# Patient Record
Sex: Female | Born: 1990 | Race: Black or African American | Hispanic: No | Marital: Single | Smoking: Former smoker
Health system: Southern US, Community
[De-identification: ages and names within clinical notes are randomized; demographics above are authoritative.]

## PROBLEM LIST (undated history)

## (undated) ENCOUNTER — Inpatient Hospital Stay (HOSPITAL_COMMUNITY): Payer: Self-pay

## (undated) ENCOUNTER — Emergency Department (HOSPITAL_COMMUNITY): Admission: EM | Payer: Medicaid Other | Source: Home / Self Care

## (undated) DIAGNOSIS — R12 Heartburn: Secondary | ICD-10-CM

## (undated) DIAGNOSIS — E079 Disorder of thyroid, unspecified: Secondary | ICD-10-CM

## (undated) DIAGNOSIS — R42 Dizziness and giddiness: Secondary | ICD-10-CM

## (undated) DIAGNOSIS — R569 Unspecified convulsions: Secondary | ICD-10-CM

## (undated) DIAGNOSIS — O139 Gestational [pregnancy-induced] hypertension without significant proteinuria, unspecified trimester: Secondary | ICD-10-CM

## (undated) DIAGNOSIS — A6000 Herpesviral infection of urogenital system, unspecified: Secondary | ICD-10-CM

## (undated) DIAGNOSIS — K5790 Diverticulosis of intestine, part unspecified, without perforation or abscess without bleeding: Secondary | ICD-10-CM

## (undated) DIAGNOSIS — M549 Dorsalgia, unspecified: Secondary | ICD-10-CM

## (undated) DIAGNOSIS — O26899 Other specified pregnancy related conditions, unspecified trimester: Secondary | ICD-10-CM

## (undated) DIAGNOSIS — D649 Anemia, unspecified: Secondary | ICD-10-CM

## (undated) DIAGNOSIS — O00109 Unspecified tubal pregnancy without intrauterine pregnancy: Secondary | ICD-10-CM

## (undated) HISTORY — DX: Unspecified tubal pregnancy without intrauterine pregnancy: O00.109

---
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
## 2010-02-02 ENCOUNTER — Emergency Department: Payer: Self-pay | Admitting: Emergency Medicine

## 2010-04-08 ENCOUNTER — Emergency Department (HOSPITAL_COMMUNITY): Admission: EM | Admit: 2010-04-08 | Discharge: 2010-04-08 | Payer: Self-pay | Admitting: Emergency Medicine

## 2010-09-15 LAB — POCT I-STAT, CHEM 8
BUN: 8 mg/dL (ref 6–23)
Calcium, Ion: 1.13 mmol/L (ref 1.12–1.32)
Chloride: 106 mEq/L (ref 96–112)
Creatinine, Ser: 0.9 mg/dL (ref 0.4–1.2)
Glucose, Bld: 89 mg/dL (ref 70–99)
HCT: 40 % (ref 36.0–46.0)
Hemoglobin: 13.6 g/dL (ref 12.0–15.0)
Potassium: 3.7 mEq/L (ref 3.5–5.1)
Sodium: 138 mEq/L (ref 135–145)
TCO2: 23 mmol/L (ref 0–100)

## 2010-09-15 LAB — POCT PREGNANCY, URINE: Preg Test, Ur: NEGATIVE

## 2010-09-15 LAB — D-DIMER, QUANTITATIVE: D-Dimer, Quant: <0.22 ug/mL-FEU (ref 0.00–0.48)

## 2010-11-15 NOTE — Op Note (Signed)
NAME:  Joann King, Joann King NO.:  [id number]   MEDICAL RECORD NO.:  [id number]          PATIENT TYPE:  INP   LOCATION:                                FACILITY:  [facility]   PHYSICIAN:  Phil D. Okey Dupre, M.D.     DATE OF BIRTH:  Feb 06, 1991   DATE OF PROCEDURE:  10/13/2007  DATE OF DISCHARGE:                               OPERATIVE REPORT   PREOPERATIVE DIAGNOSES:  1. Intrauterine pregnancy at 41 weeks 1 day gestation.  2. Cephalopelvic disproportion.  3. Arrest of labor.  4. Meconium stained amniotic fluid.   POSTOPERATIVE DIAGNOSES:  1. Intrauterine pregnancy at 41 weeks 1 day gestation.  2. Cephalopelvic disproportion.  3. Arrest of labor.  4. Meconium stained amniotic fluid.   PROCEDURE:  Primary low transverse cesarean section.   SURGEON:  Javier Glazier. Okey Dupre, MD   ASSISTANT:  Karlton Lemon, MD   ANESTHESIA:  Epidural.   FINDINGS:  1. Viable infant female weighing 7 pounds 12 ounces with Apgars 8 at      one minute and 9 at five minutes in vertex presentation.  2. Meconium stained amniotic fluid.  3. Normal female pelvic anatomy.   ESTIMATED BLOOD LOSS:  Six hundred mL.   DRAINS:  Foley with clear yellow urine.   COMPLICATIONS:  None immediate.   SPECIMENS:  1. Placenta to labor and delivery.  2. Cord pH 7.22.  3. Cord blood to cord bank.   INDICATIONS FOR PROCEDURE:  Ms. Kattner is a 20 year old gravida 1, para 0  at 41 weeks 1 day gestation that presented in spontaneous labor.  She  progressed to 9 cm dilated and remained that way for 5 plus hours.  She  is adequate on her [place] units after an IUPC was placed for almost  an hour.  She was on Pitocin at the time of decision to go for cesarean  section.   DESCRIPTION OF PROCEDURE:  The patient was taken to the operating room  and was prepped and draped in the usual sterile manner in the supine  position with left lateral uterine displacement.  After ensuring  adequate anesthesia a Pfannenstiel skin  incision was made using a  scalpel.  Incision was carried down through the subcutaneous tissues  using the scalpel.  The rectus fascia was nicked in the midline and the  incision was extended laterally in each direction using the Mayo  scissors.  The rectus muscles were dissected free of the fascia using  both sharp and blunt dissection.  The rectus muscles were then separated  bluntly.  The parietal peritoneum was identified, grasped between two  hemostats, elevated and entered sharply under direct visualization with  Metzenbaum scissors.  A bladder blade was then placed and reflection of  the visceral peritoneum superior to the bladder was identified, elevated  and incised using the Metzenbaum scissors.  The incision was then  extended laterally in each direction.  The bladder flap was created  using blunt dissection and retracted with a bladder blade.  A low  transverse uterine incision was made using a scalpel and  the incision  was extended laterally and superiorly using blunt dissection.  A hand  was placed in the uterine cavity but it was difficult to elevate the  head.  An assistant from below applied upward force to the infant's head  which helped elevate it.  The head was then elevated and flexed with my  hand and delivered without difficulty.  The infant was bulb suctioned  after the delivery of the head.  The body and the infant was then  delivered without difficulty.  Cord was doubly clamped and cut and the  infant handed to the nursery team in attendance.  Specimen was collected  for cord pH.  Cord bloods was to be collected by the cord bank.  The  placenta was then delivered manually and appeared intact.  The  endometrial cavity was then wiped free of any traces of membranes using  wet laparotomy sponges.  The edges of the uterine incision were grasped  using ring clamps and the uterine incision was closed in two layers the  first being running locking stitch of 0 Vicryl and  the second being a  running imbricating stitch of 0 Vicryl.  The uterine incision was  inspected and found to have adequate hemostasis.  The operative site was  then irrigated with copious amounts of normal saline.  The uterine  incision was inspected once again and found to have adequate hemostasis.  The rectus muscles were inspected and small areas of bleeding were  controlled using the Bovie cautery.  The rectus fascia was  reapproximated with one suture of 0 Vicryl in a running unlocked  fashion.  Small areas of bleeding in the subcutaneous tissue were  controlled using the Bovie cautery.  The skin was reapproximated with  stainless steel skin staples.  Sponge, needle and instrument counts were  correct x3.  The patient tolerated the procedure well and went to the  post anesthesia care unit in stable condition.      Karlton Lemon, MD  Electronically Signed     ______________________________  Javier Glazier. Okey Dupre, M.D.    NS/MEDQ  D:  10/13/2007  T:  10/13/2007  Job:  [id number]

## 2010-11-15 NOTE — Discharge Summary (Signed)
Joann King, Joann King                ACCOUNT NO.:  [id number]   MEDICAL RECORD NO.:  [id number]          PATIENT TYPE:  INP   LOCATION:  9123                          [facility]ACILITY:  [facility]   PHYSICIAN:  Phil D. Okey Dupre, M.D.     DATE OF BIRTH:  07-27-1990   DATE OF ADMISSION:  10/12/2007  DATE OF DISCHARGE:                               DISCHARGE SUMMARY   REASON FOR ADMISSION:  Pregnancy at term in labor.   PROCEDURE:  Joann King had a low-transverse cesarean section for  cephalopelvic disproportion.   POSTOPERATIVE DIAGNOSIS:  A low-transverse cesarean section for  cephalopelvic disproportion.   POSTPARTUM COURSE:  Uneventful.  The patient has been ambulating well,  taking p.o. fluids, and solids well without complications.   PHYSICAL EXAMINATION:  VITAL SIGNS:  Stable.  Hemoglobin was 9,  hematocrit 26.  HEART:  Heart rate is regular to rhythm and rate.  LUNGS:  Clear to auscultation bilaterally.  ABDOMEN:  Soft and nontender.  Bowel sounds are present in all 4  quadrants.  Incision is intact.  No redness.  No swelling.  No drainage.  Fundus is firm.  Lochia scant amount.  EXTREMITIES:  There is trace edema in the lower extremities.   ASSESSMENT:  Stable on postop day 3 and anemia.   PLAN:  To go ahead and discharge her home.   DISCHARGE MEDICATIONS:  As follows,  1. Chromagen Forte 1 p.o. b.i.d.  2. Motrin  600 one q.6 h. p.r.n. cramping  3. Lortab 5/325 one p.o. q.4 h. p.r.n. pain.   Baby love nurse is to take out her staples on postop day 5 through 7.      Zerita Boers, N.M.      Phil D. Okey Dupre, M.D.  Electronically Signed    DL/MEDQ  D:  16/04/9603  T:  10/15/2007  Job:  [id number]

## 2011-03-23 LAB — POCT URINALYSIS DIP (DEVICE)
Bilirubin Urine: NEGATIVE
Bilirubin Urine: NEGATIVE
Glucose, UA: NEGATIVE
Glucose, UA: NEGATIVE
Hgb urine dipstick: NEGATIVE
Hgb urine dipstick: NEGATIVE
Ketones, ur: NEGATIVE
Ketones, ur: NEGATIVE
Nitrite: NEGATIVE
Nitrite: NEGATIVE
Operator id: 159681
Operator id: 297281
Protein, ur: NEGATIVE
Protein, ur: NEGATIVE
Specific Gravity, Urine: 1.015
Specific Gravity, Urine: 1.02
Urobilinogen, UA: 0.2
Urobilinogen, UA: 0.2
pH: 7.5
pH: 7

## 2011-03-24 LAB — URINALYSIS, ROUTINE W REFLEX MICROSCOPIC
Bilirubin Urine: NEGATIVE
Glucose, UA: NEGATIVE
Ketones, ur: NEGATIVE
Leukocytes, UA: NEGATIVE
Nitrite: NEGATIVE
Protein, ur: NEGATIVE
Specific Gravity, Urine: 1.015
Urobilinogen, UA: 0.2
pH: 6

## 2011-03-24 LAB — POCT URINALYSIS DIP (DEVICE)
Bilirubin Urine: NEGATIVE
Bilirubin Urine: NEGATIVE
Glucose, UA: NEGATIVE
Glucose, UA: NEGATIVE
Hgb urine dipstick: NEGATIVE
Hgb urine dipstick: NEGATIVE
Ketones, ur: NEGATIVE
Ketones, ur: NEGATIVE
Nitrite: NEGATIVE
Nitrite: NEGATIVE
Operator id: 297281
Operator id: 297281
Protein, ur: 30 — AB
Protein, ur: NEGATIVE
Specific Gravity, Urine: 1.02
Specific Gravity, Urine: 1.015
Urobilinogen, UA: 0.2
Urobilinogen, UA: 0.2
pH: 7
pH: 7

## 2011-03-24 LAB — URINE MICROSCOPIC-ADD ON

## 2011-03-24 LAB — GC/CHLAMYDIA PROBE AMP, GENITAL
Chlamydia, DNA Probe: NEGATIVE
GC Probe Amp, Genital: NEGATIVE

## 2011-03-24 LAB — WET PREP, GENITAL
Clue Cells Wet Prep HPF POC: NONE SEEN
Trich, Wet Prep: NONE SEEN
Yeast Wet Prep HPF POC: NONE SEEN

## 2011-03-27 LAB — POCT URINALYSIS DIP (DEVICE)
Bilirubin Urine: NEGATIVE
Bilirubin Urine: NEGATIVE
Bilirubin Urine: NEGATIVE
Glucose, UA: NEGATIVE
Glucose, UA: NEGATIVE
Glucose, UA: NEGATIVE
Hgb urine dipstick: NEGATIVE
Hgb urine dipstick: NEGATIVE
Hgb urine dipstick: NEGATIVE
Ketones, ur: NEGATIVE
Ketones, ur: NEGATIVE
Ketones, ur: NEGATIVE
Nitrite: NEGATIVE
Nitrite: NEGATIVE
Nitrite: NEGATIVE
Operator id: 148111
Operator id: 120861
Operator id: 148111
Operator id: 297281
Protein, ur: 30 — AB
Protein, ur: 30 — AB
Protein, ur: NEGATIVE
Specific Gravity, Urine: 1.015
Specific Gravity, Urine: 1.01
Specific Gravity, Urine: 1.015
Specific Gravity, Urine: <=1.005
Urobilinogen, UA: 0.2
Urobilinogen, UA: 0.2
Urobilinogen, UA: 0.2
Urobilinogen, UA: 0.2
pH: 7
pH: 6.5
pH: 7
pH: 7.5

## 2011-03-28 LAB — CBC
HCT: 26.3 — ABNORMAL LOW
HCT: 32 — ABNORMAL LOW
Hemoglobin: 11 — ABNORMAL LOW
Hemoglobin: 9 — ABNORMAL LOW
MCHC: 34.3
MCHC: 34.5
MCV: 84.6
MCV: 85.3
Platelets: 307
Platelets: 388
RBC: 3.09 — ABNORMAL LOW
RBC: 3.78 — ABNORMAL LOW
RDW: 14.6
RDW: 14.8
WBC: 13.3
WBC: 13.9 — ABNORMAL HIGH

## 2011-03-28 LAB — POCT URINALYSIS DIP (DEVICE)
Bilirubin Urine: NEGATIVE
Bilirubin Urine: NEGATIVE
Glucose, UA: NEGATIVE
Glucose, UA: NEGATIVE
Hgb urine dipstick: NEGATIVE
Hgb urine dipstick: NEGATIVE
Ketones, ur: NEGATIVE
Ketones, ur: NEGATIVE
Nitrite: NEGATIVE
Nitrite: NEGATIVE
Operator id: 120861
Operator id: 159681
Protein, ur: 30 — AB
Protein, ur: NEGATIVE
Specific Gravity, Urine: 1.015
Specific Gravity, Urine: 1.015
Urobilinogen, UA: 0.2
Urobilinogen, UA: 0.2
pH: 7
pH: 7.5

## 2011-03-28 LAB — WOUND CULTURE: Gram Stain: NONE SEEN

## 2011-03-28 LAB — RPR: RPR Ser Ql: NONREACTIVE

## 2011-04-07 LAB — POCT URINALYSIS DIP (DEVICE)
Bilirubin Urine: NEGATIVE
Glucose, UA: NEGATIVE
Hgb urine dipstick: NEGATIVE
Ketones, ur: NEGATIVE
Nitrite: NEGATIVE
Operator id: 120861
Protein, ur: 30 — AB
Specific Gravity, Urine: 1.02
Urobilinogen, UA: 1
pH: 7

## 2011-04-10 LAB — POCT URINALYSIS DIP (DEVICE)
Bilirubin Urine: NEGATIVE
Glucose, UA: NEGATIVE
Hgb urine dipstick: NEGATIVE
Ketones, ur: NEGATIVE
Nitrite: NEGATIVE
Operator id: 297281
Protein, ur: NEGATIVE
Specific Gravity, Urine: 1.02
Urobilinogen, UA: 0.2
pH: 7

## 2011-04-10 LAB — WET PREP, GENITAL
Trich, Wet Prep: NONE SEEN
Yeast Wet Prep HPF POC: NONE SEEN

## 2011-04-11 LAB — GC/CHLAMYDIA PROBE AMP, GENITAL
Chlamydia, DNA Probe: POSITIVE — AB
GC Probe Amp, Genital: POSITIVE — AB

## 2011-04-11 LAB — URINALYSIS, ROUTINE W REFLEX MICROSCOPIC
Bilirubin Urine: NEGATIVE
Glucose, UA: NEGATIVE
Ketones, ur: NEGATIVE
Nitrite: NEGATIVE
Protein, ur: NEGATIVE
Specific Gravity, Urine: 1.01
Urobilinogen, UA: 0.2
pH: 7.5

## 2011-04-11 LAB — WET PREP, GENITAL: Trich, Wet Prep: NONE SEEN

## 2011-04-11 LAB — CBC
HCT: 30.8 — ABNORMAL LOW
Hemoglobin: 10.6 — ABNORMAL LOW
MCHC: 34.5
Platelets: 343
RDW: 13.6

## 2011-04-11 LAB — RUBELLA SCREEN: Rubella: 94 — ABNORMAL HIGH

## 2011-04-11 LAB — URINE MICROSCOPIC-ADD ON

## 2011-04-11 LAB — DIFFERENTIAL
Basophils Absolute: 0.1
Basophils Relative: 1
Eosinophils Absolute: 0.1 — ABNORMAL LOW
Eosinophils Relative: 1
Monocytes Absolute: 0.4

## 2011-04-11 LAB — HEPATITIS B SURFACE ANTIGEN: Hepatitis B Surface Ag: NEGATIVE

## 2012-01-11 ENCOUNTER — Emergency Department (HOSPITAL_COMMUNITY)
Admission: EM | Admit: 2012-01-11 | Discharge: 2012-01-11 | Disposition: A | Discharge status: Home / Self Care | Payer: Self-pay | Attending: Emergency Medicine | Admitting: Emergency Medicine

## 2012-01-11 ENCOUNTER — Encounter (HOSPITAL_COMMUNITY): Payer: Self-pay | Admitting: Emergency Medicine

## 2012-01-11 DIAGNOSIS — K0889 Other specified disorders of teeth and supporting structures: Secondary | ICD-10-CM

## 2012-01-11 DIAGNOSIS — K089 Disorder of teeth and supporting structures, unspecified: Secondary | ICD-10-CM | POA: Insufficient documentation

## 2012-01-11 DIAGNOSIS — F17200 Nicotine dependence, unspecified, uncomplicated: Secondary | ICD-10-CM | POA: Insufficient documentation

## 2012-01-11 HISTORY — DX: Herpesviral infection of urogenital system, unspecified: A60.00

## 2012-01-11 MED ORDER — PENICILLIN V POTASSIUM 500 MG PO TABS: 500.0000 mg | ORAL_TABLET | Freq: Four times a day (QID) | ORAL | Status: AC

## 2012-01-11 MED ORDER — IBUPROFEN 400 MG PO TABS
800.0000 mg | ORAL_TABLET | Freq: Once | ORAL | Status: AC
  Administered 2012-01-11: 800 mg via ORAL

## 2012-01-11 MED ORDER — IBUPROFEN 400 MG PO TABS
ORAL_TABLET | ORAL | Status: AC
  Filled 2012-01-11: qty 2

## 2012-01-11 MED ORDER — HYDROCODONE-ACETAMINOPHEN 5-325 MG PO TABS
1.0000 | ORAL_TABLET | Freq: Once | ORAL | Status: AC
  Administered 2012-01-11: 1 via ORAL

## 2012-01-11 MED ORDER — IBUPROFEN 800 MG PO TABS: 800.0000 mg | ORAL_TABLET | Freq: Three times a day (TID) | ORAL | Status: AC | PRN

## 2012-01-11 MED ORDER — HYDROCODONE-ACETAMINOPHEN 5-325 MG PO TABS: 1.0000 | ORAL_TABLET | Freq: Four times a day (QID) | ORAL | Status: AC | PRN

## 2012-01-11 MED ORDER — HYDROCODONE-ACETAMINOPHEN 5-325 MG PO TABS
ORAL_TABLET | ORAL | Status: AC
  Filled 2012-01-11: qty 1

## 2012-01-11 NOTE — ED Notes (Signed)
Patient with left sided face pain, throbbing in upper jaw on left.

## 2012-01-11 NOTE — ED Provider Notes (Signed)
History     CSN: [id number]  Arrival date & time 01/11/12  2222   First MD Initiated Contact with Patient 01/11/12 2246      Chief Complaint  Patient presents with  . Dental Pain    (Consider location/radiation/quality/duration/timing/severity/associated sxs/prior treatment) HPI Patient presents emergency room with left-sided dental pain.  Patient, states that both her upper and lower teeth are sensitive and painful.  States, that she has had multiple cavities filled on the side of her mouth.  Patient, states palpation on her face the pain worse.  Patient denies taking anything prior to arrival for her pain Past Medical History  Diagnosis Date  . Herpes genitalia     History reviewed. No pertinent past surgical history.  History reviewed. No pertinent family history.  History  Substance Use Topics  . Smoking status: Current Everyday Smoker  . Smokeless tobacco: Not on file  . Alcohol Use: No    OB History    Grav Para Term Preterm Abortions TAB SAB Ect Mult Living                  Review of Systems All other systems negative except as documented in the HPI. All pertinent positives and negatives as reviewed in the HPI.  Allergies  Review of patient's allergies indicates no known allergies.  Home Medications  No current outpatient prescriptions on file.  BP 131/80  Pulse 84  Temp 98.1 F (36.7 C) (Oral)  Resp 18  SpO2 99%  LMP 01/10/2012  Physical Exam  Nursing note and vitals reviewed. Constitutional: She appears well-developed and well-nourished.  HENT:  Head:    Neck: Normal range of motion. Neck supple.  Lymphadenopathy:    She has no cervical adenopathy.    ED Course  Procedures (including critical care time)  Patient be referred to dentistry.  She is told to return here for any worsening in her condition.    MDM          Carlyle Dolly, PA-C 01/11/12 [phone]

## 2012-01-12 NOTE — ED Provider Notes (Signed)
Medical screening examination/treatment/procedure(s) were performed by non-physician practitioner and as supervising physician I was immediately available for consultation/collaboration.   Yarnell Kozloski, MD 01/12/12 0650 

## 2013-02-15 ENCOUNTER — Encounter (HOSPITAL_COMMUNITY): Payer: Self-pay | Admitting: *Deleted

## 2013-02-15 ENCOUNTER — Inpatient Hospital Stay (HOSPITAL_COMMUNITY)
Admission: AD | Admit: 2013-02-15 | Discharge: 2013-02-15 | Disposition: A | Discharge status: Home / Self Care | Payer: Medicaid Other | Source: Ambulatory Visit | Attending: Obstetrics & Gynecology | Admitting: Obstetrics & Gynecology

## 2013-02-15 DIAGNOSIS — Z3201 Encounter for pregnancy test, result positive: Secondary | ICD-10-CM

## 2013-02-15 LAB — POCT PREGNANCY, URINE: Preg Test, Ur: POSITIVE — AB

## 2013-02-15 NOTE — MAU Provider Note (Signed)
  History     CSN: [id number]  Arrival date and time: 02/15/13 1501   First Provider Initiated Contact with Patient 02/15/13 1539      Chief Complaint  Patient presents with  . Possible Pregnancy   HPI Joann King 21 y.o. [redacted]w[redacted]d  Comes to MAU for pregnancy verification.  No other complaints.  OB History   Grav Para Term Preterm Abortions TAB SAB Ect Mult Living   1               Past Medical History  Diagnosis Date  . Herpes genitalia     No past surgical history on file.  No family history on file.  History  Substance Use Topics  . Smoking status: Current Every Day Smoker  . Smokeless tobacco: Not on file  . Alcohol Use: No    Allergies: No Known Allergies  No prescriptions prior to admission    Review of Systems  Constitutional: Negative for fever.  Gastrointestinal: Negative for nausea, vomiting and abdominal pain.  Genitourinary:       No vaginal discharge. No vaginal bleeding. No dysuria.   Physical Exam   Blood pressure 120/63, pulse 75, temperature 98.1 F (36.7 C), temperature source Oral, resp. rate 18, height 5\' 6"  (1.676 m), weight 186 lb 9.6 oz (84.641 kg), last menstrual period 01/09/2013.  Physical Exam  Nursing note and vitals reviewed. Constitutional: She is oriented to person, place, and time. She appears well-developed and well-nourished.  HENT:  Head: Normocephalic.  Eyes: EOM are normal.  Neck: Neck supple.  Musculoskeletal: Normal range of motion.  Neurological: She is alert and oriented to person, place, and time.  Skin: Skin is warm and dry.  Psychiatric: She has a normal mood and affect.    MAU Course  Procedures  MDM Results for orders placed during the hospital encounter of 02/15/13 (from the past 24 hour(s))  POCT PREGNANCY, URINE     Status: Abnormal   Collection Time    02/15/13  3:19 PM      Result Value Range   Preg Test, Ur POSITIVE (*) NEGATIVE     Assessment and Plan  Positive pregnancy  test  Plan Your pregnancy test is positive.  No smoking, no drugs, no alcohol.  Take a prenatal vitamin one by mouth every day.  Eat small frequent snacks to avoid nausea.  Begin prenatal care as soon as possible.  Glenroy Crossen 02/15/2013, 3:40 PM

## 2013-02-15 NOTE — MAU Note (Signed)
Pt just wants to confirm that she is pregnant. No other complaints.

## 2013-02-24 ENCOUNTER — Emergency Department (HOSPITAL_COMMUNITY)
Admission: EM | Admit: 2013-02-24 | Discharge: 2013-02-24 | Discharge status: Against Medical Advice | Payer: Medicaid Other | Attending: Emergency Medicine | Admitting: Emergency Medicine

## 2013-02-24 ENCOUNTER — Emergency Department (HOSPITAL_COMMUNITY): Payer: Medicaid Other

## 2013-02-24 ENCOUNTER — Encounter (HOSPITAL_COMMUNITY): Payer: Self-pay | Admitting: Emergency Medicine

## 2013-02-24 DIAGNOSIS — O99330 Smoking (tobacco) complicating pregnancy, unspecified trimester: Secondary | ICD-10-CM | POA: Insufficient documentation

## 2013-02-24 DIAGNOSIS — IMO0002 Reserved for concepts with insufficient information to code with codable children: Secondary | ICD-10-CM | POA: Insufficient documentation

## 2013-02-24 DIAGNOSIS — O9989 Other specified diseases and conditions complicating pregnancy, childbirth and the puerperium: Secondary | ICD-10-CM | POA: Insufficient documentation

## 2013-02-24 DIAGNOSIS — S6990XA Unspecified injury of unspecified wrist, hand and finger(s), initial encounter: Secondary | ICD-10-CM | POA: Insufficient documentation

## 2013-02-24 NOTE — ED Notes (Signed)
Pt decided she did not want to wait to be seen.  LWOT

## 2013-02-24 NOTE — ED Notes (Signed)
Above note by Paulene Floor RN supervised by Gennie Alma RN
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
## 2013-02-28 ENCOUNTER — Encounter (HOSPITAL_COMMUNITY): Payer: Self-pay | Admitting: *Deleted

## 2013-02-28 ENCOUNTER — Emergency Department (HOSPITAL_COMMUNITY)
Admission: EM | Admit: 2013-02-28 | Discharge: 2013-02-28 | Disposition: A | Discharge status: Home / Self Care | Payer: Medicaid Other | Source: Home / Self Care | Attending: Family Medicine | Admitting: Family Medicine

## 2013-02-28 DIAGNOSIS — N760 Acute vaginitis: Secondary | ICD-10-CM

## 2013-02-28 MED ORDER — NYSTATIN 100000 UNIT/GM EX CREA: TOPICAL_CREAM | CUTANEOUS | Status: DC

## 2013-02-28 NOTE — ED Notes (Signed)
Pt  Saws  She  Is  About  6  Weeks  preg     She  States  She  Is  On  Prenatal  Vitamins  But  Has  Not  Had  Any  Prenatal  Care           She  Reports    Some  Itching  And  Irritation in  Vaginal  Area   She  Denies   Any  Cramping or  Bleeding    She  Appears  In no  Acute  Distress    Sitting  Upright  On  Exam table  No  Distress

## 2013-02-28 NOTE — ED Provider Notes (Signed)
CSN: [id number]     Arrival date & time 02/28/13  1120 History   First MD Initiated Contact with Patient 02/28/13 1137     Chief Complaint  Patient presents with  . Vaginal Itching   (Consider location/radiation/quality/duration/timing/severity/associated sxs/prior Treatment) HPI Comments: 22 year old female G2 P2 001 as per her report. Currently in her first trimester of pregnancy. Here complaining of the low vaginal itchiness since last night. Denies burning on urination or urinary frequency. Denies abdominal pain or pelvic pain or pressure. Denies vaginal discharge or vaginal bleeding. Patient does have a history of genital herpes and has taken those likely be her prophylactically for long time states that she ran out about a week ago. She has a OB appointment at [facility] hospital in October. Reports otherwise doing well.   Past Medical History  Diagnosis Date  . Herpes genitalia    History reviewed. No pertinent past surgical history. History reviewed. No pertinent family history. History  Substance Use Topics  . Smoking status: Current Every Day Smoker  . Smokeless tobacco: Not on file  . Alcohol Use: No   OB History   Grav Para Term Preterm Abortions TAB SAB Ect Mult Living   1              Review of Systems  Constitutional: Negative for fever, chills, diaphoresis, activity change, appetite change and fatigue.  Gastrointestinal: Negative for nausea, vomiting, abdominal pain and diarrhea.  Genitourinary: Negative for dysuria, frequency, hematuria, flank pain, vaginal bleeding, vaginal discharge, difficulty urinating and pelvic pain.  Neurological: Negative for dizziness and headaches.  All other systems reviewed and are negative.    Allergies  Review of patient's allergies indicates no known allergies.  Home Medications   Current Outpatient Rx  Name  Route  Sig  Dispense  Refill  . nystatin cream (MYCOSTATIN)      Apply to affected area 2 times daily   15 g   0      BP 125/53  Pulse 79  Temp(Src) 98.7 F (37.1 C) (Oral)  Resp 18  SpO2 100%  LMP 01/09/2013 Physical Exam  Vitals reviewed. Constitutional: She is oriented to person, place, and time. She appears well-developed and well-nourished. No distress.  HENT:  Head: Normocephalic and atraumatic.  Eyes: No scleral icterus.  Cardiovascular: Normal heart sounds.   Pulmonary/Chest: Breath sounds normal.  Abdominal: There is no tenderness.  Genitourinary:    There is rash on the right labia. There is rash on the left labia. There is erythema around the vagina. No vaginal discharge found.  Lymphadenopathy:    She has no cervical adenopathy.  Neurological: She is alert and oriented to person, place, and time.  Skin: No rash noted. She is not diaphoretic.    ED Course  Procedures (including critical care time) Labs Review Labs Reviewed - No data to display Imaging Review No results found.  MDM   1. Vulvovaginitis    Treated with nystatin cream. Supportive care and red flags that should prompt her return to medical attention discussed with patient and provided in writing.  Sharin Grave, MD 02/28/13 1227

## 2013-03-05 ENCOUNTER — Encounter (HOSPITAL_COMMUNITY): Payer: Self-pay

## 2013-03-05 ENCOUNTER — Inpatient Hospital Stay (HOSPITAL_COMMUNITY)
Admission: AD | Admit: 2013-03-05 | Discharge: 2013-03-05 | Disposition: A | Discharge status: Home / Self Care | Payer: Medicaid Other | Source: Ambulatory Visit | Attending: Obstetrics & Gynecology | Admitting: Obstetrics & Gynecology

## 2013-03-05 DIAGNOSIS — O219 Vomiting of pregnancy, unspecified: Secondary | ICD-10-CM

## 2013-03-05 DIAGNOSIS — R42 Dizziness and giddiness: Secondary | ICD-10-CM | POA: Insufficient documentation

## 2013-03-05 DIAGNOSIS — O210 Mild hyperemesis gravidarum: Secondary | ICD-10-CM | POA: Insufficient documentation

## 2013-03-05 HISTORY — DX: Anemia, unspecified: D64.9

## 2013-03-05 LAB — COMPREHENSIVE METABOLIC PANEL
AST: 17 U/L (ref 0–37)
Albumin: 4 g/dL (ref 3.5–5.2)
Alkaline Phosphatase: 56 U/L (ref 39–117)
BUN: 8 mg/dL (ref 6–23)
Chloride: 102 mEq/L (ref 96–112)
Potassium: 4 mEq/L (ref 3.5–5.1)
Total Bilirubin: 0.6 mg/dL (ref 0.3–1.2)

## 2013-03-05 LAB — URINE MICROSCOPIC-ADD ON

## 2013-03-05 LAB — CBC
HCT: 37.1 % (ref 36.0–46.0)
Platelets: 309 10*3/uL (ref 150–400)
RBC: 4.26 MIL/uL (ref 3.87–5.11)
RDW: 12.9 % (ref 11.5–15.5)
WBC: 9.1 10*3/uL (ref 4.0–10.5)

## 2013-03-05 LAB — URINALYSIS, ROUTINE W REFLEX MICROSCOPIC
Ketones, ur: 15 mg/dL — AB
Nitrite: NEGATIVE
Specific Gravity, Urine: 1.015 (ref 1.005–1.030)
pH: 8 (ref 5.0–8.0)

## 2013-03-05 MED ORDER — ONDANSETRON HCL 4 MG PO TABS: 4.0000 mg | ORAL_TABLET | Freq: Three times a day (TID) | ORAL | Status: DC | PRN

## 2013-03-05 MED ORDER — ONDANSETRON 8 MG PO TBDP
8.0000 mg | ORAL_TABLET | Freq: Once | ORAL | Status: AC
  Administered 2013-03-05: 8 mg via ORAL
  Filled 2013-03-05: qty 1

## 2013-03-05 NOTE — MAU Provider Note (Signed)
Chief Complaint: Dizziness, Nausea and Emesis  First Provider Initiated Contact with Patient 03/05/13 2330     SUBJECTIVE HPI: Joann King is a 22 y.o. G2P1001 at [redacted]w[redacted]d by LMP who presents to maternity admissions by EMS with nausea, vomiting and dizziness times a few days. Denies syncope. Has not started prenatal care. Has not taken anything for the nausea and vomiting. States she vomited approximately 7 times in the past 24 hours has been able to keep down food and fluids. Wants a prescription for an antiemetic.  Past Medical History  Diagnosis Date  . Herpes genitalia     doesn't think ever had outbreak  . Anemia     during first pregnancy   OB History  Gravida Para Term Preterm AB SAB TAB Ectopic Multiple Living  2 1 1       1     # Outcome Date GA Lbr Len/2nd Weight Sex Delivery Anes PTL Lv  2 CUR           1 TRM 10/12/07     LTCS   Y     Comments: c/section for CPD     Past Surgical History  Procedure Laterality Date  . Cesarean section     History   Social History  . Marital Status: Single    Spouse Name: N/A    Number of Children: N/A  . Years of Education: N/A   Occupational History  . Not on file.   Social History Main Topics  . Smoking status: Never Smoker   . Smokeless tobacco: Not on file  . Alcohol Use: No  . Drug Use: No  . Sexual Activity: Yes   Other Topics Concern  . Not on file   Social History Narrative  . No narrative on file   No current facility-administered medications on file prior to encounter.   Current Outpatient Prescriptions on File Prior to Encounter  Medication Sig Dispense Refill  . nystatin cream (MYCOSTATIN) Apply to affected area 2 times daily  15 g  0   No Known Allergies  ROS: Pertinent items in HPI. Negative for fever, chills, abdominal pain, vaginal bleeding, vaginal discharge, chest pain, shortness of breath, palpitations, headache or syncope.  OBJECTIVE Blood pressure 124/58, pulse 79, resp. rate 16, height 5\' 6"   (1.676 m), weight 80.91 kg (178 lb 6 oz), last menstrual period 01/09/2013, SpO2 100.00%. GENERAL: Well-developed, well-nourished female in no acute distress. Ambulating without difficulty. HEENT: Normocephalic HEART: normal rate RESP: normal effort ABDOMEN: Soft, non-tender NEURO: Alert and oriented SPECULUM EXAM: Deferred  LAB RESULTS Results for orders placed during the hospital encounter of 03/05/13 (from the past 24 hour(s))  URINALYSIS, ROUTINE W REFLEX MICROSCOPIC     Status: Abnormal   Collection Time    03/05/13  8:35 PM      Result Value Range   Color, Urine YELLOW  YELLOW   APPearance CLEAR  CLEAR   Specific Gravity, Urine 1.015  1.005 - 1.030   pH 8.0  5.0 - 8.0   Glucose, UA NEGATIVE  NEGATIVE mg/dL   Hgb urine dipstick TRACE (*) NEGATIVE   Bilirubin Urine NEGATIVE  NEGATIVE   Ketones, ur 15 (*) NEGATIVE mg/dL   Protein, ur NEGATIVE  NEGATIVE mg/dL   Urobilinogen, UA 0.2  0.0 - 1.0 mg/dL   Nitrite NEGATIVE  NEGATIVE   Leukocytes, UA SMALL (*) NEGATIVE  URINE MICROSCOPIC-ADD ON     Status: Abnormal   Collection Time    03/05/13  8:35  PM      Result Value Range   Squamous Epithelial / LPF FEW (*) RARE   WBC, UA 3-6  <3 WBC/hpf   RBC / HPF 3-6  <3 RBC/hpf   Bacteria, UA FEW (*) RARE  CBC     Status: None   Collection Time    03/05/13 10:40 PM      Result Value Range   WBC 9.1  4.0 - 10.5 K/uL   RBC 4.26  3.87 - 5.11 MIL/uL   Hemoglobin 13.0  12.0 - 15.0 g/dL   HCT 16.1  09.6 - 04.5 %   MCV 87.1  78.0 - 100.0 fL   MCH 30.5  26.0 - 34.0 pg   MCHC 35.0  30.0 - 36.0 g/dL   RDW 40.9  81.1 - 91.4 %   Platelets 309  150 - 400 K/uL  COMPREHENSIVE METABOLIC PANEL     Status: None   Collection Time    03/05/13 10:40 PM      Result Value Range   Sodium 137  135 - 145 mEq/L   Potassium 4.0  3.5 - 5.1 mEq/L   Chloride 102  96 - 112 mEq/L   CO2 23  19 - 32 mEq/L   Glucose, Bld 77  70 - 99 mg/dL   BUN 8  6 - 23 mg/dL   Creatinine, Ser 7.82  0.50 - 1.10 mg/dL    Calcium 95.6  8.4 - 10.5 mg/dL   Total Protein 7.4  6.0 - 8.3 g/dL   Albumin 4.0  3.5 - 5.2 g/dL   AST 17  0 - 37 U/L   ALT 12  0 - 35 U/L   Alkaline Phosphatase 56  39 - 117 U/L   Total Bilirubin 0.6  0.3 - 1.2 mg/dL   GFR calc non Af Amer >90  >90 mL/min   GFR calc Af Amer >90  >90 mL/min    IMAGING N/A  MAU COURSE Zofran given. Patient keeping down liquids while in MAU. No vomiting. Requesting discharge without waiting to see if Zofran helps. No dizziness currently.  ASSESSMENT 1. Nausea and vomiting in pregnancy prior to [redacted] weeks gestation   2. Dizziness    PLAN Discharge home in stable condition. Encouraged small frequent snacks and drinks. Advance diet slowly. Dizziness precautions.     Follow-up Information   Follow up with [facility].      Follow up with [facility] [place] [facility]. (As needed in emergency)    Contact information:   [address] [id number] [place] [address] [phone]       Medication List         nystatin cream  Commonly known as:  MYCOSTATIN  Apply to affected area 2 times daily     ondansetron 4 MG tablet  Commonly known as:  ZOFRAN  Take 1 tablet (4 mg total) by mouth every 8 (eight) hours as needed for nausea.     prenatal multivitamin Tabs tablet  Take 1 tablet by mouth daily at 12 noon.       [place], [place] 03/05/2013  11:37 PM

## 2013-03-05 NOTE — MAU Note (Signed)
Patient is in with by ems with c/o n/v and dizziness for a few days. She denies vaginal bleeding or abdominal pain. Patient states that she does not have any anti-emetic

## 2013-03-07 LAB — URINE CULTURE

## 2013-03-10 ENCOUNTER — Inpatient Hospital Stay (HOSPITAL_COMMUNITY)
Admission: AD | Admit: 2013-03-10 | Discharge: 2013-03-10 | Disposition: A | Discharge status: Home / Self Care | Payer: Medicaid Other | Source: Ambulatory Visit | Attending: Obstetrics & Gynecology | Admitting: Obstetrics & Gynecology

## 2013-03-10 ENCOUNTER — Encounter (HOSPITAL_COMMUNITY): Payer: Self-pay | Admitting: *Deleted

## 2013-03-10 DIAGNOSIS — O99891 Other specified diseases and conditions complicating pregnancy: Secondary | ICD-10-CM | POA: Insufficient documentation

## 2013-03-10 DIAGNOSIS — K5900 Constipation, unspecified: Secondary | ICD-10-CM

## 2013-03-10 MED ORDER — FLEET ENEMA 7-19 GM/118ML RE ENEM
1.0000 | ENEMA | Freq: Once | RECTAL | Status: AC
  Administered 2013-03-10: 1 via RECTAL

## 2013-03-10 MED ORDER — PROMETHAZINE HCL 25 MG PO TABS: 25.0000 mg | ORAL_TABLET | Freq: Four times a day (QID) | ORAL | Status: DC | PRN

## 2013-03-10 NOTE — MAU Note (Signed)
PT SAYS  IT HURTS TO HAVE BM-  LAST ONE WAS YESTERDAY AT 3 PM.-   HARD, LARGE   .

## 2013-03-10 NOTE — MAU Provider Note (Signed)
  History     CSN: [id number]  Arrival date and time: 03/10/13 2004   None     No chief complaint on file.  HPI Comments: Joann King 22 y.o. G2P1001 presents to MAU with painful constipation. She has been on Zofran for nausea in pregnancy. She was able to go yesterday and now just has gas.        Past Medical History  Diagnosis Date  . Herpes genitalia     doesn't think ever had outbreak  . Anemia     during first pregnancy    Past Surgical History  Procedure Laterality Date  . Cesarean section      History reviewed. No pertinent family history.  History  Substance Use Topics  . Smoking status: Never Smoker   . Smokeless tobacco: Not on file  . Alcohol Use: No    Allergies: No Known Allergies  Prescriptions prior to admission  Medication Sig Dispense Refill  . nystatin cream (MYCOSTATIN) Apply to affected area 2 times daily  15 g  0  . ondansetron (ZOFRAN) 4 MG tablet Take 1 tablet (4 mg total) by mouth every 8 (eight) hours as needed for nausea.  30 tablet  3  . Prenatal Vit-Fe Fumarate-FA (PRENATAL MULTIVITAMIN) TABS tablet Take 1 tablet by mouth daily at 12 noon.        Review of Systems  Constitutional: Negative.   HENT: Negative.   Eyes: Negative.   Respiratory: Negative.   Cardiovascular: Negative.   Gastrointestinal: Positive for constipation.  Genitourinary: Negative.   Musculoskeletal: Negative.   Skin: Negative.   Neurological: Negative.   Psychiatric/Behavioral: Negative.    Physical Exam   Blood pressure 113/61, pulse 81, temperature 98.3 F (36.8 C), temperature source Oral, resp. rate 20, height 5\' 6"  (1.676 m), weight 186 lb (84.369 kg), last menstrual period 01/09/2013.  Physical Exam  Constitutional: She is oriented to person, place, and time. She appears well-developed and well-nourished.  HENT:  Head: Normocephalic and atraumatic.  Eyes: Pupils are equal, round, and reactive to light.  Cardiovascular: Normal rate, regular  rhythm and normal heart sounds.   Respiratory: Effort normal and breath sounds normal.  GI: Soft. Bowel sounds are normal. She exhibits no distension and no mass. There is tenderness. There is no rebound and no guarding.  Genitourinary:  Not examined  Musculoskeletal: Normal range of motion.  Neurological: She is alert and oriented to person, place, and time. She has normal reflexes.  Skin: Skin is warm and dry.  Psychiatric: She has a normal mood and affect.    MAU Course  Procedures  MDM Fleets Enema   Assessment and Plan   A: Constipation in Pregnancy  P: Stop Zofran  Start Phenergan 25 mg po q6 hours prn Increase fluids and fiber Stop sodas  Carolynn Serve 03/10/2013, 8:54 PM

## 2013-03-27 LAB — OB RESULTS CONSOLE VARICELLA ZOSTER ANTIBODY, IGG: Varicella: IMMUNE

## 2013-03-27 LAB — OB RESULTS CONSOLE RUBELLA ANTIBODY, IGM: RUBELLA: IMMUNE

## 2013-04-08 ENCOUNTER — Encounter: Payer: Medicaid Other | Admitting: Obstetrics and Gynecology

## 2013-04-08 ENCOUNTER — Encounter: Payer: Self-pay | Admitting: Obstetrics and Gynecology

## 2013-04-11 ENCOUNTER — Encounter: Payer: Self-pay | Admitting: *Deleted

## 2013-04-24 ENCOUNTER — Encounter: Payer: Self-pay | Admitting: *Deleted

## 2013-04-28 ENCOUNTER — Other Ambulatory Visit (HOSPITAL_COMMUNITY): Payer: Self-pay | Admitting: Physician Assistant

## 2013-04-28 DIAGNOSIS — Z3689 Encounter for other specified antenatal screening: Secondary | ICD-10-CM

## 2013-05-01 ENCOUNTER — Ambulatory Visit (HOSPITAL_COMMUNITY): Payer: Medicaid Other

## 2013-05-19 ENCOUNTER — Ambulatory Visit (HOSPITAL_COMMUNITY)
Admission: RE | Admit: 2013-05-19 | Discharge: 2013-05-19 | Disposition: A | Discharge status: Home / Self Care | Payer: Medicaid Other | Source: Ambulatory Visit | Attending: Physician Assistant | Admitting: Physician Assistant

## 2013-05-19 ENCOUNTER — Other Ambulatory Visit (HOSPITAL_COMMUNITY): Payer: Self-pay | Admitting: Physician Assistant

## 2013-05-19 DIAGNOSIS — Z3689 Encounter for other specified antenatal screening: Secondary | ICD-10-CM

## 2013-05-20 ENCOUNTER — Other Ambulatory Visit (HOSPITAL_COMMUNITY): Payer: Self-pay | Admitting: Physician Assistant

## 2013-05-20 DIAGNOSIS — Z3689 Encounter for other specified antenatal screening: Secondary | ICD-10-CM

## 2013-05-26 ENCOUNTER — Ambulatory Visit (HOSPITAL_COMMUNITY): Payer: Medicaid Other

## 2013-06-05 ENCOUNTER — Ambulatory Visit (HOSPITAL_COMMUNITY): Payer: Medicaid Other

## 2013-06-13 ENCOUNTER — Encounter (HOSPITAL_COMMUNITY): Payer: Self-pay

## 2013-06-13 ENCOUNTER — Ambulatory Visit (HOSPITAL_COMMUNITY)
Admission: RE | Admit: 2013-06-13 | Discharge: 2013-06-13 | Disposition: A | Discharge status: Home / Self Care | Payer: Medicaid Other | Source: Ambulatory Visit | Attending: Physician Assistant | Admitting: Physician Assistant

## 2013-06-13 DIAGNOSIS — Z3689 Encounter for other specified antenatal screening: Secondary | ICD-10-CM | POA: Insufficient documentation

## 2013-06-13 DIAGNOSIS — O34219 Maternal care for unspecified type scar from previous cesarean delivery: Secondary | ICD-10-CM | POA: Insufficient documentation

## 2013-06-13 NOTE — Progress Notes (Signed)
[name] was seen for ultrasound appointment today.  Please see [facility] report for details.  

## 2013-08-03 ENCOUNTER — Inpatient Hospital Stay (HOSPITAL_COMMUNITY)
Admission: AD | Admit: 2013-08-03 | Discharge: 2013-08-03 | Disposition: A | Discharge status: Home / Self Care | Payer: Medicaid Other | Source: Ambulatory Visit | Attending: Family Medicine | Admitting: Family Medicine

## 2013-08-03 ENCOUNTER — Encounter (HOSPITAL_COMMUNITY): Payer: Self-pay | Admitting: *Deleted

## 2013-08-03 DIAGNOSIS — O212 Late vomiting of pregnancy: Secondary | ICD-10-CM | POA: Insufficient documentation

## 2013-08-03 DIAGNOSIS — O219 Vomiting of pregnancy, unspecified: Secondary | ICD-10-CM

## 2013-08-03 DIAGNOSIS — R51 Headache: Secondary | ICD-10-CM | POA: Insufficient documentation

## 2013-08-03 DIAGNOSIS — R42 Dizziness and giddiness: Secondary | ICD-10-CM | POA: Insufficient documentation

## 2013-08-03 LAB — URINE MICROSCOPIC-ADD ON

## 2013-08-03 LAB — URINALYSIS, ROUTINE W REFLEX MICROSCOPIC
Bilirubin Urine: NEGATIVE
GLUCOSE, UA: NEGATIVE mg/dL
HGB URINE DIPSTICK: NEGATIVE
Ketones, ur: NEGATIVE mg/dL
Nitrite: NEGATIVE
Protein, ur: NEGATIVE mg/dL
SPECIFIC GRAVITY, URINE: 1.025 (ref 1.005–1.030)
UROBILINOGEN UA: 1 mg/dL (ref 0.0–1.0)
pH: 7 (ref 5.0–8.0)

## 2013-08-03 MED ORDER — BUTALBITAL-APAP-CAFFEINE 50-325-40 MG PO TABS
2.0000 | ORAL_TABLET | Freq: Once | ORAL | Status: AC
  Administered 2013-08-03: 2 via ORAL
  Filled 2013-08-03: qty 2

## 2013-08-03 MED ORDER — ONDANSETRON HCL 4 MG PO TABS
8.0000 mg | ORAL_TABLET | Freq: Once | ORAL | Status: AC
  Administered 2013-08-03: 8 mg via ORAL
  Filled 2013-08-03: qty 2

## 2013-08-03 MED ORDER — ONDANSETRON 8 MG PO TBDP: 8.0000 mg | ORAL_TABLET | Freq: Two times a day (BID) | ORAL | Status: DC | PRN

## 2013-08-03 NOTE — MAU Provider Note (Signed)
Attestation of Attending Supervision of Advanced Practitioner (PA/CNM/NP): Evaluation and management procedures were performed by the Advanced Practitioner under my supervision and collaboration.  I have reviewed the Advanced Practitioner's note and chart, and I agree with the management and plan.  Reva BoresPRATT,Jadarian Mckay S, MD [facility] Attending 08/03/2013 10:50 PM

## 2013-08-03 NOTE — Discharge Instructions (Signed)
Hyperemesis Gravidarum  Hyperemesis gravidarum is a severe form of nausea and vomiting that happens during pregnancy. Hyperemesis is worse than morning sickness. It may cause you to have nausea or vomiting all day for many days. It may keep you from eating and drinking enough food and liquids. Hyperemesis usually occurs during the first half (the first 20 weeks) of pregnancy. It often goes away once a woman is in her second half of pregnancy. However, sometimes hyperemesis continues through an entire pregnancy.   CAUSES   The cause of this condition is not completely known but is thought to be related to changes in the body's hormones when pregnant. It could be from the high level of the pregnancy hormone or an increase in estrogen in the body.   SIGNS AND SYMPTOMS   · Severe nausea and vomiting.  · Nausea that does not go away.  · Vomiting that does not allow you to keep any food down.  · Weight loss and body fluid loss (dehydration).  · Having no desire to eat or not liking food you have previously enjoyed.  DIAGNOSIS   Your health care provider will do a physical exam and ask you about your symptoms. He or she may also order blood tests and urine tests to make sure something else is not causing the problem.   TREATMENT   You may only need medicine to control the problem. If medicines do not control the nausea and vomiting, you will be treated in the hospital to prevent dehydration, increased acid in the blood (acidosis), weight loss, and changes in the electrolytes in your body that may harm the unborn baby (fetus). You may need IV fluids.   HOME CARE INSTRUCTIONS   · Only take over-the-counter or prescription medicines as directed by your health care provider.  · Try eating a couple of dry crackers or toast in the morning before getting out of bed.  · Avoid foods and smells that upset your stomach.  · Avoid fatty and spicy foods.  · Eat 5 6 small meals a day.  · Do not drink when eating meals. Drink between  meals.  · For snacks, eat high-protein foods, such as cheese.  · Eat or suck on things that have ginger in them. Ginger helps nausea.  · Avoid food preparation. The smell of food can spoil your appetite.  · Avoid iron pills and iron in your multivitamins until after 3 4 months of being pregnant. However, consult with your health care provider before stopping any prescribed iron pills.  SEEK MEDICAL CARE IF:   · Your abdominal pain increases.  · You have a severe headache.  · You have vision problems.  · You are losing weight.  SEEK IMMEDIATE MEDICAL CARE IF:   · You are unable to keep fluids down.  · You vomit blood.  · You have constant nausea and vomiting.  · You have excessive weakness.  · You have extreme thirst.  · You have dizziness or fainting.  · You have a fever or persistent symptoms for more than 2 3 days.  · You have a fever and your symptoms suddenly get worse.  MAKE SURE YOU:   · Understand these instructions.  · Will watch your condition.  · Will get help right away if you are not doing well or get worse.  Document Released: 06/19/2005 Document Revised: 04/09/2013 Document Reviewed: 01/29/2013  ExitCare® Patient Information ©2014 [employer].

## 2013-08-03 NOTE — MAU Note (Signed)
Patient arrived via EMS with complaints of nausea, migraines and feeling lightheaded. Patient states the nausea started around 3 am. Tried drinking ginger ale but unable to keep it down. Took tylenol ES 1tab at 1 pm for migraine.

## 2013-08-03 NOTE — MAU Provider Note (Signed)
History     CSN: [id number]  Arrival date and time: 08/03/13 2028   First Provider Initiated Contact with Patient 08/03/13 2149      No chief complaint on file.  HPI  23 y.o.G2P1001 present at 29.3 with nausea, vomiting and headache. Onset this morning at 3am. Patient had N/V in her first trimester, treated with zofran at home but was out of medication. Has not had any nausea or vomiting since her first trimester until this morning. Vomited several times.  Came via EMS and treated with zofran en route. Currently resolved after zofran. Denies sick contacts, fever, other GI symptoms, or abd pain.  Denies VB, LOF. Reports good fetal movement.  Headache improved slightly after tylenol earlier today and has now resolved.  No history of migrains.  Currently tolerating water PO.   PNCare at HD.  Past Medical History  Diagnosis Date  . Herpes genitalia     doesn't think ever had outbreak  . Anemia     during first pregnancy    Past Surgical History  Procedure Laterality Date  . Cesarean section      History reviewed. No pertinent family history.  History  Substance Use Topics  . Smoking status: Never Smoker   . Smokeless tobacco: Not on file  . Alcohol Use: No    Allergies: No Known Allergies  Prescriptions prior to admission  Medication Sig Dispense Refill  . acetaminophen (TYLENOL) 500 MG tablet Take 500 mg by mouth every 6 (six) hours as needed for headache.       . Prenatal Vit-Fe Fumarate-FA (PRENATAL MULTIVITAMIN) TABS tablet Take 1 tablet by mouth daily at 12 noon.        Review of Systems  Constitutional: Negative for fever and chills.  HENT: Negative for congestion and sore throat.   Eyes: Negative for blurred vision, double vision and photophobia.  Respiratory: Negative for cough and shortness of breath.   Cardiovascular: Negative for chest pain.  Gastrointestinal: Positive for nausea and vomiting. Negative for abdominal pain, diarrhea and constipation.   Genitourinary: Negative for dysuria.  Musculoskeletal: Negative for myalgias.  Neurological: Positive for headaches. Negative for dizziness.   Physical Exam   Blood pressure 134/70, pulse 95, temperature 98.3 F (36.8 C), temperature source Oral, resp. rate 18, height 5\' 6"  (1.676 m), weight 92.987 kg (205 lb), last menstrual period 01/09/2013, SpO2 99.00%.  Physical Exam  Constitutional: She is oriented to person, place, and time. She appears well-developed and well-nourished.  HENT:  Head: Normocephalic and atraumatic.  Eyes: Conjunctivae are normal.  Cardiovascular: Intact distal pulses.   Respiratory: Effort normal. No respiratory distress.  GI: Soft. There is no tenderness.  Appropriately gravid uterus. Nontender.   Genitourinary: Vagina normal and uterus normal.  Dilation: Closed Effacement (%): Thick Exam by:: Fenney MD/L. Munford RN   Musculoskeletal: She exhibits no edema and no tenderness.  Neurological: She is alert and oriented to person, place, and time.  No focal deficits  Skin: Skin is warm and dry.  Psychiatric: She has a normal mood and affect. Her behavior is normal. Thought content normal.    FHT: Cat 1. Baseline 135. Moderate variability. Accels present. Decels absent. No maternal contractions.  MAU Course  Procedures - Cervical exam - UA  Assessment and Plan   # Nausea and Vomiting - Acute. Resolved with zofran - Most likely secondary to pregnancy. Infectious etiology unlikely as afebrile, no diarrhea and vomiting resolved.  - Tolerating PO fluids now - Rx zofran -  encouraged hydration  # Normal Pregnancy - f/u regular Prenatal care at HD - Cat 1 FHT   Quincy Simmonds 08/03/2013, 9:59 PM   I have seen and examined this patient and agree with above documentation in the resident's note.  23 yo G2P1001 who presented via EMS for HA and nausea and vomiting.  All symptoms resolved prior to arrive with treatment on the ambulance.  Tolerating po  now, HA resolved.  RX of zofran for home.  Discussed hydration precautions.  FWB - cat I tracing.  F/u at HD.    Rulon Abide, M.D. [facility] Fellow 08/03/2013 10:35 PM

## 2013-08-17 ENCOUNTER — Inpatient Hospital Stay (HOSPITAL_COMMUNITY)
Admission: AD | Admit: 2013-08-17 | Discharge: 2013-08-17 | Disposition: A | Discharge status: Home / Self Care | Payer: Medicaid Other | Source: Ambulatory Visit | Attending: Obstetrics and Gynecology | Admitting: Obstetrics and Gynecology

## 2013-08-17 ENCOUNTER — Encounter (HOSPITAL_COMMUNITY): Payer: Self-pay

## 2013-08-17 DIAGNOSIS — O26859 Spotting complicating pregnancy, unspecified trimester: Secondary | ICD-10-CM | POA: Insufficient documentation

## 2013-08-17 NOTE — MAU Provider Note (Signed)
  History     CSN: [id number]  Arrival date and time: 08/17/13 2110   None     Chief Complaint  Patient presents with  . Vaginal Bleeding   HPI Comments: G2P1001 at 31.3 wks in with c/o vaginal spotting after sexual intercourse. Denies any pain or ROM. GFM per pt.  Vaginal Bleeding    OB History   Grav Para Term Preterm Abortions TAB SAB Ect Mult Living   2 1 1       1       Past Medical History  Diagnosis Date  . Herpes genitalia     doesn't think ever had outbreak  . Anemia     during first pregnancy    Past Surgical History  Procedure Laterality Date  . Cesarean section      History reviewed. No pertinent family history.  History  Substance Use Topics  . Smoking status: Never Smoker   . Smokeless tobacco: Not on file  . Alcohol Use: No    Allergies: No Known Allergies  Prescriptions prior to admission  Medication Sig Dispense Refill  . acetaminophen (TYLENOL) 500 MG tablet Take 500 mg by mouth every 6 (six) hours as needed for headache.       . Prenatal Vit-Fe Fumarate-FA (PRENATAL MULTIVITAMIN) TABS tablet Take 1 tablet by mouth daily at 12 noon.        Review of Systems  Constitutional: Negative.   HENT: Negative.   Eyes: Negative.   Respiratory: Negative.   Cardiovascular: Negative.   Gastrointestinal: Negative.   Genitourinary: Negative.   Musculoskeletal: Negative.   Skin: Negative.   Neurological: Negative.   Endo/Heme/Allergies: Negative.   Psychiatric/Behavioral: Negative.    Physical Exam   Blood pressure 136/72, pulse 93, temperature 98.1 F (36.7 C), temperature source Oral, resp. rate 18, height 5\' 6"  (1.676 m), weight 211 lb 6.4 oz (95.89 kg), last menstrual period 01/09/2013.  Physical Exam  Constitutional: She appears well-developed and well-nourished.  HENT:  Head: Normocephalic.  Neck: Normal range of motion.  Cardiovascular: Normal rate, regular rhythm, normal heart sounds and intact distal pulses.   Respiratory:  Effort normal and breath sounds normal.  GI: Soft. Bowel sounds are normal.  Genitourinary: Vagina normal and uterus normal.  Musculoskeletal: Normal range of motion.  Neurological: She is alert. She has normal reflexes.  Skin: Skin is warm and dry.  Psychiatric: She has a normal mood and affect. Her behavior is normal. Judgment and thought content normal.    MAU Course  Procedures  MDM Spotting after intercourse.  Assessment and Plan  SVE cervix /th/cl/long/ post/ no blleding noted on glove. Stable maternal-fetal unit. D/c  Home.  Jkayla Spiewak DARLENE 08/17/2013, 10:00 PM

## 2013-08-17 NOTE — MAU Note (Signed)
Pt G2 P1 at 31.3wks, with small amt of bright red blood on tissue after using the bathroom.  Denies any problems with pregnancy.

## 2013-08-18 ENCOUNTER — Encounter (HOSPITAL_COMMUNITY): Payer: Self-pay | Admitting: Emergency Medicine

## 2013-08-18 ENCOUNTER — Emergency Department (HOSPITAL_COMMUNITY)
Admission: EM | Admit: 2013-08-18 | Discharge: 2013-08-18 | Disposition: A | Discharge status: Home / Self Care | Payer: Medicaid Other | Attending: Emergency Medicine | Admitting: Emergency Medicine

## 2013-08-18 DIAGNOSIS — R5383 Other fatigue: Secondary | ICD-10-CM

## 2013-08-18 DIAGNOSIS — T5894XA Toxic effect of carbon monoxide from unspecified source, undetermined, initial encounter: Secondary | ICD-10-CM | POA: Insufficient documentation

## 2013-08-18 DIAGNOSIS — Z8619 Personal history of other infectious and parasitic diseases: Secondary | ICD-10-CM | POA: Insufficient documentation

## 2013-08-18 DIAGNOSIS — Z79899 Other long term (current) drug therapy: Secondary | ICD-10-CM | POA: Insufficient documentation

## 2013-08-18 DIAGNOSIS — D649 Anemia, unspecified: Secondary | ICD-10-CM | POA: Insufficient documentation

## 2013-08-18 DIAGNOSIS — T588X1A Toxic effect of carbon monoxide from other source, accidental (unintentional), initial encounter: Secondary | ICD-10-CM | POA: Insufficient documentation

## 2013-08-18 DIAGNOSIS — Y9389 Activity, other specified: Secondary | ICD-10-CM | POA: Insufficient documentation

## 2013-08-18 DIAGNOSIS — R5381 Other malaise: Secondary | ICD-10-CM | POA: Insufficient documentation

## 2013-08-18 DIAGNOSIS — Z7729 Contact with and (suspected ) exposure to other hazardous substances: Secondary | ICD-10-CM

## 2013-08-18 DIAGNOSIS — Y92009 Unspecified place in unspecified non-institutional (private) residence as the place of occurrence of the external cause: Secondary | ICD-10-CM | POA: Insufficient documentation

## 2013-08-18 LAB — POCT I-STAT, CHEM 8
BUN: 6 mg/dL (ref 6–23)
CALCIUM ION: 1.2 mmol/L (ref 1.12–1.23)
Chloride: 104 mEq/L (ref 96–112)
Creatinine, Ser: 0.6 mg/dL (ref 0.50–1.10)
Glucose, Bld: 87 mg/dL (ref 70–99)
HEMATOCRIT: 30 % — AB (ref 36.0–46.0)
HEMOGLOBIN: 10.2 g/dL — AB (ref 12.0–15.0)
Potassium: 3.5 mEq/L — ABNORMAL LOW (ref 3.7–5.3)
SODIUM: 137 meq/L (ref 137–147)
TCO2: 19 mmol/L (ref 0–100)

## 2013-08-18 LAB — CBC WITH DIFFERENTIAL/PLATELET
BASOS ABS: 0 10*3/uL (ref 0.0–0.1)
BASOS PCT: 0 % (ref 0–1)
EOS PCT: 2 % (ref 0–5)
Eosinophils Absolute: 0.2 10*3/uL (ref 0.0–0.7)
HEMATOCRIT: 28.4 % — AB (ref 36.0–46.0)
Hemoglobin: 10 g/dL — ABNORMAL LOW (ref 12.0–15.0)
LYMPHS PCT: 28 % (ref 12–46)
Lymphs Abs: 2.2 10*3/uL (ref 0.7–4.0)
MCH: 31.1 pg (ref 26.0–34.0)
MCHC: 35.2 g/dL (ref 30.0–36.0)
MCV: 88.2 fL (ref 78.0–100.0)
MONO ABS: 0.6 10*3/uL (ref 0.1–1.0)
Monocytes Relative: 7 % (ref 3–12)
Neutro Abs: 5.1 10*3/uL (ref 1.7–7.7)
Neutrophils Relative %: 63 % (ref 43–77)
PLATELETS: 318 10*3/uL (ref 150–400)
RBC: 3.22 MIL/uL — ABNORMAL LOW (ref 3.87–5.11)
RDW: 13.4 % (ref 11.5–15.5)
WBC: 8.1 10*3/uL (ref 4.0–10.5)

## 2013-08-18 LAB — POCT I-STAT TROPONIN I: TROPONIN I, POC: 0 ng/mL (ref 0.00–0.08)

## 2013-08-18 LAB — CARBOXYHEMOGLOBIN
CARBOXYHEMOGLOBIN: 1.9 % — AB (ref 0.5–1.5)
Methemoglobin: 1.3 % (ref 0.0–1.5)
O2 Saturation: 99.2 %
Total hemoglobin: 13.3 g/dL (ref 12.0–16.0)

## 2013-08-18 NOTE — ED Notes (Signed)
Patient ambulates without any difficulty.  Unassisted, without any respiratory distress.

## 2013-08-18 NOTE — ED Notes (Signed)
[facility] RN paged and now at bedside. Pt on tocomonitor. Baby's HR 150. No contractions. Pt in no pain. Pt is on nonrebreather satting 99%.

## 2013-08-18 NOTE — ED Provider Notes (Signed)
Patient [facility] in the Emergency Department [facility] Patient reports CO exposure, she is [redacted] weeks pregnant She denies cp/sob/weakness/dizziness/HA/syncope She reports +fetal movement Exam - awake/alert, no distress, maex4 Plan: high flow oxygen, fetal monitoring, CO level and poison center consultation    Joya Gaskinsonald W Arli Bree, MD 08/18/13 [phone]

## 2013-08-18 NOTE — Progress Notes (Signed)
[facility] Reviewed pt's course. FH tracing Cat 1. Pt cleared obstetrically at this time.

## 2013-08-18 NOTE — ED Provider Notes (Signed)
Medical screening examination/treatment/procedure(s) were conducted as a shared visit with non-physician practitioner(s) and myself.  I personally evaluated the patient during the encounter.  EKG Interpretation   None         Joya Gaskinsonald W Keithan Dileonardo, MD 08/18/13 2016

## 2013-08-18 NOTE — ED Notes (Signed)
MD ok with Rapid [facility] RN to leave, baby is stable- HR 145, no contractions. Pt is stable in room with nonrebreather on.

## 2013-08-18 NOTE — MAU Provider Note (Signed)
Attestation of Attending Supervision of Advanced Practitioner (CNM/NP): Evaluation and management procedures were performed by the Advanced Practitioner under my supervision and collaboration.  I have reviewed the Advanced Practitioner's note and chart, and I agree with the management and plan.  Krisna Omar 08/18/2013 7:53 AM   

## 2013-08-18 NOTE — ED Notes (Signed)
Reordered carboxyhemoglobin level per request from respiratory. Level did not need to be redrawn. Order did not appear in the computer for them.
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
## 2013-08-18 NOTE — ED Notes (Signed)
Meal tray ordered for pt  

## 2013-08-18 NOTE — ED Notes (Signed)
Received call from poison control. They are now signing off as pt is stable.

## 2013-08-18 NOTE — ED Notes (Signed)
Gave pt apple juice and crackers/peanut butter

## 2013-08-18 NOTE — Discharge Instructions (Signed)
Carbon Monoxide Poisoning °Carbon monoxide poisoning is illness caused by inhaling carbon monoxide gas. Carbon monoxide is a colorless, odorless gas. When the gas is inhaled, it quickly enters the bloodstream and reduces the amount of oxygen that goes to your cells. This decrease in oxygen received by the body tissues quickly becomes a life-threatening problem. Carbon monoxide poisoning is a medical emergency that requires immediate medical care. People who are elderly or who have heart disease or lung disease are more likely to have ill effects from carbon monoxide poisoning. °CAUSES  °Carbon monoxide poisoning is often caused by inhaling exhaust fumes from fuel burning sources. Burning any carbon-containing fuel (gasoline, coal, charcoal, wood) releases carbon monoxide into the air. Sources of carbon monoxide fumes include: °· Motor exhaust from cars, motorcycles, and boats. °· Cigarette smoke. °· Propane-powered tools and vehicles. °· Gas-powered tools and other industrial equipment. °Without proper ventilation, carbon monoxide can build up in an enclosed or partially enclosed area. For example, if a chimney is not clear, a camping stove is used indoors, or a car is left running in a closed garage, this can lead to carbon monoxide poisoning. °SYMPTOMS  °· Headache. °· Dizziness. °· Sleepiness. °· Weakness. °· Confusion. °· Fainting. °· Seizures. °· Coma. °· Nausea. °· Vomiting. °· Chest pain. °· Shortness of breath. °DIAGNOSIS  °Your caregiver will probably suspect that you have carbon monoxide poisoning based on your symptoms and history of possible exposure. A blood test may be done to confirm the diagnosis. °TREATMENT  °Treatment involves getting to fresh air immediately and removing yourself from the dangerous environment. In the emergency room, you may be given oxygen therapy. Oxygen can be delivered through a face mask or breathing tubes that fit under your nostrils. In some severe cases, hyperbaric oxygen  therapy may be used. For this treatment, the person enters a chamber where oxygen is delivered under forced pressure. This speeds up the process in which oxygen is absorbed and replaces the carbon monoxide in the blood. °PREVENTION  °· Install a carbon monoxide detector in your home. °· Have all gas stoves and furnaces inspected once a year. °· Clean all fireplace chimneys and flues at least once a year. °· Have the exhaust system in your car checked once a year. °· Never keep a car's motor running in a closed garage. °· Never sleep in a car with the motor running. °· Make sure all rooms that are heated with gasoline, coal, charcoal, or wood are properly ventilated. °HOME CARE INSTRUCTIONS  °Do not return to the area where you were exposed to carbon monoxide. The area must be thoroughly ventilated before it is safe to return. °SEEK IMMEDIATE MEDICAL CARE IF: °You suspect that a person has inhaled carbon monoxide gas. Remove the person from the area immediately. Call your local emergency services (911 in [place] Begin rescue breathing if the person is unconscious. °MAKE SURE YOU:  °· Understand these instructions. °· Will watch your condition. °· Will get help right away if you are not doing well or get worse. °Document Released: 06/16/2000 Document Revised: 12/19/2011 Document Reviewed: 09/07/2011 °Exit[employer]® Patient Information ©2014 [employer]. ° °
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
## 2013-09-03 ENCOUNTER — Inpatient Hospital Stay (HOSPITAL_COMMUNITY)
Admission: AD | Admit: 2013-09-03 | Discharge: 2013-09-03 | Disposition: A | Discharge status: Home / Self Care | Payer: Medicaid Other | Source: Ambulatory Visit | Attending: Obstetrics and Gynecology | Admitting: Obstetrics and Gynecology

## 2013-09-03 ENCOUNTER — Encounter (HOSPITAL_COMMUNITY): Payer: Self-pay | Admitting: *Deleted

## 2013-09-03 DIAGNOSIS — O99891 Other specified diseases and conditions complicating pregnancy: Secondary | ICD-10-CM

## 2013-09-03 DIAGNOSIS — K5289 Other specified noninfective gastroenteritis and colitis: Secondary | ICD-10-CM | POA: Insufficient documentation

## 2013-09-03 DIAGNOSIS — O212 Late vomiting of pregnancy: Secondary | ICD-10-CM | POA: Insufficient documentation

## 2013-09-03 DIAGNOSIS — O9989 Other specified diseases and conditions complicating pregnancy, childbirth and the puerperium: Principal | ICD-10-CM

## 2013-09-03 DIAGNOSIS — A088 Other specified intestinal infections: Secondary | ICD-10-CM

## 2013-09-03 DIAGNOSIS — A084 Viral intestinal infection, unspecified: Secondary | ICD-10-CM

## 2013-09-03 LAB — URINALYSIS, ROUTINE W REFLEX MICROSCOPIC
BILIRUBIN URINE: NEGATIVE
GLUCOSE, UA: NEGATIVE mg/dL
HGB URINE DIPSTICK: NEGATIVE
Ketones, ur: 15 mg/dL — AB
Nitrite: NEGATIVE
Protein, ur: 30 mg/dL — AB
SPECIFIC GRAVITY, URINE: 1.025 (ref 1.005–1.030)
UROBILINOGEN UA: 1 mg/dL (ref 0.0–1.0)
pH: 6.5 (ref 5.0–8.0)

## 2013-09-03 LAB — URINE MICROSCOPIC-ADD ON

## 2013-09-03 MED ORDER — LACTATED RINGERS IV BOLUS (SEPSIS)
1000.0000 mL | Freq: Once | INTRAVENOUS | Status: AC
  Administered 2013-09-03: 1000 mL via INTRAVENOUS

## 2013-09-03 MED ORDER — ONDANSETRON HCL 4 MG/2ML IJ SOLN
4.0000 mg | Freq: Once | INTRAMUSCULAR | Status: AC
  Administered 2013-09-03: 4 mg via INTRAVENOUS
  Filled 2013-09-03: qty 2

## 2013-09-03 MED ORDER — ONDANSETRON HCL 4 MG PO TABS: 4.0000 mg | ORAL_TABLET | Freq: Four times a day (QID) | ORAL | Status: DC

## 2013-09-03 MED ORDER — PROMETHAZINE HCL 25 MG PO TABS: 25.0000 mg | ORAL_TABLET | Freq: Four times a day (QID) | ORAL | Status: DC | PRN

## 2013-09-03 NOTE — MAU Note (Signed)
States her nephew was over 2 days ago and was vomiting.  Things got worse for her after she was around him

## 2013-09-03 NOTE — MAU Provider Note (Signed)
Attestation of Attending Supervision of Advanced Practitioner: Evaluation and management procedures were performed by the PA/NP/CNM/OB Fellow under my supervision/collaboration. Chart reviewed and agree with management and plan.  Tasha Diaz V 09/03/2013 9:32 PM

## 2013-09-03 NOTE — MAU Provider Note (Signed)
History     CSN: [id number]  Arrival date and time: 09/03/13 1614   First Provider Initiated Contact with Patient 09/03/13 1751      Chief Complaint  Patient presents with  . Emesis   HPI [name] is a 23 y.o. G2P1001 at [redacted]w[redacted]d who presents to MAU today with complaint of N/V since Monday. The patient states that she was around a relative who was sick recently as well prior to onset of symptoms. She states that she has been able to eat some bread and crackers but little fluid intake. She denies vaginal bleeding, discharge, LOF, abdominal pain, headache, UTI symptoms or contractions. She reports good fetal movement.   OB History   Grav Para Term Preterm Abortions TAB SAB Ect Mult Living   2 1 1       1       Past Medical History  Diagnosis Date  . Herpes genitalia     doesn't think ever had outbreak  . Anemia     during first pregnancy  . Anemia     Past Surgical History  Procedure Laterality Date  . Cesarean section      History reviewed. No pertinent family history.  History  Substance Use Topics  . Smoking status: Never Smoker   . Smokeless tobacco: Never Used  . Alcohol Use: No    Allergies: No Known Allergies  Prescriptions prior to admission  Medication Sig Dispense Refill  . Prenatal Vit-Fe Fumarate-FA (PRENATAL MULTIVITAMIN) TABS tablet Take 1 tablet by mouth daily at 12 noon.        Review of Systems  Constitutional: Negative for fever and malaise/fatigue.  Gastrointestinal: Positive for nausea, vomiting and diarrhea. Negative for abdominal pain and constipation.  Genitourinary: Negative for dysuria, urgency and frequency.       Neg - vaginal bleeding, discharge, LOF  Neurological: Negative for headaches.   Physical Exam   Blood pressure 133/70, pulse 100, temperature 98.1 F (36.7 C), temperature source Oral, resp. rate 18, weight 213 lb (96.616 kg), last menstrual period 01/09/2013.  Physical Exam  Constitutional: She is oriented to  person, place, and time. She appears well-developed and well-nourished. No distress.  HENT:  Head: Normocephalic and atraumatic.  Cardiovascular: Normal rate.   Respiratory: Effort normal.  GI: Soft. Bowel sounds are normal. She exhibits no distension and no mass. There is no tenderness. There is no rebound and no guarding.  Neurological: She is alert and oriented to person, place, and time.  Skin: Skin is warm and dry. No erythema.  Psychiatric: She has a normal mood and affect.   Results for orders placed during the hospital encounter of 09/03/13 (from the past 24 hour(s))  URINALYSIS, ROUTINE W REFLEX MICROSCOPIC     Status: Abnormal   Collection Time    09/03/13  4:35 PM      Result Value Ref Range   Color, Urine AMBER (*) YELLOW   APPearance CLEAR  CLEAR   Specific Gravity, Urine 1.025  1.005 - 1.030   pH 6.5  5.0 - 8.0   Glucose, UA NEGATIVE  NEGATIVE mg/dL   Hgb urine dipstick NEGATIVE  NEGATIVE   Bilirubin Urine NEGATIVE  NEGATIVE   Ketones, ur 15 (*) NEGATIVE mg/dL   Protein, ur 30 (*) NEGATIVE mg/dL   Urobilinogen, UA 1.0  0.0 - 1.0 mg/dL   Nitrite NEGATIVE  NEGATIVE   Leukocytes, UA MODERATE (*) NEGATIVE  URINE MICROSCOPIC-ADD ON     Status: Abnormal  Collection Time    09/03/13  4:35 PM      Result Value Ref Range   Squamous Epithelial / LPF MANY (*) RARE   WBC, UA 7-10  <3 WBC/hpf   RBC / HPF 3-6  <3 RBC/hpf   Bacteria, UA MANY (*) RARE   Crystals CA OXALATE CRYSTALS (*) NEGATIVE   Fetal Monitoring: Baseline: 130 bpm, moderate variability, + accelerations, no decelerations Contractions: none   MAU Course  Procedures None  MDM 1 liter IV LR with Zofran IV given No episodes of emesis while in MAU Patient is able to tolerate PO today  Assessment and Plan  A: Acute viral gastroenteritis  P: Discharge home Rx for Phenergan and Zofran given to patient Patient advised to increased PO hydration and advance diet as tolerated Patient encouraged to  [facility] care Patient may return to MAU as needed or if her condition were to change or worsen  Freddi StarrJulie N Ethier, PA-C  09/03/2013, 7:25 PM

## 2013-09-03 NOTE — MAU Note (Addendum)
Ongoing problem this preg.  Has not been on meds.  Feels nauseated all the time, threw up 4 times yesterday and again today. Had nose bleed this morning after she threw up

## 2013-09-03 NOTE — Discharge Instructions (Signed)
Diet for Diarrhea, Adult °Frequent, runny stools (diarrhea) may be caused or worsened by food or drink. Diarrhea may be relieved by changing your diet. Since diarrhea can last up to 7 days, it is easy for you to lose too much fluid from the body and become dehydrated. Fluids that are lost need to be replaced. Along with a modified diet, make sure you drink enough fluids to keep your urine clear or pale yellow. °DIET INSTRUCTIONS °· Ensure adequate fluid intake (hydration): have 1 cup (8 oz) of fluid for each diarrhea episode. Avoid fluids that contain simple sugars or sports drinks, fruit juices, whole milk products, and sodas. Your urine should be clear or pale yellow if you are drinking enough fluids. Hydrate with an oral rehydration solution that you can purchase at pharmacies, retail stores, and online. You can prepare an oral rehydration solution at home by mixing the following ingredients together: °·   tsp table salt. °· ¾ tsp baking soda. °·  tsp salt substitute containing potassium chloride. °· 1  tablespoons sugar. °· 1 L (34 oz) of water. °· Certain foods and beverages may increase the speed at which food moves through the gastrointestinal (GI) tract. These foods and beverages should be avoided and include: °· Caffeinated and alcoholic beverages. °· High-fiber foods, such as raw fruits and vegetables, nuts, seeds, and whole grain breads and cereals. °· Foods and beverages sweetened with sugar alcohols, such as xylitol, sorbitol, and mannitol. °· Some foods may be well tolerated and may help thicken stool including: °· Starchy foods, such as rice, toast, pasta, low-sugar cereal, oatmeal, grits, baked potatoes, crackers, and bagels.   °· Bananas.   °· Applesauce. °· Add probiotic-rich foods to help increase healthy bacteria in the GI tract, such as yogurt and fermented milk products. °RECOMMENDED FOODS AND BEVERAGES °Starches °Choose foods with less than 2 g of fiber per serving. °· Recommended:  Raimer,  French, and pita breads, plain rolls, buns, bagels. Plain muffins, matzo. Soda, saltine, or graham crackers. Pretzels, melba toast, zwieback. Cooked cereals made with water: cornmeal, farina, cream cereals. Dry cereals: refined corn, wheat, rice. Potatoes prepared any way without skins, refined macaroni, spaghetti, noodles, refined rice. °· Avoid:  Bread, rolls, or crackers made with whole wheat, multi-grains, rye, bran seeds, nuts, or coconut. Corn tortillas or taco shells. Cereals containing whole grains, multi-grains, bran, coconut, nuts, raisins. Cooked or dry oatmeal. Coarse wheat cereals, granola. Cereals advertised as "high-fiber." Potato skins. Whole grain pasta, wild or brown rice. Popcorn. Sweet potatoes, yams. Sweet rolls, doughnuts, waffles, pancakes, sweet breads. °Vegetables °· Recommended: Strained tomato and vegetable juices. Most well-cooked and canned vegetables without seeds. Fresh: Tender lettuce, cucumber without the skin, cabbage, spinach, bean sprouts. °· Avoid: Fresh, cooked, or canned: Artichokes, baked beans, beet greens, broccoli, Brussels sprouts, corn, kale, legumes, peas, sweet potatoes. Cooked: Green or red cabbage, spinach. Avoid large servings of any vegetables because vegetables shrink when cooked, and they contain more fiber per serving than fresh vegetables. °Fruit °· Recommended: Cooked or canned: Apricots, applesauce, cantaloupe, cherries, fruit cocktail, grapefruit, grapes, kiwi, mandarin oranges, peaches, pears, plums, watermelon. Fresh: Apples without skin, ripe banana, grapes, cantaloupe, cherries, grapefruit, peaches, oranges, plums. Keep servings limited to ½ cup or 1 piece. °· Avoid: Fresh: Apples with skin, apricots, mangoes, pears, raspberries, strawberries. Prune juice, stewed or dried prunes. Dried fruits, raisins, dates. Large servings of all fresh fruits. °Protein °· Recommended: Ground or well-cooked tender beef, ham, veal, lamb, pork, or poultry. Eggs. Fish,  oysters, shrimp,   lobster, other seafoods. Liver, organ meats.  Avoid: Tough, fibrous meats with gristle. Peanut butter, smooth or chunky. Cheese, nuts, seeds, legumes, dried peas, beans, lentils. Dairy  Recommended: Yogurt, lactose-free milk, kefir, drinkable yogurt, buttermilk, soy milk, or plain hard cheese.  Avoid: Milk, chocolate milk, beverages made with milk, such as milkshakes. Soups  Recommended: Bouillon, broth, or soups made from allowed foods. Any strained soup.  Avoid: Soups made from vegetables that are not allowed, cream or milk-based soups. Desserts and Sweets  Recommended: Sugar-free gelatin, sugar-free frozen ice pops made without sugar alcohol.  Avoid: Plain cakes and cookies, pie made with fruit, pudding, custard, cream pie. Gelatin, fruit, ice, sherbet, frozen ice pops. Ice cream, ice milk without nuts. Plain hard candy, honey, jelly, molasses, syrup, sugar, chocolate syrup, gumdrops, marshmallows. Fats and Oils  Recommended: Limit fats to less than 8 tsp per day.  Avoid: Seeds, nuts, olives, avocados. Margarine, butter, cream, mayonnaise, salad oils, plain salad dressings. Plain gravy, crisp bacon without rind. Beverages  Recommended: Water, decaffeinated teas, oral rehydration solutions, sugar-free beverages not sweetened with sugar alcohols.  Avoid: Fruit juices, caffeinated beverages (coffee, tea, soda), alcohol, sports drinks, or lemon-lime soda. Condiments  Recommended: Ketchup, mustard, horseradish, vinegar, cocoa powder. Spices in moderation: allspice, basil, bay leaves, celery powder or leaves, cinnamon, cumin powder, curry powder, ginger, mace, marjoram, onion or garlic powder, oregano, paprika, parsley flakes, ground pepper, rosemary, sage, savory, tarragon, thyme, turmeric.  Avoid: Coconut, honey. Document Released: 09/09/2003 Document Revised: 03/13/2012 Document Reviewed: 11/03/2011 [facility] Patient Information 2014 [place], [place]. Hyperemesis  Gravidarum Diet Hyperemesis gravidarum is a severe form of morning sickness. It is characterized by frequent and severe vomiting. It happens during the first trimester of pregnancy. It may be caused by the rapid hormone changes that happen during pregnancy. It is associated with a 5% weight loss of pre-pregnancy weight. The hyperemesis diet may be used to lessen symptoms of nausea and vomiting. EATING GUIDELINES  Eat 5 to 6 small meals daily instead of 3 large meals.  Avoid foods with strong smells.  Avoid drinking 30 minutes before and after meals.  Avoid fried or high-fat foods, such as butter and cream sauces.  Starchy foods are usually well-tolerated, such as cereal, toast, bread, potatoes, pasta, rice, and pretzels.  Eat crackers before you get out of bed in the morning.  Avoid spicy foods.  Ginger may help with nausea. Add  tsp ginger to hot tea or choose ginger tea.  Continue to take your prenatal vitamins as directed by your caregiver. SAMPLE MEAL PLAN Breakfast    cup oatmeal  1 slice toast  1 tsp heart-healthy margarine  1 tsp jelly  1 scrambled egg Midmorning Snack   1 cup low-fat yogurt Lunch   Plain ham sandwich  Carrot or celery sticks  1 small apple  3 graham crackers Midafternoon Snack   Cheese and crackers Dinner  4 oz pork tenderloin  1 small baked potato  1 tsp margarine   cup broccoli   cup grapes Evening Snack  1 cup pudding Document Released: 04/16/2007 Document Revised: 09/11/2011 Document Reviewed: 11/19/2012 ExitCare Patient Information 2014 [place], [place].

## 2013-09-13 ENCOUNTER — Inpatient Hospital Stay (HOSPITAL_COMMUNITY)
Admission: AD | Admit: 2013-09-13 | Discharge: 2013-09-13 | Disposition: A | Discharge status: Home / Self Care | Payer: Medicaid Other | Source: Ambulatory Visit | Attending: Obstetrics & Gynecology | Admitting: Obstetrics & Gynecology

## 2013-09-13 ENCOUNTER — Encounter (HOSPITAL_COMMUNITY): Payer: Self-pay

## 2013-09-13 DIAGNOSIS — O99019 Anemia complicating pregnancy, unspecified trimester: Secondary | ICD-10-CM

## 2013-09-13 DIAGNOSIS — O99891 Other specified diseases and conditions complicating pregnancy: Secondary | ICD-10-CM | POA: Insufficient documentation

## 2013-09-13 DIAGNOSIS — O99013 Anemia complicating pregnancy, third trimester: Secondary | ICD-10-CM

## 2013-09-13 DIAGNOSIS — D649 Anemia, unspecified: Secondary | ICD-10-CM

## 2013-09-13 DIAGNOSIS — R42 Dizziness and giddiness: Secondary | ICD-10-CM | POA: Insufficient documentation

## 2013-09-13 DIAGNOSIS — O9989 Other specified diseases and conditions complicating pregnancy, childbirth and the puerperium: Principal | ICD-10-CM

## 2013-09-13 LAB — URINALYSIS, ROUTINE W REFLEX MICROSCOPIC
BILIRUBIN URINE: NEGATIVE
Glucose, UA: NEGATIVE mg/dL
Hgb urine dipstick: NEGATIVE
KETONES UR: NEGATIVE mg/dL
LEUKOCYTES UA: NEGATIVE
NITRITE: NEGATIVE
PH: 7 (ref 5.0–8.0)
Protein, ur: NEGATIVE mg/dL
Specific Gravity, Urine: 1.02 (ref 1.005–1.030)
UROBILINOGEN UA: 0.2 mg/dL (ref 0.0–1.0)

## 2013-09-13 LAB — CBC
HCT: 28.9 % — ABNORMAL LOW (ref 36.0–46.0)
Hemoglobin: 9.9 g/dL — ABNORMAL LOW (ref 12.0–15.0)
MCH: 30.2 pg (ref 26.0–34.0)
MCHC: 34.3 g/dL (ref 30.0–36.0)
MCV: 88.1 fL (ref 78.0–100.0)
PLATELETS: 296 10*3/uL (ref 150–400)
RBC: 3.28 MIL/uL — AB (ref 3.87–5.11)
RDW: 13.3 % (ref 11.5–15.5)
WBC: 9.6 10*3/uL (ref 4.0–10.5)

## 2013-09-13 LAB — GLUCOSE, CAPILLARY: Glucose-Capillary: 103 mg/dL — ABNORMAL HIGH (ref 70–99)

## 2013-09-13 MED ORDER — FERROUS SULFATE 325 (65 FE) MG PO TABS: 325.0000 mg | ORAL_TABLET | Freq: Two times a day (BID) | ORAL | Status: DC

## 2013-09-13 NOTE — Discharge Instructions (Signed)
Pregnancy and Anemia Anemia is a condition in which the concentration of red blood cells or hemoglobin in the blood is below normal. Hemoglobin is a substance in red blood cells that carries oxygen to the tissues of the body. Anemia results in not enough oxygen reaching these tissues.  Anemia during pregnancy is common because the fetus uses more iron and folic acid as it is developing. Your body may not produce enough red blood cells because of this. Also, during pregnancy, the liquid part of the blood (plasma) increases by about 50%, and the red blood cells increase by only 25%. This lowers the concentration of the red blood cells and creates a natural anemia-like situation.  CAUSES  The most common cause of anemia during pregnancy is not having enough iron in the body to make red blood cells (iron deficiency anemia). Other causes may include:  Folic acid deficiency.  Vitamin B12 deficiency.  Certain prescription or over-the-counter medicines.  Certain medical conditions or infections that destroy red blood cells.  A low platelet count and bleeding caused by antibodies that go through the placenta to the fetus from the mother's blood. SIGNS AND SYMPTOMS  Mild anemia may not be noticeable. If it becomes severe, symptoms may include:  Tiredness.  Shortness of breath, especially with exercise.  Weakness.  Fainting.  Pale looking skin.  Headaches.  Feeling a fast or irregular heartbeat (palpitations). DIAGNOSIS  The type of anemia is usually diagnosed from your family and medical history and blood tests. TREATMENT  Treatment of anemia during pregnancy depends on the cause of the anemia. Treatment can include:  Supplements of iron, vitamin B12, or folic acid.  A blood transfusion. This may be needed if blood loss is severe.  Hospitalization. This may be needed if there is significant continual blood loss.  Dietary changes. HOME CARE INSTRUCTIONS   Follow your dietitian's or  health care provider's dietary recommendations.  Increase your vitamin C intake. This will help the stomach absorb more iron.  Eat a diet rich in iron. This would include foods such as:  Liver.  Beef.  Whole grain bread.  Eggs.  Dried fruit.  Take iron and vitamins as directed by your health care provider.  Eat green leafy vegetables. These are a good source of folic acid. SEEK MEDICAL CARE IF:   You have frequent or lasting headaches.  You are looking pale.  You are bruising easily. SEEK IMMEDIATE MEDICAL CARE IF:   You have extreme weakness, shortness of breath, or chest pain.  You become dizzy or have trouble concentrating.  You have heavy vaginal bleeding.  You develop a rash.  You have bloody or black, tarry stools.  You faint.  You vomit up blood.  You vomit repeatedly.  You have abdominal pain.  You have a fever or persistent symptoms for more than 2 3 days.  You have a fever and your symptoms suddenly get worse.  You are dehydrated. MAKE SURE YOU:   Understand these instructions.  Will watch your condition.  Will get help right away if you are not doing well or get worse. Document Released: 06/16/2000 Document Revised: 04/09/2013 Document Reviewed: 01/29/2013 [facility] Patient Information 2014 [place]Exit[place], [place]LL[place].

## 2013-09-13 NOTE — MAU Note (Signed)
Pt presents complaining of dizziness that started around lunch and did not resolve with lying down. States this has happened before and it was because she was dehydrated.
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
## 2013-09-13 NOTE — Progress Notes (Signed)
Notified of pt arrival in MAU. Received orders to check CBG and offer po fluids.

## 2013-09-13 NOTE — Progress Notes (Signed)
Notified that pt states she is feeling better and cbc results are available. Will come see pt

## 2013-09-18 NOTE — MAU Provider Note (Signed)
Encourage not to eat as much processed meats. Attestation of Attending Supervision of Advanced Practitioner (CNM/NP): Evaluation and management procedures were performed by the Advanced Practitioner under my supervision and collaboration. I have reviewed the Advanced Practitioner's note and chart, and I agree with the management and plan.  LEGGETT,KELLY H. 3:30 PM

## 2013-10-03 ENCOUNTER — Encounter (HOSPITAL_COMMUNITY)
Admission: RE | Admit: 2013-10-03 | Discharge: 2013-10-03 | Disposition: A | Discharge status: Home / Self Care | Payer: Medicaid Other | Source: Ambulatory Visit | Attending: Obstetrics & Gynecology | Admitting: Obstetrics & Gynecology

## 2013-10-03 ENCOUNTER — Encounter (HOSPITAL_COMMUNITY): Payer: Self-pay

## 2013-10-03 DIAGNOSIS — Z01812 Encounter for preprocedural laboratory examination: Secondary | ICD-10-CM | POA: Insufficient documentation

## 2013-10-03 HISTORY — DX: Heartburn: R12

## 2013-10-03 HISTORY — DX: Other specified pregnancy related conditions, unspecified trimester: O26.899

## 2013-10-03 LAB — TYPE AND SCREEN
ABO/RH(D): O POS
ANTIBODY SCREEN: NEGATIVE

## 2013-10-03 LAB — CBC
HEMATOCRIT: 30.3 % — AB (ref 36.0–46.0)
Hemoglobin: 10.2 g/dL — ABNORMAL LOW (ref 12.0–15.0)
MCH: 29.7 pg (ref 26.0–34.0)
MCHC: 33.7 g/dL (ref 30.0–36.0)
MCV: 88.3 fL (ref 78.0–100.0)
PLATELETS: 326 10*3/uL (ref 150–400)
RBC: 3.43 MIL/uL — ABNORMAL LOW (ref 3.87–5.11)
RDW: 13.8 % (ref 11.5–15.5)
WBC: 8.8 10*3/uL (ref 4.0–10.5)

## 2013-10-03 LAB — RPR: RPR Ser Ql: NONREACTIVE

## 2013-10-03 NOTE — Patient Instructions (Addendum)
   Your procedure is scheduled on: Tuesday, April 7  [facility] at: 12 Noon Pick up the phone at the desk and dial [phone] and inform us of your arrival.  Please call this number if you have any problems the morning of surgery: [phone]  Remember: Do not eat food after midnight: Monday Do not drink clear liquids after: 930 AM Tuesday, day of surgery Take these medicines the morning of surgery with a SIP OF WATER:  None  Do not wear jewelry, make-up, or FINGER nail polish No metal in your hair or on your body. Do not wear lotions, powders, perfumes.  You may wear deodorant.  Do not bring valuables to the hospital. Contacts, dentures or bridgework may not be worn into surgery.  Leave suitcase in the car. After Surgery it may be brought to your room. For patients being admitted to the hospital, checkout time is 11:00am the day of discharge.  Home with [facility] cell [phone].

## 2013-10-07 ENCOUNTER — Observation Stay (HOSPITAL_COMMUNITY)
Admission: RE | Admit: 2013-10-07 | Discharge: 2013-10-09 | Disposition: A | Discharge status: Home / Self Care | Payer: Medicaid Other | Source: Ambulatory Visit | Attending: Family Medicine | Admitting: Family Medicine

## 2013-10-07 ENCOUNTER — Encounter (HOSPITAL_COMMUNITY): Payer: Medicaid Other | Admitting: Anesthesiology

## 2013-10-07 ENCOUNTER — Encounter (HOSPITAL_COMMUNITY): Payer: Self-pay | Admitting: *Deleted

## 2013-10-07 ENCOUNTER — Inpatient Hospital Stay (HOSPITAL_COMMUNITY): Payer: Medicaid Other | Admitting: Anesthesiology

## 2013-10-07 ENCOUNTER — Encounter (HOSPITAL_COMMUNITY)
Admission: RE | Disposition: A | Discharge status: Home / Self Care | Payer: Self-pay | Source: Ambulatory Visit | Attending: Family Medicine

## 2013-10-07 DIAGNOSIS — O99313 Alcohol use complicating pregnancy, third trimester: Secondary | ICD-10-CM

## 2013-10-07 DIAGNOSIS — Z3480 Encounter for supervision of other normal pregnancy, unspecified trimester: Secondary | ICD-10-CM

## 2013-10-07 DIAGNOSIS — Z98891 History of uterine scar from previous surgery: Secondary | ICD-10-CM

## 2013-10-07 DIAGNOSIS — O34219 Maternal care for unspecified type scar from previous cesarean delivery: Secondary | ICD-10-CM

## 2013-10-07 DIAGNOSIS — F1010 Alcohol abuse, uncomplicated: Secondary | ICD-10-CM | POA: Diagnosis not present

## 2013-10-07 DIAGNOSIS — O99344 Other mental disorders complicating childbirth: Secondary | ICD-10-CM | POA: Insufficient documentation

## 2013-10-07 HISTORY — DX: History of uterine scar from previous surgery: Z98.891

## 2013-10-07 LAB — PREPARE RBC (CROSSMATCH)

## 2013-10-07 SURGERY — Surgical Case
Anesthesia: Spinal

## 2013-10-07 MED ORDER — PHENYLEPHRINE 8 MG IN D5W 100 ML (0.08MG/ML) PREMIX OPTIME
INJECTION | INTRAVENOUS | Status: DC | PRN
  Administered 2013-10-07: 60 ug/min via INTRAVENOUS

## 2013-10-07 MED ORDER — LACTATED RINGERS IV SOLN
INTRAVENOUS | Status: DC
  Administered 2013-10-08: 02:00:00 via INTRAVENOUS

## 2013-10-07 MED ORDER — BUPIVACAINE HCL (PF) 0.25 % IJ SOLN
INTRAMUSCULAR | Status: AC
  Filled 2013-10-07: qty 30

## 2013-10-07 MED ORDER — PROMETHAZINE HCL 25 MG/ML IJ SOLN
INTRAMUSCULAR | Status: AC
  Filled 2013-10-07: qty 1

## 2013-10-07 MED ORDER — SIMETHICONE 80 MG PO CHEW
80.0000 mg | CHEWABLE_TABLET | ORAL | Status: DC
  Administered 2013-10-08 (×2): 80 mg via ORAL
  Filled 2013-10-07 (×2): qty 1

## 2013-10-07 MED ORDER — IBUPROFEN 600 MG PO TABS
600.0000 mg | ORAL_TABLET | Freq: Four times a day (QID) | ORAL | Status: DC
  Administered 2013-10-08 – 2013-10-09 (×7): 600 mg via ORAL
  Filled 2013-10-07 (×7): qty 1

## 2013-10-07 MED ORDER — ZOLPIDEM TARTRATE 5 MG PO TABS: 5.0000 mg | ORAL_TABLET | Freq: Every evening | ORAL | Status: DC | PRN

## 2013-10-07 MED ORDER — TETANUS-DIPHTH-ACELL PERTUSSIS 5-2.5-18.5 LF-MCG/0.5 IM SUSP: 0.5000 mL | Freq: Once | INTRAMUSCULAR | Status: DC

## 2013-10-07 MED ORDER — DIBUCAINE 1 % RE OINT: 1.0000 "application " | TOPICAL_OINTMENT | RECTAL | Status: DC | PRN

## 2013-10-07 MED ORDER — PROMETHAZINE HCL 25 MG/ML IJ SOLN
6.2500 mg | INTRAMUSCULAR | Status: DC | PRN
  Administered 2013-10-07: 12.5 mg via INTRAVENOUS

## 2013-10-07 MED ORDER — MORPHINE SULFATE (PF) 0.5 MG/ML IJ SOLN
INTRAMUSCULAR | Status: DC | PRN
  Administered 2013-10-07: .2 mg via INTRATHECAL

## 2013-10-07 MED ORDER — OXYTOCIN 10 UNIT/ML IJ SOLN
INTRAMUSCULAR | Status: AC
  Filled 2013-10-07: qty 4

## 2013-10-07 MED ORDER — PRENATAL MULTIVITAMIN CH
1.0000 | ORAL_TABLET | Freq: Every day | ORAL | Status: DC
  Administered 2013-10-08 – 2013-10-09 (×2): 1 via ORAL
  Filled 2013-10-07 (×2): qty 1

## 2013-10-07 MED ORDER — FENTANYL CITRATE 0.05 MG/ML IJ SOLN
INTRAMUSCULAR | Status: AC
  Administered 2013-10-07: 50 ug
  Filled 2013-10-07: qty 2

## 2013-10-07 MED ORDER — SENNOSIDES-DOCUSATE SODIUM 8.6-50 MG PO TABS
2.0000 | ORAL_TABLET | ORAL | Status: DC
  Administered 2013-10-08 (×2): 2 via ORAL
  Filled 2013-10-07 (×2): qty 2

## 2013-10-07 MED ORDER — SCOPOLAMINE 1 MG/3DAYS TD PT72
1.0000 | MEDICATED_PATCH | Freq: Once | TRANSDERMAL | Status: DC
  Administered 2013-10-07: 1.5 mg via TRANSDERMAL

## 2013-10-07 MED ORDER — KETOROLAC TROMETHAMINE 30 MG/ML IJ SOLN
INTRAMUSCULAR | Status: AC
  Administered 2013-10-07: 30 mg via INTRAMUSCULAR
  Filled 2013-10-07: qty 1

## 2013-10-07 MED ORDER — KETOROLAC TROMETHAMINE 30 MG/ML IJ SOLN
30.0000 mg | Freq: Four times a day (QID) | INTRAMUSCULAR | Status: AC | PRN
  Administered 2013-10-07: 30 mg via INTRAMUSCULAR

## 2013-10-07 MED ORDER — ONDANSETRON HCL 4 MG PO TABS: 4.0000 mg | ORAL_TABLET | ORAL | Status: DC | PRN

## 2013-10-07 MED ORDER — WITCH HAZEL-GLYCERIN EX PADS: 1.0000 "application " | MEDICATED_PAD | CUTANEOUS | Status: DC | PRN

## 2013-10-07 MED ORDER — MEPERIDINE HCL 25 MG/ML IJ SOLN: 6.2500 mg | INTRAMUSCULAR | Status: DC | PRN

## 2013-10-07 MED ORDER — PHENYLEPHRINE 8 MG IN D5W 100 ML (0.08MG/ML) PREMIX OPTIME
INJECTION | INTRAVENOUS | Status: AC
  Filled 2013-10-07: qty 100

## 2013-10-07 MED ORDER — BUPIVACAINE HCL 0.25 % IJ SOLN
INTRAMUSCULAR | Status: DC | PRN
  Administered 2013-10-07: 30 mL

## 2013-10-07 MED ORDER — CEFAZOLIN SODIUM-DEXTROSE 2-3 GM-% IV SOLR
2.0000 g | INTRAVENOUS | Status: AC
  Administered 2013-10-07: 2 g via INTRAVENOUS

## 2013-10-07 MED ORDER — SIMETHICONE 80 MG PO CHEW
80.0000 mg | CHEWABLE_TABLET | Freq: Three times a day (TID) | ORAL | Status: DC
  Administered 2013-10-08 – 2013-10-09 (×4): 80 mg via ORAL
  Filled 2013-10-07 (×5): qty 1

## 2013-10-07 MED ORDER — NALBUPHINE HCL 10 MG/ML IJ SOLN
INTRAMUSCULAR | Status: AC
  Administered 2013-10-07: 10 mg
  Filled 2013-10-07: qty 1

## 2013-10-07 MED ORDER — FENTANYL CITRATE 0.05 MG/ML IJ SOLN
INTRAMUSCULAR | Status: AC
  Filled 2013-10-07: qty 2

## 2013-10-07 MED ORDER — LANOLIN HYDROUS EX OINT: 1.0000 "application " | TOPICAL_OINTMENT | CUTANEOUS | Status: DC | PRN

## 2013-10-07 MED ORDER — OXYTOCIN 10 UNIT/ML IJ SOLN
40.0000 [IU] | INTRAVENOUS | Status: DC | PRN
  Administered 2013-10-07: 40 [IU] via INTRAVENOUS

## 2013-10-07 MED ORDER — LACTATED RINGERS IV SOLN
Freq: Once | INTRAVENOUS | Status: AC
  Administered 2013-10-07: 13:00:00 via INTRAVENOUS

## 2013-10-07 MED ORDER — OXYTOCIN 40 UNITS IN LACTATED RINGERS INFUSION - SIMPLE MED: 62.5000 mL/h | INTRAVENOUS | Status: AC

## 2013-10-07 MED ORDER — CEFAZOLIN SODIUM-DEXTROSE 2-3 GM-% IV SOLR
INTRAVENOUS | Status: AC
  Filled 2013-10-07: qty 50

## 2013-10-07 MED ORDER — FENTANYL CITRATE 0.05 MG/ML IJ SOLN
INTRAMUSCULAR | Status: DC | PRN
  Administered 2013-10-07: 12.5 ug via INTRATHECAL

## 2013-10-07 MED ORDER — FENTANYL CITRATE 0.05 MG/ML IJ SOLN: 25.0000 ug | INTRAMUSCULAR | Status: DC | PRN

## 2013-10-07 MED ORDER — SIMETHICONE 80 MG PO CHEW: 80.0000 mg | CHEWABLE_TABLET | ORAL | Status: DC | PRN

## 2013-10-07 MED ORDER — ONDANSETRON HCL 4 MG/2ML IJ SOLN
INTRAMUSCULAR | Status: AC
  Filled 2013-10-07: qty 2

## 2013-10-07 MED ORDER — MENTHOL 3 MG MT LOZG: 1.0000 | LOZENGE | OROMUCOSAL | Status: DC | PRN

## 2013-10-07 MED ORDER — SCOPOLAMINE 1 MG/3DAYS TD PT72
MEDICATED_PATCH | TRANSDERMAL | Status: AC
  Administered 2013-10-07: 1.5 mg via TRANSDERMAL
  Filled 2013-10-07: qty 1

## 2013-10-07 MED ORDER — ONDANSETRON HCL 4 MG/2ML IJ SOLN
INTRAMUSCULAR | Status: DC | PRN
  Administered 2013-10-07: 4 mg via INTRAVENOUS

## 2013-10-07 MED ORDER — MORPHINE SULFATE 0.5 MG/ML IJ SOLN
INTRAMUSCULAR | Status: AC
  Filled 2013-10-07: qty 10

## 2013-10-07 MED ORDER — KETOROLAC TROMETHAMINE 30 MG/ML IJ SOLN: 30.0000 mg | Freq: Four times a day (QID) | INTRAMUSCULAR | Status: AC | PRN

## 2013-10-07 MED ORDER — IBUPROFEN 600 MG PO TABS: 600.0000 mg | ORAL_TABLET | Freq: Four times a day (QID) | ORAL | Status: DC

## 2013-10-07 MED ORDER — LACTATED RINGERS IV SOLN
INTRAVENOUS | Status: DC
  Administered 2013-10-07: 14:00:00 via INTRAVENOUS

## 2013-10-07 MED ORDER — DIPHENHYDRAMINE HCL 25 MG PO CAPS
25.0000 mg | ORAL_CAPSULE | Freq: Four times a day (QID) | ORAL | Status: DC | PRN
  Administered 2013-10-08 (×2): 25 mg via ORAL
  Filled 2013-10-07 (×2): qty 1

## 2013-10-07 MED ORDER — ONDANSETRON HCL 4 MG/2ML IJ SOLN: 4.0000 mg | INTRAMUSCULAR | Status: DC | PRN

## 2013-10-07 MED ORDER — OXYCODONE-ACETAMINOPHEN 5-325 MG PO TABS: 1.0000 | ORAL_TABLET | ORAL | Status: DC | PRN

## 2013-10-07 MED ORDER — BUPIVACAINE IN DEXTROSE 0.75-8.25 % IT SOLN
INTRATHECAL | Status: DC | PRN
  Administered 2013-10-07: 1.8 mL via INTRATHECAL

## 2013-10-07 SURGICAL SUPPLY — 35 items
APL SKNCLS STERI-STRIP NONHPOA (GAUZE/BANDAGES/DRESSINGS) ×1
BENZOIN TINCTURE PRP APPL 2/3 (GAUZE/BANDAGES/DRESSINGS) ×1 IMPLANT
CLAMP CORD UMBIL (MISCELLANEOUS) IMPLANT
CLOTH BEACON ORANGE TIMEOUT ST (SAFETY) ×2 IMPLANT
CONTAINER PREFILL 10% NBF 15ML (MISCELLANEOUS) IMPLANT
DRAIN JACKSON PRT FLT 7MM (DRAIN) IMPLANT
DRAPE LG THREE QUARTER DISP (DRAPES) IMPLANT
DRSG OPSITE POSTOP 4X10 (GAUZE/BANDAGES/DRESSINGS) ×2 IMPLANT
DRSG TELFA 3X8 NADH (GAUZE/BANDAGES/DRESSINGS) ×2 IMPLANT
DURAPREP 26ML APPLICATOR (WOUND CARE) ×2 IMPLANT
ELECT REM PT RETURN 9FT ADLT (ELECTROSURGICAL) ×2
ELECTRODE REM PT RTRN 9FT ADLT (ELECTROSURGICAL) ×1 IMPLANT
EVACUATOR SILICONE 100CC (DRAIN) IMPLANT
EXTRACTOR VACUUM M CUP 4 TUBE (SUCTIONS) IMPLANT
GLOVE BIO SURGEON STRL SZ7 (GLOVE) ×2 IMPLANT
GLOVE BIOGEL PI IND STRL 7.0 (GLOVE) ×1 IMPLANT
GLOVE BIOGEL PI INDICATOR 7.0 (GLOVE) ×1
GOWN STRL REUS W/TWL LRG LVL3 (GOWN DISPOSABLE) ×4 IMPLANT
KIT ABG SYR 3ML LUER SLIP (SYRINGE) IMPLANT
NDL HYPO 25X5/8 SAFETYGLIDE (NEEDLE) ×1 IMPLANT
NEEDLE HYPO 25X5/8 SAFETYGLIDE (NEEDLE) ×2 IMPLANT
NS IRRIG 1000ML POUR BTL (IV SOLUTION) ×2 IMPLANT
PACK C SECTION WH (CUSTOM PROCEDURE TRAY) ×2 IMPLANT
PAD ABD 7.5X8 STRL (GAUZE/BANDAGES/DRESSINGS) ×1 IMPLANT
PAD DRESSING TELFA 3X8 NADH (GAUZE/BANDAGES/DRESSINGS) IMPLANT
PAD OB MATERNITY 4.3X12.25 (PERSONAL CARE ITEMS) ×2 IMPLANT
RTRCTR C-SECT PINK 25CM LRG (MISCELLANEOUS) ×2 IMPLANT
STAPLER VISISTAT 35W (STAPLE) IMPLANT
STRIP CLOSURE SKIN 1/2X4 (GAUZE/BANDAGES/DRESSINGS) ×1 IMPLANT
SUT VIC AB 0 CTX 36 (SUTURE) ×10
SUT VIC AB 0 CTX36XBRD ANBCTRL (SUTURE) ×5 IMPLANT
SUT VIC AB 4-0 KS 27 (SUTURE) IMPLANT
TOWEL OR 17X24 6PK STRL BLUE (TOWEL DISPOSABLE) ×2 IMPLANT
TRAY FOLEY CATH 14FR (SET/KITS/TRAYS/PACK) ×2 IMPLANT
WATER STERILE IRR 1000ML POUR (IV SOLUTION) ×2 IMPLANT

## 2013-10-07 NOTE — Anesthesia Preprocedure Evaluation (Signed)
Anesthesia Evaluation  Patient identified by MRN, date of birth, ID band Patient awake    Reviewed: Allergy & Precautions, H&P , NPO status , Patient's Chart, lab work & pertinent test results  History of Anesthesia Complications Negative for: history of anesthetic complications  Airway Mallampati: II TM Distance: >3 FB Neck ROM: Full    Dental  (+) Teeth Intact,    Pulmonary neg pulmonary ROS,  breath sounds clear to auscultation        Cardiovascular negative cardio ROS  Rhythm:Regular     Neuro/Psych negative neurological ROS     GI/Hepatic negative GI ROS, Neg liver ROS,   Endo/Other  negative endocrine ROS  Renal/GU negative Renal ROS     Musculoskeletal   Abdominal   Peds  Hematology  (+) anemia ,   Anesthesia Other Findings   Reproductive/Obstetrics                           Anesthesia Physical Anesthesia Plan  ASA: II  Anesthesia Plan: Spinal   Post-op Pain Management:    Induction:   Airway Management Planned: Natural Airway  Additional Equipment: None  Intra-op Plan:   Post-operative Plan:   Informed Consent: I have reviewed the patients History and Physical, chart, labs and discussed the procedure including the risks, benefits and alternatives for the proposed anesthesia with the patient or authorized representative who has indicated his/her understanding and acceptance.   Dental advisory given  Plan Discussed with: CRNA and Surgeon  Anesthesia Plan Comments:         Anesthesia Quick Evaluation
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
## 2013-10-07 NOTE — Anesthesia Postprocedure Evaluation (Signed)
  Anesthesia Post-op Note  Patient: Joann King  Procedure(s) Performed: Procedure(s): CESAREAN SECTION (N/A)  Patient Location: PACU  Anesthesia Type:Spinal  Level of Consciousness: awake, alert  and oriented  Airway and Oxygen Therapy: Patient Spontanous Breathing  Post-op Pain: mild  Post-op Assessment: Post-op Vital signs reviewed, Patient's Cardiovascular Status Stable, Respiratory Function Stable, Patent Airway, No signs of Nausea or vomiting and Pain level controlled  Post-op Vital Signs: Reviewed and stable  Last Vitals:  Filed Vitals:   10/07/13 1845  BP: 122/70  Pulse: 80  Temp:   Resp: 15    Complications: No apparent anesthesia complications

## 2013-10-07 NOTE — Op Note (Signed)
Piedad Climesiesha Burnell BlanksM Fero PROCEDURE DATE: 10/07/2013  PREOPERATIVE DIAGNOSIS: Intrauterine pregnancy at  864w1d weeks gestation; elective repeat  POSTOPERATIVE DIAGNOSIS: The same  PROCEDURE: Repeat Low Transverse Cesarean Section  SURGEON:  Dr. Candelaria CelesteJacob Jiya Kissinger  INDICATIONS: Joann GammonDiesha M Blye is a 23 y.o. G2P1001 at 164w1d scheduled for cesarean section secondary to Elective Repeat.  The risks of cesarean section discussed with the patient included but were not limited to: bleeding which may require transfusion or reoperation; infection which may require antibiotics; injury to bowel, bladder, ureters or other surrounding organs; injury to the fetus; need for additional procedures including hysterectomy in the event of a life-threatening hemorrhage; placental abnormalities wth subsequent pregnancies, incisional problems, thromboembolic phenomenon and other postoperative/anesthesia complications. The patient concurred with the proposed plan, giving informed written consent for the procedure.    FINDINGS:  Viable female infant in cephalic presentation.  Apgars 8 and 9, weight pending.  Clear amniotic fluid.  Intact placenta, three vessel cord.  Normal uterus, fallopian tubes and ovaries bilaterally.  Dense adhesions of the uterus to the anterior abdominal wall.  ANESTHESIA:    Spinal INTRAVENOUS FLUIDS: 2000 ml ESTIMATED BLOOD LOSS: 800 ml URINE OUTPUT:  200 ml SPECIMENS: Placenta sent to L&D COMPLICATIONS: None immediate  PROCEDURE IN DETAIL:  The patient received intravenous antibiotics and had sequential compression devices applied to her lower extremities while in the preoperative area.  She was then taken to the operating room where spinal anesthesia was administered and was found to be adequate. She was then placed in a dorsal supine position with a leftward tilt, and prepped and draped in a sterile manner.  A foley catheter was placed into her bladder and attached to constant gravity, which drained clear fluid  throughout.  After an adequate timeout was performed, a Pfannenstiel skin incision was made with scalpel and carried through to the underlying layer of fascia. The fascia was incised in the midline and this incision was extended bilaterally using the Mayo scissors. Kocher clamps were applied to the superior aspect of the fascial incision and the underlying rectus muscles were dissected off bluntly. A similar process was carried out on the inferior aspect of the facial incision. The rectus muscles were separated in the midline bluntly and the peritoneum was entered bluntly, where a dense adhesion of the uterus to the abdominal wall was discovered.  The adhesion was taken down and an Teacher, early years/preAlexis Retractor was inserted.  Attention was turned to the lower uterine segment where a transverse hysterotomy was made with a scalpel and extended bilaterally bluntly. The infant was successfully delivered, and cord was clamped and cut and infant was handed over to awaiting neonatology team. Uterine massage was then administered and the placenta delivered intact with three-vessel cord. The uterus was then cleared of clot and debris.  The hysterotomy was closed with 0 Vicryl in a running locked fashion, and an imbricating layer was also placed with a 0 Vicryl. Overall, excellent hemostasis was noted. The retractor was removed.  The abdomen and the pelvis were cleared of all clot and debris. Hemostasis was confirmed on all surfaces.  The rectus muscles were reapproximated using 0 vicryl interrupted stitches. The fascia was then closed using 0 Vicryl in a running fashion.  The skin was closed with 4-0 Vicryl. The patient tolerated the procedure well. Sponge, lap, instrument and needle counts were correct x 2. She was taken to the recovery room in stable condition.    Candelaria CelesteSTINSON, Ebelyn Bohnet JEHIEL DO 10/07/2013 5:26 PM

## 2013-10-07 NOTE — Anesthesia Procedure Notes (Signed)
Spinal  Patient location during procedure: OB Start time: 10/07/2013 4:06 PM End time: 10/07/2013 4:13 PM Staffing Anesthesiologist: Ajax Schroll, CHRIS Performed by: other anesthesia staff  Preanesthetic Checklist Completed: patient identified, surgical consent, pre-op evaluation, timeout performed, IV checked, risks and benefits discussed and monitors and equipment checked Spinal Block Patient position: sitting Prep: site prepped and draped and DuraPrep Patient monitoring: heart rate, cardiac monitor, continuous pulse ox and blood pressure Approach: midline Location: L3-4 Injection technique: single-shot Needle Needle type: Pencan  Needle gauge: 24 G Needle length: 10 cm Assessment Sensory level: T4 Events: paresthesia Additional Notes Alyssa Crawford SRNA performed spinal under supervision

## 2013-10-07 NOTE — Transfer of Care (Signed)
Immediate Anesthesia Transfer of Care Note  Patient: Joann King  Procedure(s) Performed: Procedure(s): CESAREAN SECTION (N/A)  Patient Location: PACU  Anesthesia Type:Spinal  Level of Consciousness: awake  Airway & Oxygen Therapy: Patient Spontanous Breathing  Post-op Assessment: Report given to PACU RN  Post vital signs: Reviewed and stable  Complications: No apparent anesthesia complications

## 2013-10-08 ENCOUNTER — Encounter (HOSPITAL_COMMUNITY): Payer: Self-pay | Admitting: Obstetrics & Gynecology

## 2013-10-08 DIAGNOSIS — O34219 Maternal care for unspecified type scar from previous cesarean delivery: Secondary | ICD-10-CM | POA: Diagnosis not present

## 2013-10-08 DIAGNOSIS — Z98891 History of uterine scar from previous surgery: Secondary | ICD-10-CM

## 2013-10-08 LAB — CBC
HCT: 26.9 % — ABNORMAL LOW (ref 36.0–46.0)
Hemoglobin: 9.1 g/dL — ABNORMAL LOW (ref 12.0–15.0)
MCH: 29.8 pg (ref 26.0–34.0)
MCHC: 33.8 g/dL (ref 30.0–36.0)
MCV: 88.2 fL (ref 78.0–100.0)
PLATELETS: 272 10*3/uL (ref 150–400)
RBC: 3.05 MIL/uL — AB (ref 3.87–5.11)
RDW: 13.7 % (ref 11.5–15.5)
WBC: 7.9 10*3/uL (ref 4.0–10.5)

## 2013-10-08 LAB — RPR

## 2013-10-08 LAB — BIRTH TISSUE RECOVERY COLLECTION (PLACENTA DONATION)

## 2013-10-08 NOTE — Progress Notes (Signed)
Ur chart review completed.  

## 2013-10-08 NOTE — Progress Notes (Signed)
Subjective: Postpartum Day 1: Cesarean Delivery Patient denies  nausea, vomiting and incisional pain. Tolerating PO  Objective: Vital signs in last 24 hours: Temp:  [97.4 F (36.3 C)-98.4 F (36.9 C)] 97.5 F (36.4 C) (04/08 0400) Pulse Rate:  [74-100] 77 (04/08 0400) Resp:  [15-20] 18 (04/08 0400) BP: (92-138)/(46-86) 105/47 mmHg (04/08 0400) SpO2:  [97 %-100 %] 98 % (04/08 0400)  Physical Exam:  General: alert, cooperative and no distress Lochia: appropriate Uterine Fundus: firm Incision: pressure dressing dry DVT Evaluation: SCDs on   Recent Labs  10/08/13 0615  HGB 9.1*  HCT 26.9*    Assessment/Plan: Status post Cesarean section. Doing well postoperatively.  Discontinue foley and IVF.  Continue pain control.  Joann HeritageJacob J King 10/08/2013, 9:14 AM

## 2013-10-08 NOTE — Anesthesia Postprocedure Evaluation (Signed)
  Anesthesia Post-op Note  Patient: Joann GammonDiesha M King  Procedure(s) Performed: Procedure(s): CESAREAN SECTION (N/A)  Patient Location: Mother/Baby  Anesthesia Type:Spinal  Level of Consciousness: awake  Airway and Oxygen Therapy: Patient Spontanous Breathing  Post-op Pain: mild  Post-op Assessment: Patient's Cardiovascular Status Stable and Respiratory Function Stable  Post-op Vital Signs: stable  Last Vitals:  Filed Vitals:   10/08/13 0800  BP: 100/50  Pulse: 85  Temp: 36.6 C  Resp: 18    Complications: No apparent anesthesia complications

## 2013-10-08 NOTE — Addendum Note (Signed)
Addendum created 10/08/13 1610 by Renford DillsJanet L Seydina Holliman, CRNA   Modules edited: Notes Section   Notes Section:  File: [id number]
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
## 2013-10-08 NOTE — Lactation Note (Signed)
This note was copied from the chart of Joann Almira CoasterDiesha Power. Lactation Consultation Note Breastfeeding baby, noted baby making noises. Demonstrated chin tug. Baby latched well after several attempts. Mom sleepy, no support person at bedside. Baby sucking on top lip. WH/LC brochure given w/resources, support groups and LC services.Referred to Baby and Me Book in Breastfeeding section Pg. 22-23 for position options and Proper latch demonstration.Encouraged to call for assistance if needed and to verify proper latch.Encouraged comfort during BF so colostrum flows better and mom will enjoy the feeding longer. Taking deep breaths and breast massage during BF.  Patient Name: Joann King GNFAO'ZToday's Date: 10/08/2013 Reason for consult: Initial assessment   Maternal Data Infant to breast within first hour of birth: Yes Has patient been taught Hand Expression?: Yes Does the patient have breastfeeding experience prior to this delivery?: No  Feeding Feeding Type: Breast Fed Nipple Type: Regular Length of feed: 10 min  LATCH Score/Interventions Latch: Repeated attempts needed to sustain latch, nipple held in mouth throughout feeding, stimulation needed to elicit sucking reflex. Intervention(s): Adjust position;Assist with latch;Breast massage;Breast compression  Audible Swallowing: None Intervention(s): Skin to skin Intervention(s): Skin to skin;Hand expression;Alternate breast massage  Type of Nipple: Everted at rest and after stimulation  Comfort (Breast/Nipple): Soft / non-tender     Hold (Positioning): No assistance needed to correctly position infant at breast.  LATCH Score: 7  Lactation Tools Discussed/Used     Consult Status Consult Status: Follow-up Date: 10/08/13 Follow-up type: In-patient    Joann King 10/08/2013, 6:05 AM

## 2013-10-09 DIAGNOSIS — O34219 Maternal care for unspecified type scar from previous cesarean delivery: Secondary | ICD-10-CM | POA: Diagnosis not present

## 2013-10-09 MED ORDER — OXYCODONE-ACETAMINOPHEN 5-325 MG PO TABS: 1.0000 | ORAL_TABLET | ORAL | Status: DC | PRN

## 2013-10-09 MED ORDER — IBUPROFEN 600 MG PO TABS: 600.0000 mg | ORAL_TABLET | Freq: Four times a day (QID) | ORAL | Status: DC

## 2013-10-09 MED ORDER — SENNOSIDES-DOCUSATE SODIUM 8.6-50 MG PO TABS: 2.0000 | ORAL_TABLET | Freq: Two times a day (BID) | ORAL | Status: DC | PRN

## 2013-10-09 NOTE — Discharge Summary (Addendum)
Obstetric Discharge Summary Reason for Admission: cesarean section Prenatal Procedures: ultrasound Intrapartum Procedures: cesarean: low cervical, transverse Postpartum Procedures: none Complications-Operative and Postpartum: none Hemoglobin  Date Value Ref Range Status  10/08/2013 9.1* 12.0 - 15.0 g/dL Final     HCT  Date Value Ref Range Status  10/08/2013 26.9* 36.0 - 46.0 % Final    Physical Exam:  General: alert, cooperative and no distress Lochia: appropriate Uterine Fundus: firm Incision: dressing clean, dry, intact. DVT Evaluation: No evidence of DVT seen on physical exam.  Discharge Diagnoses: Term Pregnancy-delivered; Repeat LTCS  Brief Hospital Course: Patient admitted for RLTCS.  Patient had uncomplicated RLTCS with delivery of viable female infant.   Patient doing well at time of discharge.  She is breastfeeding and planning depo for contraception.  Discharge Information: Date: 10/09/2013 Activity: pelvic rest Diet: routine Medications: PNV, Ibuprofen, Colace and Percocet Condition: stable Instructions: refer to practice specific booklet Discharge to: home  Newborn Data: Live born female  Birth Weight: 6 lb 6.7 oz (2910 g) APGAR: 8, 9  Home with mother.  Tommie SamsJayce G Cook 10/09/2013, 7:55 AM  I have seen this patient and agree with the above resident's note.  LEFTWICH-KIRBY, LISA Certified Nurse-Midwife  Attestation of Attending Supervision of Advanced Practitioner (PA/CNM/NP): Evaluation and management procedures were performed by the Advanced Practitioner under my supervision and collaboration.  I have reviewed the Advanced Practitioner's note and chart, and I agree with the management and plan.  Candelaria CelesteJacob Stinson, DO Attending Physician Faculty Practice, [facility]

## 2013-10-09 NOTE — Discharge Instructions (Signed)
Cesarean Delivery °Care After °Refer to this sheet in the next few weeks. These instructions provide you with information on caring for yourself after your procedure. Your health care provider may also give you specific instructions. Your treatment has been planned according to current medical practices, but problems sometimes occur. Call your health care provider if you have any problems or questions after you go home. °HOME CARE INSTRUCTIONS  °· Only take over-the-counter or prescription medications as directed by your health care provider. °· Do not drink alcohol, especially if you are breastfeeding or taking medication to relieve pain. °· Do not chew or smoke tobacco. °· Continue to use good perineal care. Good perineal care includes: °· Wiping your perineum from front to back. °· Keeping your perineum clean. °· Check your surgical cut (incision) daily for increased redness, drainage, swelling, or separation of skin. °· Clean your incision gently with soap and water every day, and then pat it dry. If your health care provider says it is OK, leave the incision uncovered. Use a bandage (dressing) if the incision is draining fluid or appears irritated. If the adhesive strips across the incision do not fall off within 7 days, carefully peel them off. °· Hug a pillow when coughing or sneezing until your incision is healed. This helps to relieve pain. °· Do not use tampons or douche until your health care provider says it is okay. °· Shower, wash your hair, and take tub baths as directed by your health care provider. °· Wear a well-fitting bra that provides breast support. °· Limit wearing support panties or control-top hose. °· Drink enough fluids to keep your urine clear or pale yellow. °· Eat high-fiber foods such as whole grain cereals and breads, brown rice, beans, and fresh fruits and vegetables every day. These foods may help prevent or relieve constipation. °· Resume activities such as climbing stairs,  driving, lifting, exercising, or traveling as directed by your health care provider. °· Talk to your health care provider about resuming sexual activities. This is dependent upon your risk of infection, your rate of healing, and your comfort and desire to resume sexual activity. °· Try to have someone help you with your household activities and your newborn for at least a few days after you leave the hospital. °· Rest as much as possible. Try to rest or take a nap when your newborn is sleeping. °· Increase your activities gradually. °· Keep all of your scheduled postpartum appointments. It is very important to keep your scheduled follow-up appointments. At these appointments, your health care provider will be checking to make sure that you are healing physically and emotionally. °SEEK MEDICAL CARE IF:  °· You are passing large clots from your vagina. Save any clots to show your health care provider. °· You have a foul smelling discharge from your vagina. °· You have trouble urinating. °· You are urinating frequently. °· You have pain when you urinate. °· You have a change in your bowel movements. °· You have increasing redness, pain, or swelling near your incision. °· You have pus draining from your incision. °· Your incision is separating. °· You have painful, hard, or reddened breasts. °· You have a severe headache. °· You have blurred vision or see spots. °· You feel sad or depressed. °· You have thoughts of hurting yourself or your newborn. °· You have questions about your care, the care of your newborn, or medications. °· You are dizzy or lightheaded. °· You have a rash. °· You   have pain, redness, or swelling at the site of the removed intravenous access (IV) tube. °· You have nausea or vomiting. °· You stopped breastfeeding and have not had a menstrual period within 12 weeks of stopping. °· You are not breastfeeding and have not had a menstrual period within 12 weeks of delivery. °· You have a fever. °SEEK  IMMEDIATE MEDICAL CARE IF: °· You have persistent pain. °· You have chest pain. °· You have shortness of breath. °· You faint. °· You have leg pain. °· You have stomach pain. °· Your vaginal bleeding saturates 2 or more sanitary pads in 1 hour. °MAKE SURE YOU:  °· Understand these instructions. °· Will watch your condition. °· Will get help right away if you are not doing well or get worse. °Document Released: 03/11/2002 Document Revised: 02/19/2013 Document Reviewed: 02/14/2012 °Exit[employer]® Patient Information ©2014 [employer]. ° ° ° °

## 2013-10-11 LAB — TYPE AND SCREEN
ABO/RH(D): O POS
ANTIBODY SCREEN: NEGATIVE
Unit division: 0
Unit division: 0

## 2014-03-04 ENCOUNTER — Encounter (HOSPITAL_COMMUNITY): Payer: Self-pay | Admitting: Emergency Medicine

## 2014-03-04 ENCOUNTER — Emergency Department (HOSPITAL_COMMUNITY)
Admission: EM | Admit: 2014-03-04 | Discharge: 2014-03-04 | Disposition: A | Discharge status: Home / Self Care | Payer: No Typology Code available for payment source | Attending: Emergency Medicine | Admitting: Emergency Medicine

## 2014-03-04 DIAGNOSIS — Z791 Long term (current) use of non-steroidal anti-inflammatories (NSAID): Secondary | ICD-10-CM | POA: Insufficient documentation

## 2014-03-04 DIAGNOSIS — S335XXA Sprain of ligaments of lumbar spine, initial encounter: Secondary | ICD-10-CM | POA: Insufficient documentation

## 2014-03-04 DIAGNOSIS — Y92410 Unspecified street and highway as the place of occurrence of the external cause: Secondary | ICD-10-CM | POA: Insufficient documentation

## 2014-03-04 DIAGNOSIS — IMO0002 Reserved for concepts with insufficient information to code with codable children: Secondary | ICD-10-CM | POA: Diagnosis not present

## 2014-03-04 DIAGNOSIS — Z8719 Personal history of other diseases of the digestive system: Secondary | ICD-10-CM | POA: Diagnosis not present

## 2014-03-04 DIAGNOSIS — Z862 Personal history of diseases of the blood and blood-forming organs and certain disorders involving the immune mechanism: Secondary | ICD-10-CM | POA: Insufficient documentation

## 2014-03-04 DIAGNOSIS — S0990XA Unspecified injury of head, initial encounter: Secondary | ICD-10-CM | POA: Diagnosis not present

## 2014-03-04 DIAGNOSIS — Y9389 Activity, other specified: Secondary | ICD-10-CM | POA: Insufficient documentation

## 2014-03-04 DIAGNOSIS — Z8619 Personal history of other infectious and parasitic diseases: Secondary | ICD-10-CM | POA: Diagnosis not present

## 2014-03-04 DIAGNOSIS — Z79899 Other long term (current) drug therapy: Secondary | ICD-10-CM | POA: Diagnosis not present

## 2014-03-04 DIAGNOSIS — S39012A Strain of muscle, fascia and tendon of lower back, initial encounter: Secondary | ICD-10-CM

## 2014-03-04 DIAGNOSIS — S46912A Strain of unspecified muscle, fascia and tendon at shoulder and upper arm level, left arm, initial encounter: Secondary | ICD-10-CM

## 2014-03-04 MED ORDER — CYCLOBENZAPRINE HCL 10 MG PO TABS: 10.0000 mg | ORAL_TABLET | Freq: Two times a day (BID) | ORAL | Status: DC | PRN

## 2014-03-04 MED ORDER — NAPROXEN 500 MG PO TABS: 500.0000 mg | ORAL_TABLET | Freq: Two times a day (BID) | ORAL | Status: DC
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
## 2014-03-04 NOTE — ED Provider Notes (Signed)
Medical screening examination/treatment/procedure(s) were performed by non-physician practitioner and as supervising physician I was immediately available for consultation/collaboration.  Megan Docherty, MD 03/04/14 1637 
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
## 2014-03-10 ENCOUNTER — Emergency Department (HOSPITAL_COMMUNITY)
Admission: EM | Admit: 2014-03-10 | Discharge: 2014-03-11 | Disposition: A | Discharge status: Home / Self Care | Payer: Medicaid Other | Attending: Emergency Medicine | Admitting: Emergency Medicine

## 2014-03-10 ENCOUNTER — Emergency Department (HOSPITAL_COMMUNITY): Payer: Medicaid Other

## 2014-03-10 ENCOUNTER — Encounter (HOSPITAL_COMMUNITY): Payer: Self-pay | Admitting: Emergency Medicine

## 2014-03-10 DIAGNOSIS — O210 Mild hyperemesis gravidarum: Secondary | ICD-10-CM | POA: Diagnosis not present

## 2014-03-10 DIAGNOSIS — A499 Bacterial infection, unspecified: Secondary | ICD-10-CM | POA: Diagnosis not present

## 2014-03-10 DIAGNOSIS — N760 Acute vaginitis: Secondary | ICD-10-CM | POA: Diagnosis not present

## 2014-03-10 DIAGNOSIS — O2390 Unspecified genitourinary tract infection in pregnancy, unspecified trimester: Secondary | ICD-10-CM | POA: Diagnosis not present

## 2014-03-10 DIAGNOSIS — B9689 Other specified bacterial agents as the cause of diseases classified elsewhere: Secondary | ICD-10-CM | POA: Insufficient documentation

## 2014-03-10 DIAGNOSIS — Z862 Personal history of diseases of the blood and blood-forming organs and certain disorders involving the immune mechanism: Secondary | ICD-10-CM | POA: Insufficient documentation

## 2014-03-10 DIAGNOSIS — O219 Vomiting of pregnancy, unspecified: Secondary | ICD-10-CM

## 2014-03-10 DIAGNOSIS — Z3490 Encounter for supervision of normal pregnancy, unspecified, unspecified trimester: Secondary | ICD-10-CM

## 2014-03-10 LAB — COMPREHENSIVE METABOLIC PANEL
ALT: 22 U/L (ref 0–35)
AST: 22 U/L (ref 0–37)
Albumin: 3.8 g/dL (ref 3.5–5.2)
Alkaline Phosphatase: 65 U/L (ref 39–117)
Anion gap: 13 (ref 5–15)
BUN: 8 mg/dL (ref 6–23)
CO2: 23 mEq/L (ref 19–32)
CREATININE: 0.86 mg/dL (ref 0.50–1.10)
Calcium: 9.6 mg/dL (ref 8.4–10.5)
Chloride: 101 mEq/L (ref 96–112)
GFR calc Af Amer: >90 mL/min (ref >90)
GLUCOSE: 75 mg/dL (ref 70–99)
Potassium: 3.4 mEq/L — ABNORMAL LOW (ref 3.7–5.3)
Sodium: 137 mEq/L (ref 137–147)
Total Bilirubin: 0.4 mg/dL (ref 0.3–1.2)
Total Protein: 7.3 g/dL (ref 6.0–8.3)

## 2014-03-10 LAB — URINALYSIS, ROUTINE W REFLEX MICROSCOPIC
BILIRUBIN URINE: NEGATIVE
Glucose, UA: NEGATIVE mg/dL
Hgb urine dipstick: NEGATIVE
KETONES UR: NEGATIVE mg/dL
Leukocytes, UA: NEGATIVE
NITRITE: NEGATIVE
Protein, ur: NEGATIVE mg/dL
Specific Gravity, Urine: 1.018 (ref 1.005–1.030)
Urobilinogen, UA: 0.2 mg/dL (ref 0.0–1.0)
pH: 7 (ref 5.0–8.0)

## 2014-03-10 LAB — WET PREP, GENITAL
TRICH WET PREP: NONE SEEN
Yeast Wet Prep HPF POC: NONE SEEN

## 2014-03-10 LAB — CBC WITH DIFFERENTIAL/PLATELET
Basophils Absolute: 0 10*3/uL (ref 0.0–0.1)
Basophils Relative: 0 % (ref 0–1)
Eosinophils Absolute: 0.1 10*3/uL (ref 0.0–0.7)
Eosinophils Relative: 1 % (ref 0–5)
HCT: 36.4 % (ref 36.0–46.0)
HEMOGLOBIN: 12.6 g/dL (ref 12.0–15.0)
LYMPHS PCT: 49 % — AB (ref 12–46)
Lymphs Abs: 3.1 10*3/uL (ref 0.7–4.0)
MCH: 29.6 pg (ref 26.0–34.0)
MCHC: 34.6 g/dL (ref 30.0–36.0)
MCV: 85.4 fL (ref 78.0–100.0)
Monocytes Absolute: 0.3 10*3/uL (ref 0.1–1.0)
Monocytes Relative: 5 % (ref 3–12)
NEUTROS ABS: 2.9 10*3/uL (ref 1.7–7.7)
Neutrophils Relative %: 45 % (ref 43–77)
Platelets: 364 10*3/uL (ref 150–400)
RBC: 4.26 MIL/uL (ref 3.87–5.11)
RDW: 14.4 % (ref 11.5–15.5)
WBC: 6.4 10*3/uL (ref 4.0–10.5)

## 2014-03-10 LAB — HCG, QUANTITATIVE, PREGNANCY: hCG, Beta Chain, Quant, S: 18213 m[IU]/mL — ABNORMAL HIGH (ref <5)

## 2014-03-10 LAB — LIPASE, BLOOD: LIPASE: 28 U/L (ref 11–59)

## 2014-03-10 LAB — POC URINE PREG, ED: Preg Test, Ur: POSITIVE — AB
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
## 2014-03-10 NOTE — ED Notes (Signed)
Pt reports lower abdominal pain with N/V since last night. Pt also reports increased urination and "at the end my pee looks kind of gray." Pt denies dysuria. Denies vaginal discharge. Pt in NAD.

## 2014-03-10 NOTE — ED Notes (Signed)
Patient transported to US 

## 2014-03-11 MED ORDER — PROMETHAZINE HCL 12.5 MG PO TABS: 12.5000 mg | ORAL_TABLET | Freq: Four times a day (QID) | ORAL | Status: DC | PRN

## 2014-03-11 NOTE — Discharge Instructions (Signed)
Take tylenol for pain. Take phenergan for nausea. Start prenatal vitamins. [facility] blood test. Follow up with your OB/GYN in 2 wks for ultrasound. [facility] related issues.    First Trimester of Pregnancy The first trimester of pregnancy is from week 1 until the end of week 12 (months 1 through 3). A week after a sperm fertilizes an egg, the egg will implant on the wall of the uterus. This embryo will begin to develop into a baby. Genes from you and your partner are forming the baby. The female genes determine whether the baby is a boy or a girl. At 6-8 weeks, the eyes and face are formed, and the heartbeat can be seen on ultrasound. At the end of 12 weeks, all the baby's organs are formed.  Now that you are pregnant, you will want to do everything you can to have a healthy baby. Two of the most important things are to get good prenatal care and to follow your health care provider's instructions. Prenatal care is all the medical care you receive before the baby's birth. This care will help prevent, find, and treat any problems during the pregnancy and childbirth. BODY CHANGES Your body goes through many changes during pregnancy. The changes vary from woman to woman.   You may gain or lose a couple of pounds at first.  You may feel sick to your stomach (nauseous) and throw up (vomit). If the vomiting is uncontrollable, call your health care provider.  You may tire easily.  You may develop headaches that can be relieved by medicines approved by your health care provider.  You may urinate more often. Painful urination may mean you have a bladder infection.  You may develop heartburn as a result of your pregnancy.  You may develop constipation because certain hormones are causing the muscles that push waste through your intestines to slow down.  You may develop hemorrhoids or swollen, bulging veins (varicose  veins).  Your breasts may begin to grow larger and become tender. Your nipples may stick out more, and the tissue that surrounds them (areola) may become darker.  Your gums may bleed and may be sensitive to brushing and flossing.  Dark spots or blotches (chloasma, mask of pregnancy) may develop on your face. This will likely fade after the baby is born.  Your menstrual periods will stop.  You may have a loss of appetite.  You may develop cravings for certain kinds of food.  You may have changes in your emotions from day to day, such as being excited to be pregnant or being concerned that something may go wrong with the pregnancy and baby.  You may have more vivid and strange dreams.  You may have changes in your hair. These can include thickening of your hair, rapid growth, and changes in texture. Some women also have hair loss during or after pregnancy, or hair that feels dry or thin. Your hair will most likely return to normal after your baby is born. WHAT TO EXPECT AT YOUR PRENATAL VISITS During a routine prenatal visit:  You will be weighed to make sure you and the baby are growing normally.  Your blood pressure will be taken.  Your abdomen will be measured to track your baby's growth.  The fetal heartbeat will be listened to starting around week 10 or 12 of your pregnancy.  Test results from any previous visits will be  discussed. Your health care provider may ask you:  How you are feeling.  If you are feeling the baby move.  If you have had any abnormal symptoms, such as leaking fluid, bleeding, severe headaches, or abdominal cramping.  If you have any questions. Other tests that may be performed during your first trimester include:  Blood tests to find your blood type and to check for the presence of any previous infections. They will also be used to check for low iron levels (anemia) and Rh antibodies. Later in the pregnancy, blood tests for diabetes will be done  along with other tests if problems develop.  Urine tests to check for infections, diabetes, or protein in the urine.  An ultrasound to confirm the proper growth and development of the baby.  An amniocentesis to check for possible genetic problems.  Fetal screens for spina bifida and Down syndrome.  You may need other tests to make sure you and the baby are doing well. HOME CARE INSTRUCTIONS  Medicines  Follow your health care provider's instructions regarding medicine use. Specific medicines may be either safe or unsafe to take during pregnancy.  Take your prenatal vitamins as directed.  If you develop constipation, try taking a stool softener if your health care provider approves. Diet  Eat regular, well-balanced meals. Choose a variety of foods, such as meat or vegetable-based protein, fish, milk and low-fat dairy products, vegetables, fruits, and whole grain breads and cereals. Your health care provider will help you determine the amount of weight gain that is right for you.  Avoid raw meat and uncooked cheese. These carry germs that can cause birth defects in the baby.  Eating four or five small meals rather than three large meals a day may help relieve nausea and vomiting. If you start to feel nauseous, eating a few soda crackers can be helpful. Drinking liquids between meals instead of during meals also seems to help nausea and vomiting.  If you develop constipation, eat more high-fiber foods, such as fresh vegetables or fruit and whole grains. Drink enough fluids to keep your urine clear or pale yellow. Activity and Exercise  Exercise only as directed by your health care provider. Exercising will help you:  Control your weight.  Stay in shape.  Be prepared for labor and delivery.  Experiencing pain or cramping in the lower abdomen or low back is a good sign that you should stop exercising. Check with your health care provider before continuing normal exercises.  Try to  avoid standing for long periods of time. Move your legs often if you must stand in one place for a long time.  Avoid heavy lifting.  Wear low-heeled shoes, and practice good posture.  You may continue to have sex unless your health care provider directs you otherwise. Relief of Pain or Discomfort  Wear a good support bra for breast tenderness.   Take warm sitz baths to soothe any pain or discomfort caused by hemorrhoids. Use hemorrhoid cream if your health care provider approves.   Rest with your legs elevated if you have leg cramps or low back pain.  If you develop varicose veins in your legs, wear support hose. Elevate your feet for 15 minutes, 3-4 times a day. Limit salt in your diet. Prenatal Care  Schedule your prenatal visits by the twelfth week of pregnancy. They are usually scheduled monthly at first, then more often in the last 2 months before delivery.  Write down your questions. Take them to your prenatal  visits.  Keep all your prenatal visits as directed by your health care provider. Safety  Wear your seat belt at all times when driving.  Make a list of emergency phone numbers, including numbers for family, friends, the hospital, and police and fire departments. General Tips  Ask your health care provider for a referral to a local prenatal education class. Begin classes no later than at the beginning of month 6 of your pregnancy.  Ask for help if you have counseling or nutritional needs during pregnancy. Your health care provider can offer advice or refer you to specialists for help with various needs.  Do not use hot tubs, steam rooms, or saunas.  Do not douche or use tampons or scented sanitary pads.  Do not cross your legs for long periods of time.  Avoid cat litter boxes and soil used by cats. These carry germs that can cause birth defects in the baby and possibly loss of the fetus by miscarriage or stillbirth.  Avoid all smoking, herbs, alcohol, and  medicines not prescribed by your health care provider. Chemicals in these affect the formation and growth of the baby.  Schedule a dentist appointment. At home, brush your teeth with a soft toothbrush and be gentle when you floss. SEEK MEDICAL CARE IF:   You have dizziness.  You have mild pelvic cramps, pelvic pressure, or nagging pain in the abdominal area.  You have persistent nausea, vomiting, or diarrhea.  You have a bad smelling vaginal discharge.  You have pain with urination.  You notice increased swelling in your face, hands, legs, or ankles. SEEK IMMEDIATE MEDICAL CARE IF:   You have a fever.  You are leaking fluid from your vagina.  You have spotting or bleeding from your vagina.  You have severe abdominal cramping or pain.  You have rapid weight gain or loss.  You vomit blood or material that looks like coffee grounds.  You are exposed to [place] measles and have never had them.  You are exposed to fifth disease or chickenpox.  You develop a severe headache.  You have shortness of breath.  You have any kind of trauma, such as from a fall or a car accident. Document Released: 06/13/2001 Document Revised: 11/03/2013 Document Reviewed: 04/29/2013 [facility] Patient Information 2015 [place], [place]. This information is not intended to replace advice given to you by your health care provider. Make sure you discuss any questions you have with your health care provider.

## 2014-03-12 LAB — GC/CHLAMYDIA PROBE AMP
CT Probe RNA: NEGATIVE
GC PROBE AMP APTIMA: NEGATIVE

## 2014-03-13 NOTE — ED Provider Notes (Signed)
Medical screening examination/treatment/procedure(s) were conducted as a shared visit with non-physician practitioner(s) and myself.  I personally evaluated the patient during the encounter.   EKG Interpretation None       Pt with abd pain. Noted to be pregnant. Pt unaware of pregnancy status. She is having no tenderness on my exam, and denies any current vaginal bleeding . [place] ordered, and pt to be discharged, if she stays stable.  Derwood Kaplan, MD 03/13/14 0021

## 2014-03-27 LAB — HEMOGLOBIN EVAL RFX ELECTROPHORESIS: Hemoglobin Evaluation: NORMAL

## 2014-05-04 ENCOUNTER — Encounter (HOSPITAL_COMMUNITY): Payer: Self-pay | Admitting: Emergency Medicine

## 2015-02-10 ENCOUNTER — Encounter: Payer: Self-pay | Admitting: Medical Oncology

## 2015-02-10 ENCOUNTER — Emergency Department
Admission: EM | Admit: 2015-02-10 | Discharge: 2015-02-10 | Disposition: A | Discharge status: Home / Self Care | Payer: Medicaid Other | Attending: Emergency Medicine | Admitting: Emergency Medicine

## 2015-02-10 DIAGNOSIS — Z202 Contact with and (suspected) exposure to infections with a predominantly sexual mode of transmission: Secondary | ICD-10-CM | POA: Diagnosis present

## 2015-02-10 DIAGNOSIS — Z3202 Encounter for pregnancy test, result negative: Secondary | ICD-10-CM | POA: Diagnosis not present

## 2015-02-10 DIAGNOSIS — N760 Acute vaginitis: Secondary | ICD-10-CM | POA: Diagnosis not present

## 2015-02-10 DIAGNOSIS — Z720 Tobacco use: Secondary | ICD-10-CM | POA: Diagnosis not present

## 2015-02-10 LAB — URINALYSIS COMPLETE WITH MICROSCOPIC (ARMC ONLY)
BACTERIA UA: NONE SEEN
Bilirubin Urine: NEGATIVE
GLUCOSE, UA: NEGATIVE mg/dL
Hgb urine dipstick: NEGATIVE
Ketones, ur: NEGATIVE mg/dL
NITRITE: NEGATIVE
PROTEIN: NEGATIVE mg/dL
Specific Gravity, Urine: 1.02 (ref 1.005–1.030)
pH: 6 (ref 5.0–8.0)

## 2015-02-10 LAB — WET PREP, GENITAL
CLUE CELLS WET PREP: NONE SEEN
TRICH WET PREP: NONE SEEN

## 2015-02-10 LAB — CHLAMYDIA/NGC RT PCR (ARMC ONLY)
Chlamydia Tr: NOT DETECTED
N GONORRHOEAE: NOT DETECTED

## 2015-02-10 MED ORDER — FLUCONAZOLE 150 MG PO TABS: 150.0000 mg | ORAL_TABLET | Freq: Every day | ORAL | Status: DC

## 2015-02-10 MED ORDER — CEFTRIAXONE SODIUM 250 MG IJ SOLR
250.0000 mg | Freq: Once | INTRAMUSCULAR | Status: AC
  Administered 2015-02-10: 250 mg via INTRAMUSCULAR
  Filled 2015-02-10: qty 250

## 2015-02-10 MED ORDER — AZITHROMYCIN 250 MG PO TABS
1000.0000 mg | ORAL_TABLET | Freq: Once | ORAL | Status: AC
  Administered 2015-02-10: 1000 mg via ORAL
  Filled 2015-02-10: qty 4

## 2015-02-10 NOTE — ED Provider Notes (Signed)
[facility] Emergency Department Provider Note  ____________________________________________  Time seen:  8:18 AM  I have reviewed the triage vital signs and the nursing notes.   HISTORY  Chief Complaint SEXUALLY TRANSMITTED DISEASE   HPI Joann King is a 24 y.o. female is here today with complaint of vaginal itching and odor with discharge for approximately one week. She states she had unprotected sex with someone 2 weeks ago. She is not taking birth control. She denies any pain at this time. She states that she discharged is worse today. She denies any nausea vomiting, no fever or chills. She does state that she does have some vaginal itching. She has not used any over-the-counter medication for this.   Past Medical History  Diagnosis Date  . Herpes genitalia     doesn't think ever had outbreak  . Anemia     during first pregnancy  . Anemia   . Heartburn in pregnancy     Patient Active Problem List   Diagnosis Date Noted  . Status post cesarean delivery 10/08/2013  . Supervision of normal subsequent pregnancy 10/07/2013  . Previous cesarean delivery, delivered 10/07/2013  . Alcohol use complicating pregnancy in third trimester 10/07/2013    Past Surgical History  Procedure Laterality Date  . Cesarean section  2009  . Cesarean section N/A 10/07/2013    Procedure: CESAREAN SECTION;  Surgeon: Lesly Dukes, MD;  Location: [facility];  Service: Obstetrics;  Laterality: N/A;    Current Outpatient Rx  Name  Route  Sig  Dispense  Refill  . fluconazole (DIFLUCAN) 150 MG tablet   Oral   Take 1 tablet (150 mg total) by mouth daily.   1 tablet   0   . promethazine (PHENERGAN) 12.5 MG tablet   Oral   Take 1 tablet (12.5 mg total) by mouth every 6 (six) hours as needed for nausea or vomiting.   12 tablet   0     Allergies Review of patient's allergies indicates no known allergies.  No family history on file.  Social History Social History   Substance Use Topics  . Smoking status: Current Every Day Smoker -- 0.50 packs/day    Types: Cigarettes  . Smokeless tobacco: Never Used  . Alcohol Use: No    Review of Systems Constitutional: No fever/chills ENT: No sore throat. Cardiovascular: Denies chest pain. Respiratory: Denies shortness of breath. Gastrointestinal: No abdominal pain.  No nausea, no vomiting. Genitourinary: Negative for dysuria. Positive vaginal itching Musculoskeletal: Negative for back pain. Skin: Negative for rash. Neurological: Negative for headaches, focal weakness or numbness.  10-point ROS otherwise negative.  ____________________________________________   PHYSICAL EXAM:  VITAL SIGNS: ED Triage Vitals  Enc Vitals Group     BP 02/10/15 0808 124/84 mmHg     Pulse Rate 02/10/15 0808 69     Resp 02/10/15 0808 17     Temp 02/10/15 0808 98.3 F (36.8 C)     Temp Source 02/10/15 0808 Oral     SpO2 02/10/15 0808 100 %     Weight 02/10/15 0808 181 lb (82.101 kg)     Height 02/10/15 0808  (1.651 m)     Head Cir --      Peak Flow --      Pain Score --      Pain Loc --      Pain Edu? --      Excl. in GC? --     Constitutional: Alert and oriented.  Well appearing and in no acute distress. Eyes: Conjunctivae are normal. PERRL. EOMI. Head: Atraumatic. Nose: No congestion/rhinnorhea. Neck: No stridor.   Cardiovascular: Normal rate, regular rhythm. Grossly normal heart sounds.  Good peripheral circulation. Respiratory: Normal respiratory effort.  No retractions. Lungs CTAB. Gastrointestinal: Soft and nontender. No distention. No abdominal bruits. No CVA tenderness. Genitourinary: Pelvic exam was done with copious amounts of vaginal discharge noted which is thick and white. Positive for odor. There is no adnexal masses or tenderness. There is tenderness on cervical motion. Musculoskeletal: No lower extremity tenderness nor edema.  No joint effusions. Neurologic:  Normal speech and language. No  gross focal neurologic deficits are appreciated. No gait instability. Skin:  Skin is warm, dry and intact. No rash noted. Psychiatric: Mood and affect are normal. Speech and behavior are normal.  ____________________________________________   LABS (all labs ordered are listed, but only abnormal results are displayed)  Labs Reviewed  WET PREP, GENITAL - Abnormal; Notable for the following:    Yeast Wet Prep HPF POC FEW (*)    WBC, Wet Prep HPF POC MANY (*)    All other components within normal limits  URINALYSIS COMPLETEWITH MICROSCOPIC ([facility] ONLY) - Abnormal; Notable for the following:    Color, Urine YELLOW (*)    APPearance CLEAR (*)    Leukocytes, UA TRACE (*)    Squamous Epithelial / LPF 0-5 (*)    All other components within normal limits  CHLAMYDIA/NGC RT PCR ([facility] ONLY)  POC URINE PREG, ED     PROCEDURES  Procedure(s) performed: None  Critical Care performed: No  ____________________________________________   INITIAL IMPRESSION / ASSESSMENT AND PLAN / ED COURSE  Pertinent labs & imaging results that were available during my care of the patient were reviewed by me and considered in my medical decision making (see chart for details).  Given the patient's history and the possibility of her having a sexually transmitted disease extremely high. Patient was told that her test results  for GC and Chlamydia would not be back for several hours. She was treated with Rocephin IM and Zithromax by mouth. She is to follow-up with health Department. ____________________________________________   FINAL CLINICAL IMPRESSION(S) / ED DIAGNOSES  Final diagnoses:  Vaginitis and vulvovaginitis      Tommi Rumps, PA-C 02/10/15 1109  Tommi Rumps, PA-C 02/10/15 1201  Emily Filbert, MD 02/10/15 1504

## 2015-02-10 NOTE — ED Notes (Signed)
POC urine preg resulted negative. CBG machine is not registering patient at this time

## 2015-02-10 NOTE — ED Notes (Signed)
Pt reports that she has been having vaginal itching and odor to discharge x 1 week. Denies pain.

## 2015-06-29 ENCOUNTER — Emergency Department (HOSPITAL_COMMUNITY)
Admission: EM | Admit: 2015-06-29 | Discharge: 2015-06-29 | Disposition: A | Discharge status: Home / Self Care | Payer: Medicaid Other | Attending: Emergency Medicine | Admitting: Emergency Medicine

## 2015-06-29 ENCOUNTER — Encounter (HOSPITAL_COMMUNITY): Payer: Self-pay | Admitting: *Deleted

## 2015-06-29 DIAGNOSIS — Z3202 Encounter for pregnancy test, result negative: Secondary | ICD-10-CM | POA: Insufficient documentation

## 2015-06-29 DIAGNOSIS — B9689 Other specified bacterial agents as the cause of diseases classified elsewhere: Secondary | ICD-10-CM

## 2015-06-29 DIAGNOSIS — F17210 Nicotine dependence, cigarettes, uncomplicated: Secondary | ICD-10-CM | POA: Insufficient documentation

## 2015-06-29 DIAGNOSIS — N760 Acute vaginitis: Secondary | ICD-10-CM | POA: Insufficient documentation

## 2015-06-29 DIAGNOSIS — Z862 Personal history of diseases of the blood and blood-forming organs and certain disorders involving the immune mechanism: Secondary | ICD-10-CM | POA: Insufficient documentation

## 2015-06-29 LAB — URINALYSIS, ROUTINE W REFLEX MICROSCOPIC
BILIRUBIN URINE: NEGATIVE
Glucose, UA: NEGATIVE mg/dL
Hgb urine dipstick: NEGATIVE
KETONES UR: NEGATIVE mg/dL
NITRITE: NEGATIVE
PH: 6 (ref 5.0–8.0)
Protein, ur: NEGATIVE mg/dL
Specific Gravity, Urine: 1.026 (ref 1.005–1.030)

## 2015-06-29 LAB — WET PREP, GENITAL
Sperm: NONE SEEN
Trich, Wet Prep: NONE SEEN

## 2015-06-29 LAB — URINE MICROSCOPIC-ADD ON: RBC / HPF: NONE SEEN RBC/hpf (ref 0–5)

## 2015-06-29 LAB — POC URINE PREG, ED: Preg Test, Ur: NEGATIVE

## 2015-06-29 MED ORDER — METRONIDAZOLE 500 MG PO TABS: 500.0000 mg | ORAL_TABLET | Freq: Two times a day (BID) | ORAL | Status: DC

## 2015-06-29 MED ORDER — AZITHROMYCIN 250 MG PO TABS
1000.0000 mg | ORAL_TABLET | Freq: Once | ORAL | Status: AC
  Administered 2015-06-29: 1000 mg via ORAL
  Filled 2015-06-29: qty 4

## 2015-06-29 MED ORDER — STERILE WATER FOR INJECTION IJ SOLN
INTRAMUSCULAR | Status: AC
  Filled 2015-06-29: qty 10

## 2015-06-29 MED ORDER — CEFTRIAXONE SODIUM 250 MG IJ SOLR
250.0000 mg | Freq: Once | INTRAMUSCULAR | Status: AC
  Administered 2015-06-29: 250 mg via INTRAMUSCULAR
  Filled 2015-06-29: qty 250

## 2015-06-29 NOTE — ED Notes (Signed)
Pelvic cart at bedside. 

## 2015-06-29 NOTE — ED Notes (Signed)
C/o vaginal itching and dishcharge with odor

## 2015-06-29 NOTE — ED Provider Notes (Signed)
CSN: [id number]     Arrival date & time 06/29/15  [id number] History   First MD Initiated Contact with Patient 06/29/15 1055     Chief Complaint  Patient presents with  . Vaginal Itching    Patient is a 24 y.o. female presenting with vaginal itching. The history is provided by the patient.  Vaginal Itching This is a new problem. The current episode started more than 2 days ago. The problem occurs constantly. The problem has not changed since onset.Pertinent negatives include no chest pain, no abdominal pain, no headaches and no shortness of breath. Nothing relieves the symptoms. She has tried nothing for the symptoms.  Pt would have seen her doctor but the office is closed.  No sores noted. Yelow discharge noted.  New sexual partner, no condom use Past Medical History  Diagnosis Date  . Herpes genitalia     doesn't think ever had outbreak  . Anemia     during first pregnancy  . Anemia   . Heartburn in pregnancy    Past Surgical History  Procedure Laterality Date  . Cesarean section  2009  . Cesarean section N/A 10/07/2013    Procedure: CESAREAN SECTION;  Surgeon: Lesly DukesKelly H Leggett, MD;  Location: [facility];  Service: Obstetrics;  Laterality: N/A;   History reviewed. No pertinent family history. Social History  Substance Use Topics  . Smoking status: Current Every Day Smoker -- 0.50 packs/day    Types: Cigarettes  . Smokeless tobacco: Never Used  . Alcohol Use: Yes   OB History    Gravida Para Term Preterm AB TAB SAB Ectopic Multiple Living   3 2 2  1     2      Review of Systems  Respiratory: Negative for shortness of breath.   Cardiovascular: Negative for chest pain.  Gastrointestinal: Negative for abdominal pain.  Neurological: Negative for headaches.  All other systems reviewed and are negative.     Allergies  Review of patient's allergies indicates no known allergies.  Home Medications   Prior to Admission medications   Medication Sig Start Date End Date Taking?  Authorizing Provider  guaiFENesin (MUCINEX) 600 MG 12 hr tablet Take 600 mg by mouth 2 (two) times daily as needed for cough.   Yes Historical Provider, MD  fluconazole (DIFLUCAN) 150 MG tablet Take 1 tablet (150 mg total) by mouth daily. Patient not taking: Reported on 06/29/2015 02/10/15   Tommi Rumpshonda L Summers, PA-C  metroNIDAZOLE (FLAGYL) 500 MG tablet Take 1 tablet (500 mg total) by mouth 2 (two) times daily. 06/29/15   Linwood DibblesJon Kamilla Hands, MD  promethazine (PHENERGAN) 12.5 MG tablet Take 1 tablet (12.5 mg total) by mouth every 6 (six) hours as needed for nausea or vomiting. Patient not taking: Reported on 06/29/2015 03/11/14   Tatyana Kirichenko, PA-C   BP 107/88 mmHg  Pulse 76  Temp(Src) 98.6 F (37 C) (Oral)  Resp 16  Ht 5\' 6"  (1.676 m)  Wt 87.998 kg  BMI 31.33 kg/m2  SpO2 100%  LMP 06/13/2015 Physical Exam  Constitutional: She appears well-developed and well-nourished. No distress.  HENT:  Head: Normocephalic and atraumatic.  Right Ear: External ear normal.  Left Ear: External ear normal.  Eyes: Conjunctivae are normal. Right eye exhibits no discharge. Left eye exhibits no discharge. No scleral icterus.  Neck: Neck supple. No tracheal deviation present.  Cardiovascular: Normal rate.   Pulmonary/Chest: Effort normal. No stridor. No respiratory distress.  Abdominal: Soft. Bowel sounds are normal. She exhibits no distension. There  is no tenderness. There is no rebound and no guarding.  Genitourinary: Uterus normal. Pelvic exam was performed with patient supine. There is no rash, tenderness or lesion on the right labia. There is no rash, tenderness or lesion on the left labia. Cervix exhibits no motion tenderness and no discharge. Right adnexum displays no mass, no tenderness and no fullness. Left adnexum displays no mass, no tenderness and no fullness. Vaginal discharge (white) found.  Musculoskeletal: She exhibits no edema.  Neurological: She is alert. Cranial nerve deficit: no gross deficits.   Skin: Skin is warm and dry. No rash noted.  Psychiatric: She has a normal mood and affect.  Nursing note and vitals reviewed.   ED Course  Procedures (including critical care time) Labs Review Labs Reviewed  WET PREP, GENITAL - Abnormal; Notable for the following:    Yeast Wet Prep HPF POC PRESENT (*)    Clue Cells Wet Prep HPF POC PRESENT (*)    WBC, Wet Prep HPF POC MANY (*)    All other components within normal limits  URINALYSIS, ROUTINE W REFLEX MICROSCOPIC (NOT AT [facility]) - Abnormal; Notable for the following:    Leukocytes, UA SMALL (*)    All other components within normal limits  URINE MICROSCOPIC-ADD ON - Abnormal; Notable for the following:    Squamous Epithelial / LPF 6-30 (*)    Bacteria, UA FEW (*)    All other components within normal limits  RPR  HIV ANTIBODY (ROUTINE TESTING)  POC URINE PREG, ED  GC/CHLAMYDIA PROBE AMP ([place]) NOT AT [facility]      MDM   Final diagnoses:  Bacterial vaginosis    Will treat for BV and Empirically for std .  Follow up with an ob gyn doctor.       Linwood Dibbles, MD 06/29/15 1325

## 2015-06-29 NOTE — Discharge Instructions (Signed)

## 2015-06-30 LAB — RPR: RPR Ser Ql: NONREACTIVE

## 2015-06-30 LAB — HIV ANTIBODY (ROUTINE TESTING W REFLEX): HIV Screen 4th Generation wRfx: NONREACTIVE

## 2015-07-03 LAB — GC/CHLAMYDIA PROBE AMP (~~LOC~~) NOT AT ARMC
CHLAMYDIA, DNA PROBE: NEGATIVE
Neisseria Gonorrhea: NEGATIVE

## 2015-07-05 ENCOUNTER — Encounter (HOSPITAL_COMMUNITY): Payer: Self-pay | Admitting: Emergency Medicine

## 2015-07-05 ENCOUNTER — Emergency Department (HOSPITAL_COMMUNITY)
Admission: EM | Admit: 2015-07-05 | Discharge: 2015-07-05 | Disposition: A | Discharge status: Home / Self Care | Payer: Medicaid Other | Attending: Emergency Medicine | Admitting: Emergency Medicine

## 2015-07-05 DIAGNOSIS — Z8619 Personal history of other infectious and parasitic diseases: Secondary | ICD-10-CM | POA: Insufficient documentation

## 2015-07-05 DIAGNOSIS — K047 Periapical abscess without sinus: Secondary | ICD-10-CM | POA: Insufficient documentation

## 2015-07-05 DIAGNOSIS — K029 Dental caries, unspecified: Secondary | ICD-10-CM

## 2015-07-05 DIAGNOSIS — K002 Abnormalities of size and form of teeth: Secondary | ICD-10-CM | POA: Insufficient documentation

## 2015-07-05 DIAGNOSIS — Z720 Tobacco use: Secondary | ICD-10-CM

## 2015-07-05 DIAGNOSIS — Z862 Personal history of diseases of the blood and blood-forming organs and certain disorders involving the immune mechanism: Secondary | ICD-10-CM | POA: Insufficient documentation

## 2015-07-05 DIAGNOSIS — F17210 Nicotine dependence, cigarettes, uncomplicated: Secondary | ICD-10-CM | POA: Insufficient documentation

## 2015-07-05 MED ORDER — NAPROXEN 500 MG PO TABS: 500.0000 mg | ORAL_TABLET | Freq: Two times a day (BID) | ORAL | Status: DC | PRN

## 2015-07-05 MED ORDER — HYDROCODONE-ACETAMINOPHEN 5-325 MG PO TABS
1.0000 | ORAL_TABLET | Freq: Once | ORAL | Status: AC
  Administered 2015-07-05: 1 via ORAL
  Filled 2015-07-05: qty 1

## 2015-07-05 MED ORDER — DOXYCYCLINE HYCLATE 100 MG PO CAPS: 100.0000 mg | ORAL_CAPSULE | Freq: Two times a day (BID) | ORAL | Status: DC

## 2015-07-05 MED ORDER — HYDROCODONE-ACETAMINOPHEN 5-325 MG PO TABS: 1.0000 | ORAL_TABLET | Freq: Four times a day (QID) | ORAL | Status: DC | PRN

## 2015-07-05 NOTE — ED Notes (Signed)
Pt c/o lower right dental pain, states she has "hole" in her tooth. Pt has appointment on Wednesday but requests medications to alleviate pain until then.

## 2015-07-05 NOTE — ED Provider Notes (Signed)
CSN: [id number]     Arrival date & time 07/05/15  1624 History   First MD Initiated Contact with Patient 07/05/15 1746     Chief Complaint  Patient presents with  . Dental Pain     (Consider location/radiation/quality/duration/timing/severity/associated sxs/prior Treatment) HPI Comments: Joann King is a 25 y.o. female with a PMHx of anemia, who presents to the ED with complaints of gradual onset right lower molar pain 1 day. She is independent 10/10 constant throbbing nonradiating pain, worse with chewing, and unrelieved with Tylenol. She has remained appointment with a dentist on Wednesday. She has had a hole in that tooth for quite some time. She is a smoker. She denies fevers, chills, face/neck swelling or pain, gum swelling/drainage, trismus, drooling, ear pain/drainage, CP, SOB, abd pain, N/V/D/C, hematuria, dysuria, myalgias, arthralgias, numbness, tingling, weakness, or any other symptoms. No hx of diabetes, HIV, or other immunosuppressing conditions.  Patient is a 25 y.o. female presenting with tooth pain. The history is provided by the patient. No language interpreter was used.  Dental Pain Location:  Lower Lower teeth location:  31/RL 2nd molar Quality:  Throbbing Severity:  Severe Onset quality:  Gradual Duration:  1 day Timing:  Constant Progression:  Unchanged Chronicity:  Recurrent Context: dental caries and dental fracture   Relieved by:  Nothing Worsened by:  Pressure Ineffective treatments:  Acetaminophen Associated symptoms: no difficulty swallowing, no drooling, no facial swelling, no fever, no gum swelling, no neck pain, no neck swelling, no oral bleeding, no oral lesions and no trismus   Risk factors: smoking   Risk factors: no immunosuppression     Past Medical History  Diagnosis Date  . Herpes genitalia     doesn't think ever had outbreak  . Anemia     during first pregnancy  . Anemia   . Heartburn in pregnancy    Past Surgical History  Procedure  Laterality Date  . Cesarean section  2009  . Cesarean section N/A 10/07/2013    Procedure: CESAREAN SECTION;  Surgeon: Lesly Dukes, MD;  Location: [facility];  Service: Obstetrics;  Laterality: N/A;   History reviewed. No pertinent family history. Social History  Substance Use Topics  . Smoking status: Current Every Day Smoker -- 0.50 packs/day    Types: Cigarettes  . Smokeless tobacco: Never Used  . Alcohol Use: Yes   OB History    Gravida Para Term Preterm AB TAB SAB Ectopic Multiple Living   3 2 2  1     2      Review of Systems  Constitutional: Negative for fever and chills.  HENT: Positive for dental problem. Negative for drooling, ear discharge, ear pain, facial swelling, mouth sores, sore throat and trouble swallowing.   Respiratory: Negative for shortness of breath.   Cardiovascular: Negative for chest pain.  Gastrointestinal: Negative for nausea, vomiting, abdominal pain, diarrhea and constipation.  Genitourinary: Negative for dysuria and hematuria.  Musculoskeletal: Negative for myalgias, arthralgias, neck pain and neck stiffness.  Skin: Negative for color change.  Allergic/Immunologic: Negative for immunocompromised state.  Neurological: Negative for weakness and numbness.  Psychiatric/Behavioral: Negative for confusion.   10 Systems reviewed and are negative for acute change except as noted in the HPI.    Allergies  Review of patient's allergies indicates no known allergies.  Home Medications   Prior to Admission medications   Medication Sig Start Date End Date Taking? Authorizing Provider  fluconazole (DIFLUCAN) 150 MG tablet Take 1 tablet (150 mg total) by  mouth daily. Patient not taking: Reported on 06/29/2015 02/10/15   Tommi Rumps, PA-C  guaiFENesin (MUCINEX) 600 MG 12 hr tablet Take 600 mg by mouth 2 (two) times daily as needed for cough.    Historical Provider, MD  metroNIDAZOLE (FLAGYL) 500 MG tablet Take 1 tablet (500 mg total) by mouth 2 (two) times  daily. 06/29/15   Linwood Dibbles, MD  promethazine (PHENERGAN) 12.5 MG tablet Take 1 tablet (12.5 mg total) by mouth every 6 (six) hours as needed for nausea or vomiting. Patient not taking: Reported on 06/29/2015 03/11/14   Tatyana Kirichenko, PA-C   BP 142/95 mmHg  Pulse 69  Temp(Src) 98.2 F (36.8 C) (Oral)  Resp 16  SpO2 99%  LMP 06/13/2015 Physical Exam  Constitutional: She is oriented to person, place, and time. Vital signs are normal. She appears well-developed and well-nourished.  Non-toxic appearance. No distress.  Afebrile, nontoxic, NAD  HENT:  Head: Normocephalic and atraumatic.  Nose: Nose normal.  Mouth/Throat: Uvula is midline, oropharynx is clear and moist and mucous membranes are normal. No trismus in the jaw. Abnormal dentition. Dental caries present. No dental abscesses or uvula swelling.    R lower molar #31 decayed with large hole in tooth without exposed roots, with surrounding gingival erythema without obvious abscess formation. No evidence of ludwig's. No gum drainage. Nose clear. Oropharynx clear and moist, without uvular swelling or deviation, no trismus or drooling, no tonsillar swelling or erythema, no exudates.    Eyes: Conjunctivae and EOM are normal. Right eye exhibits no discharge. Left eye exhibits no discharge.  Neck: Normal range of motion. Neck supple.  Cardiovascular: Normal rate.   Pulmonary/Chest: Effort normal. No respiratory distress.  Abdominal: Normal appearance. She exhibits no distension.  Musculoskeletal: Normal range of motion.  Neurological: She is alert and oriented to person, place, and time. She has normal strength. No sensory deficit.  Skin: Skin is warm, dry and intact. No rash noted.  Psychiatric: She has a normal mood and affect. Her behavior is normal.  Nursing note and vitals reviewed.   ED Course  Procedures (including critical care time) Labs Review Labs Reviewed - No data to display  Imaging Review No results found. I have  personally reviewed and evaluated these images and lab results as part of my medical decision-making.   EKG Interpretation None      MDM   Final diagnoses:  Dental decay  Pain due to dental caries  Dental infection  Tobacco use    25 y.o. female here with Dental pain associated with dental decay and possible dental infection with patient afebrile, non toxic appearing and swallowing secretions well. I gave patient referral to dentist and stressed the importance of dental follow up for ultimate management of dental pain.  I have also discussed reasons to return immediately to the ER.  Patient expresses understanding and agrees with plan.  I think it's reasonable to give a few tablets of narcotic pain medication given the hole in her tooth and amount of decay, and the fact that she does not have many recent visits for same complaint. I will give doxycycline and pain control.  Smoking cessation discussed.   BP 142/95 mmHg  Pulse 69  Temp(Src) 98.2 F (36.8 C) (Oral)  Resp 16  SpO2 99%  LMP 06/13/2015  Meds ordered this encounter  Medications  . HYDROcodone-acetaminophen (NORCO/VICODIN) 5-325 MG per tablet 1 tablet    Sig:   . doxycycline (VIBRAMYCIN) 100 MG capsule  Sig: Take 1 capsule (100 mg total) by mouth 2 (two) times daily. One po bid x 7 days    Dispense:  14 capsule    Refill:  0    Order Specific Question:  Supervising Provider    Answer:  MILLER, BRIAN [[id number]]  . HYDROcodone-acetaminophen (NORCO) 5-325 MG tablet    Sig: Take 1 tablet by mouth every 6 (six) hours as needed for severe pain.    Dispense:  6 tablet    Refill:  0    Order Specific Question:  Supervising Provider    Answer:  MILLER, BRIAN [[id number]]  . naproxen (NAPROSYN) 500 MG tablet    Sig: Take 1 tablet (500 mg total) by mouth 2 (two) times daily as needed for mild pain, moderate pain or headache (TAKE WITH MEALS.).    Dispense:  20 tablet    Refill:  0    Order Specific Question:  Supervising Provider      Answer:  Eber HongMILLER, BRIAN [[id number]]     Mabeline Varas Camprubi-Soms, PA-C 07/05/15 1804  Nelva Nayobert Beaton, MD 07/10/15 0900

## 2015-07-05 NOTE — Discharge Instructions (Signed)
Apply warm compresses to jaw throughout the day. Take antibiotic until finished. Take naprosyn and norco as directed, as needed for pain but do not drive or operate machinery with pain medication use. Followup with a dentist is very important for ongoing evaluation and management of recurrent dental pain. Follow up with your dentist on Wednesday. STOP SMOKING! Return to emergency department for emergent changing or worsening symptoms.   Dental Caries Dental caries (also called tooth decay) is the most common oral disease. It can occur at any age but is more common in children and young adults.  HOW DENTAL CARIES DEVELOPS  The process of decay begins when bacteria and foods (particularly sugars and starches) combine in your mouth to produce plaque. Plaque is a substance that sticks to the hard, outer surface of a tooth (enamel). The bacteria in plaque produce acids that attack enamel. These acids may also attack the root surface of a tooth (cementum) if it is exposed. Repeated attacks dissolve these surfaces and create holes in the tooth (cavities). If left untreated, the acids destroy the other layers of the tooth.  RISK FACTORS  Frequent sipping of sugary beverages.   Frequent snacking on sugary and starchy foods, especially those that easily get stuck in the teeth.   Poor oral hygiene.   Dry mouth.   Substance abuse such as methamphetamine abuse.   Broken or poor-fitting dental restorations.   Eating disorders.   Gastroesophageal reflux disease (GERD).   Certain radiation treatments to the head and neck. SYMPTOMS In the early stages of dental caries, symptoms are seldom present. Sometimes white, chalky areas may be seen on the enamel or other tooth layers. In later stages, symptoms may include:  Pits and holes on the enamel.  Toothache after sweet, hot, or cold foods or drinks are consumed.  Pain around the tooth.  Swelling around the tooth. DIAGNOSIS  Most of the time,  dental caries is detected during a regular dental checkup. A diagnosis is made after a thorough medical and dental history is taken and the surfaces of your teeth are checked for signs of dental caries. Sometimes special instruments, such as lasers, are used to check for dental caries. Dental X-ray exams may be taken so that areas not visible to the eye (such as between the contact areas of the teeth) can be checked for cavities.  TREATMENT  If dental caries is in its early stages, it may be reversed with a fluoride treatment or an application of a remineralizing agent at the dental office. Thorough brushing and flossing at home is needed to aid these treatments. If it is in its later stages, treatment depends on the location and extent of tooth destruction:   If a small area of the tooth has been destroyed, the destroyed area will be removed and cavities will be filled with a material such as gold, silver amalgam, or composite resin.   If a large area of the tooth has been destroyed, the destroyed area will be removed and a cap (crown) will be fitted over the remaining tooth structure.   If the center part of the tooth (pulp) is affected, a procedure called a root canal will be needed before a filling or crown can be placed.   If most of the tooth has been destroyed, the tooth may need to be pulled (extracted). HOME CARE INSTRUCTIONS You can prevent, stop, or reverse dental caries at home by practicing good oral hygiene. Good oral hygiene includes:  Thoroughly cleaning your  teeth at least twice a day with a toothbrush and dental floss.   Using a fluoride toothpaste. A fluoride mouth rinse may also be used if recommended by your dentist or health care provider.   Restricting the amount of sugary and starchy foods and sugary liquids you consume.   Avoiding frequent snacking on these foods and sipping of these liquids.   Keeping regular visits with a dentist for checkups and  cleanings. PREVENTION   Practice good oral hygiene.  Consider a dental sealant. A dental sealant is a coating material that is applied by your dentist to the pits and grooves of teeth. The sealant prevents food from being trapped in them. It may protect the teeth for several years.  Ask about fluoride supplements if you live in a community without fluorinated water or with water that has a low fluoride content. Use fluoride supplements as directed by your dentist or health care provider.  Allow fluoride varnish applications to teeth if directed by your dentist or health care provider.   This information is not intended to replace advice given to you by your health care provider. Make sure you discuss any questions you have with your health care provider.   Document Released: 03/11/2002 Document Revised: 07/10/2014 Document Reviewed: 06/21/2012 Elsevier Interactive Patient Education 2016 [employer]  Dental Abscess A dental abscess is pus in or around a tooth. HOME CARE  Take medicines only as told by your dentist.  If you were prescribed antibiotic medicine, finish all of it even if you start to feel better.  Rinse your mouth (gargle) often with salt water.  Do not drive or use heavy machinery, like a lawn mower, while taking pain medicine.  Do not apply heat to the outside of your mouth.  Keep all follow-up visits as told by your dentist. This is important. GET HELP IF:  Your pain is worse, and medicine does not help. GET HELP RIGHT AWAY IF:  You have a fever or chills.  Your symptoms suddenly get worse.  You have a very bad headache.  You have problems breathing or swallowing.  You have trouble opening your mouth.  You have puffiness (swelling) in your neck or around your eye.   This information is not intended to replace advice given to you by your health care provider. Make sure you discuss any questions you have with your health care provider.   Document  Released: 11/03/2014 Document Reviewed: 11/03/2014 Elsevier Interactive Patient Education 2016 [employer]  Dental Pain Dental pain may be caused by many things, including:  Tooth decay (cavities or caries). Cavities expose the nerve of your tooth to air and hot or cold temperatures. This can cause pain or discomfort.  Abscess or infection. A dental abscess is a collection of infected pus from a bacterial infection in the inner part of the tooth (pulp). It usually occurs at the end of the tooth's root.  Injury.  An unknown reason (idiopathic). Your pain may be mild or severe. It may only occur when:  You are chewing.  You are exposed to hot or cold temperature.  You are eating or drinking sugary foods or beverages, such as soda or candy. Your pain may also be constant. HOME CARE INSTRUCTIONS Watch your dental pain for any changes. The following actions may help to lessen any discomfort that you are feeling:  Take medicines only as directed by your dentist.  If you were prescribed an antibiotic medicine, finish all of it even if you  start to feel better.  Keep all follow-up visits as directed by your dentist. This is important.  Do not apply heat to the outside of your face.  Rinse your mouth or gargle with salt water if directed by your dentist. This helps with pain and swelling.  You can make salt water by adding  tsp of salt to 1 cup of warm water.  Apply ice to the painful area of your face:  Put ice in a plastic bag.  Place a towel between your skin and the bag.  Leave the ice on for 20 minutes, 2-3 times per day.  Avoid foods or drinks that cause you pain, such as:  Very hot or very cold foods or drinks.  Sweet or sugary foods or drinks. SEEK MEDICAL CARE IF:  Your pain is not controlled with medicines.  Your symptoms are worse.  You have new symptoms. SEEK IMMEDIATE MEDICAL CARE IF:  You are unable to open your mouth.  You are having trouble  breathing or swallowing.  You have a fever.  Your face, neck, or jaw is swollen.   This information is not intended to replace advice given to you by your health care provider. Make sure you discuss any questions you have with your health care provider.   Document Released: 06/19/2005 Document Revised: 11/03/2014 Document Reviewed: 06/15/2014 Elsevier Interactive Patient Education 2016 [employer].  Smoking Cessation, Tips for Success If you are ready to quit smoking, congratulations! You have chosen to help yourself be healthier. Cigarettes bring nicotine, tar, carbon monoxide, and other irritants into your body. Your lungs, heart, and blood vessels will be able to work better without these poisons. There are many different ways to quit smoking. Nicotine gum, nicotine patches, a nicotine inhaler, or nicotine nasal spray can help with physical craving. Hypnosis, support groups, and medicines help break the habit of smoking. WHAT THINGS CAN I DO TO MAKE QUITTING EASIER?  Here are some tips to help you quit for good:  Pick a date when you will quit smoking completely. Tell all of your friends and family about your plan to quit on that date.  Do not try to slowly cut down on the number of cigarettes you are smoking. Pick a quit date and quit smoking completely starting on that day.  Throw away all cigarettes.   Clean and remove all ashtrays from your home, work, and car.  On a card, write down your reasons for quitting. Carry the card with you and read it when you get the urge to smoke.  Cleanse your body of nicotine. Drink enough water and fluids to keep your urine clear or pale yellow. Do this after quitting to flush the nicotine from your body.  Learn to predict your moods. Do not let a bad situation be your excuse to have a cigarette. Some situations in your life might tempt you into wanting a cigarette.  Never have "just one" cigarette. It leads to wanting another and another.  Remind yourself of your decision to quit.  Change habits associated with smoking. If you smoked while driving or when feeling stressed, try other activities to replace smoking. Stand up when drinking your coffee. Brush your teeth after eating. Sit in a different chair when you read the paper. Avoid alcohol while trying to quit, and try to drink fewer caffeinated beverages. Alcohol and caffeine may urge you to smoke.  Avoid foods and drinks that can trigger a desire to smoke, such as sugary or spicy  foods and alcohol.  Ask people who smoke not to smoke around you.  Have something planned to do right after eating or having a cup of coffee. For example, plan to take a walk or exercise.  Try a relaxation exercise to calm you down and decrease your stress. Remember, you may be tense and nervous for the first 2 weeks after you quit, but this will pass.  Find new activities to keep your hands busy. Play with a pen, coin, or rubber band. Doodle or draw things on paper.  Brush your teeth right after eating. This will help cut down on the craving for the taste of tobacco after meals. You can also try mouthwash.   Use oral substitutes in place of cigarettes. Try using lemon drops, carrots, cinnamon sticks, or chewing gum. Keep them handy so they are available when you have the urge to smoke.  When you have the urge to smoke, try deep breathing.  Designate your home as a nonsmoking area.  If you are a heavy smoker, ask your health care provider about a prescription for nicotine chewing gum. It can ease your withdrawal from nicotine.  Reward yourself. Set aside the cigarette money you save and buy yourself something nice.  Look for support from others. Join a support group or smoking cessation program. Ask someone at home or at work to help you with your plan to quit smoking.  Always ask yourself, "Do I need this cigarette or is this just a reflex?" Tell yourself, "Today, I choose not to smoke," or  "I do not want to smoke." You are reminding yourself of your decision to quit.  Do not replace cigarette smoking with electronic cigarettes (commonly called e-cigarettes). The safety of e-cigarettes is unknown, and some may contain harmful chemicals.  If you relapse, do not give up! Plan ahead and think about what you will do the next time you get the urge to smoke. HOW WILL I FEEL WHEN I QUIT SMOKING? You may have symptoms of withdrawal because your body is used to nicotine (the addictive substance in cigarettes). You may crave cigarettes, be irritable, feel very hungry, cough often, get headaches, or have difficulty concentrating. The withdrawal symptoms are only temporary. They are strongest when you first quit but will go away within 10-14 days. When withdrawal symptoms occur, stay in control. Think about your reasons for quitting. Remind yourself that these are signs that your body is healing and getting used to being without cigarettes. Remember that withdrawal symptoms are easier to treat than the major diseases that smoking can cause.  Even after the withdrawal is over, expect periodic urges to smoke. However, these cravings are generally short lived and will go away whether you smoke or not. Do not smoke! WHAT RESOURCES ARE AVAILABLE TO HELP ME QUIT SMOKING? Your health care provider can direct you to community resources or hospitals for support, which may include:  Group support.  Education.  Hypnosis.  Therapy.   This information is not intended to replace advice given to you by your health care provider. Make sure you discuss any questions you have with your health care provider.   Document Released: 03/17/2004 Document Revised: 07/10/2014 Document Reviewed: 12/05/2012 Elsevier Interactive Patient Education [employer]

## 2015-07-26 ENCOUNTER — Encounter (HOSPITAL_COMMUNITY): Payer: Self-pay | Admitting: *Deleted

## 2015-07-26 ENCOUNTER — Inpatient Hospital Stay (HOSPITAL_COMMUNITY)
Admission: AD | Admit: 2015-07-26 | Discharge: 2015-07-26 | Disposition: A | Discharge status: Home / Self Care | Payer: Self-pay | Source: Ambulatory Visit | Attending: Family Medicine | Admitting: Family Medicine

## 2015-07-26 DIAGNOSIS — Z3A01 Less than 8 weeks gestation of pregnancy: Secondary | ICD-10-CM | POA: Insufficient documentation

## 2015-07-26 DIAGNOSIS — N926 Irregular menstruation, unspecified: Secondary | ICD-10-CM

## 2015-07-26 DIAGNOSIS — Z3201 Encounter for pregnancy test, result positive: Secondary | ICD-10-CM | POA: Insufficient documentation

## 2015-07-26 LAB — POCT PREGNANCY, URINE: Preg Test, Ur: POSITIVE — AB

## 2015-07-26 NOTE — Discharge Instructions (Signed)
First Trimester of Pregnancy The first trimester of pregnancy is from week 1 until the end of week 12 (months 1 through 3). A week after a sperm fertilizes an egg, the egg will implant on the wall of the uterus. This embryo will begin to develop into a baby. Genes from you and your partner are forming the baby. The female genes determine whether the baby is a boy or a girl. At 6-8 weeks, the eyes and face are formed, and the heartbeat can be seen on ultrasound. At the end of 12 weeks, all the baby's organs are formed.  Now that you are pregnant, you will want to do everything you can to have a healthy baby. Two of the most important things are to get good prenatal care and to follow your health care provider's instructions. Prenatal care is all the medical care you receive before the baby's birth. This care will help prevent, find, and treat any problems during the pregnancy and childbirth. BODY CHANGES Your body goes through many changes during pregnancy. The changes vary from woman to woman.   You may gain or lose a couple of pounds at first.  You may feel sick to your stomach (nauseous) and throw up (vomit). If the vomiting is uncontrollable, call your health care provider.  You may tire easily.  You may develop headaches that can be relieved by medicines approved by your health care provider.  You may urinate more often. Painful urination may mean you have a bladder infection.  You may develop heartburn as a result of your pregnancy.  You may develop constipation because certain hormones are causing the muscles that push waste through your intestines to slow down.  You may develop hemorrhoids or swollen, bulging veins (varicose veins).  Your breasts may begin to grow larger and become tender. Your nipples may stick out more, and the tissue that surrounds them (areola) may become darker.  Your gums may bleed and may be sensitive to brushing and flossing.  Dark spots or blotches (chloasma,  mask of pregnancy) may develop on your face. This will likely fade after the baby is born.  Your menstrual periods will stop.  You may have a loss of appetite.  You may develop cravings for certain kinds of food.  You may have changes in your emotions from day to day, such as being excited to be pregnant or being concerned that something may go wrong with the pregnancy and baby.  You may have more vivid and strange dreams.  You may have changes in your hair. These can include thickening of your hair, rapid growth, and changes in texture. Some women also have hair loss during or after pregnancy, or hair that feels dry or thin. Your hair will most likely return to normal after your baby is born. WHAT TO EXPECT AT YOUR PRENATAL VISITS During a routine prenatal visit:  You will be weighed to make sure you and the baby are growing normally.  Your blood pressure will be taken.  Your abdomen will be measured to track your baby's growth.  The fetal heartbeat will be listened to starting around week 10 or 12 of your pregnancy.  Test results from any previous visits will be discussed. Your health care provider may ask you:  How you are feeling.  If you are feeling the baby move.  If you have had any abnormal symptoms, such as leaking fluid, bleeding, severe headaches, or abdominal cramping.  If you are using any tobacco products,   including cigarettes, chewing tobacco, and electronic cigarettes.  If you have any questions. Other tests that may be performed during your first trimester include:  Blood tests to find your blood type and to check for the presence of any previous infections. They will also be used to check for low iron levels (anemia) and Rh antibodies. Later in the pregnancy, blood tests for diabetes will be done along with other tests if problems develop.  Urine tests to check for infections, diabetes, or protein in the urine.  An ultrasound to confirm the proper growth  and development of the baby.  An amniocentesis to check for possible genetic problems.  Fetal screens for spina bifida and Down syndrome.  You may need other tests to make sure you and the baby are doing well.  HIV (human immunodeficiency virus) testing. Routine prenatal testing includes screening for HIV, unless you choose not to have this test. HOME CARE INSTRUCTIONS  Medicines  Follow your health care provider's instructions regarding medicine use. Specific medicines may be either safe or unsafe to take during pregnancy.  Take your prenatal vitamins as directed.  If you develop constipation, try taking a stool softener if your health care provider approves. Diet  Eat regular, well-balanced meals. Choose a variety of foods, such as meat or vegetable-based protein, fish, milk and low-fat dairy products, vegetables, fruits, and whole grain breads and cereals. Your health care provider will help you determine the amount of weight gain that is right for you.  Avoid raw meat and uncooked cheese. These carry germs that can cause birth defects in the baby.  Eating four or five small meals rather than three large meals a day may help relieve nausea and vomiting. If you start to feel nauseous, eating a few soda crackers can be helpful. Drinking liquids between meals instead of during meals also seems to help nausea and vomiting.  If you develop constipation, eat more high-fiber foods, such as fresh vegetables or fruit and whole grains. Drink enough fluids to keep your urine clear or pale yellow. Activity and Exercise  Exercise only as directed by your health care provider. Exercising will help you:  Control your weight.  Stay in shape.  Be prepared for labor and delivery.  Experiencing pain or cramping in the lower abdomen or low back is a good sign that you should stop exercising. Check with your health care provider before continuing normal exercises.  Try to avoid standing for long  periods of time. Move your legs often if you must stand in one place for a long time.  Avoid heavy lifting.  Wear low-heeled shoes, and practice good posture.  You may continue to have sex unless your health care provider directs you otherwise. Relief of Pain or Discomfort  Wear a good support bra for breast tenderness.   Take warm sitz baths to soothe any pain or discomfort caused by hemorrhoids. Use hemorrhoid cream if your health care provider approves.   Rest with your legs elevated if you have leg cramps or low back pain.  If you develop varicose veins in your legs, wear support hose. Elevate your feet for 15 minutes, 3-4 times a day. Limit salt in your diet. Prenatal Care  Schedule your prenatal visits by the twelfth week of pregnancy. They are usually scheduled monthly at first, then more often in the last 2 months before delivery.  Write down your questions. Take them to your prenatal visits.  Keep all your prenatal visits as directed by your   health care provider. Safety  Wear your seat belt at all times when driving.  Make a list of emergency phone numbers, including numbers for family, friends, the hospital, and police and fire departments. General Tips  Ask your health care provider for a referral to a local prenatal education class. Begin classes no later than at the beginning of month 6 of your pregnancy.  Ask for help if you have counseling or nutritional needs during pregnancy. Your health care provider can offer advice or refer you to specialists for help with various needs.  Do not use hot tubs, steam rooms, or saunas.  Do not douche or use tampons or scented sanitary pads.  Do not cross your legs for long periods of time.  Avoid cat litter boxes and soil used by cats. These carry germs that can cause birth defects in the baby and possibly loss of the fetus by miscarriage or stillbirth.  Avoid all smoking, herbs, alcohol, and medicines not prescribed by  your health care provider. Chemicals in these affect the formation and growth of the baby.  Do not use any tobacco products, including cigarettes, chewing tobacco, and electronic cigarettes. If you need help quitting, ask your health care provider. You may receive counseling support and other resources to help you quit.  Schedule a dentist appointment. At home, brush your teeth with a soft toothbrush and be gentle when you floss. SEEK MEDICAL CARE IF:   You have dizziness.  You have mild pelvic cramps, pelvic pressure, or nagging pain in the abdominal area.  You have persistent nausea, vomiting, or diarrhea.  You have a bad smelling vaginal discharge.  You have pain with urination.  You notice increased swelling in your face, hands, legs, or ankles. SEEK IMMEDIATE MEDICAL CARE IF:   You have a fever.  You are leaking fluid from your vagina.  You have spotting or bleeding from your vagina.  You have severe abdominal cramping or pain.  You have rapid weight gain or loss.  You vomit blood or material that looks like coffee grounds.  You are exposed to German measles and have never had them.  You are exposed to fifth disease or chickenpox.  You develop a severe headache.  You have shortness of breath.  You have any kind of trauma, such as from a fall or a car accident.   This information is not intended to replace advice given to you by your health care provider. Make sure you discuss any questions you have with your health care provider.   Document Released: 06/13/2001 Document Revised: 07/10/2014 Document Reviewed: 04/29/2013 Elsevier Interactive Patient Education 2016 [employer]  

## 2015-07-26 NOTE — [facility] Provider Note (Signed)
S:  Joann King is a 25 y.o. [id number] at [redacted]w[redacted]d wks here for confirmation of pregnancy.  Patient's Patient's last menstrual period was 06/13/2015.Marland Kitchen  Denies any vaginal bleeding or abdominal pain.  Plans to get prenatal care at unknown.   O:  Past Medical History  Diagnosis Date  . Herpes genitalia     doesn't think ever had outbreak  . Anemia     during first pregnancy  . Anemia   . Heartburn in pregnancy     No family history on file.   Filed Vitals:   07/26/15 0951  BP: 131/79  Pulse: 72  Temp: 98.7 F (37.1 C)  Resp: 16   General:  A&OX3 with no signs of acute distress. She appears well-developed and well-nourished. No distress.  Neck: Normal range of motion.  Pulmonary/Chest: Effort normal. No respiratory distress.  Musculoskeletal: Normal range of motion.  Neurological: She is alert and oriented to person, place, and time.  Skin: Skin is warm and dry.   A: Positive Pregnancy Test Missed period   P: Explained benefits of taking prenatal vitamins w/folic acid early in pregnancy. Begin prenatal care. Reviewed warning signs of pregnancy.  Duane Lope, NP  07/26/2015 10:31 AM

## 2015-07-26 NOTE — [facility] Note (Signed)
Just wants a preg test.  Has not done a home test.

## 2015-08-01 ENCOUNTER — Inpatient Hospital Stay (HOSPITAL_COMMUNITY): Payer: Medicaid Other

## 2015-08-01 ENCOUNTER — Encounter (HOSPITAL_COMMUNITY): Payer: Self-pay | Admitting: *Deleted

## 2015-08-01 ENCOUNTER — Inpatient Hospital Stay (HOSPITAL_COMMUNITY)
Admission: AD | Admit: 2015-08-01 | Discharge: 2015-08-01 | Disposition: A | Discharge status: Home / Self Care | Payer: Self-pay | Source: Ambulatory Visit | Attending: Obstetrics & Gynecology | Admitting: Obstetrics & Gynecology

## 2015-08-01 DIAGNOSIS — O26899 Other specified pregnancy related conditions, unspecified trimester: Secondary | ICD-10-CM

## 2015-08-01 DIAGNOSIS — R1032 Left lower quadrant pain: Secondary | ICD-10-CM | POA: Insufficient documentation

## 2015-08-01 DIAGNOSIS — Z3A01 Less than 8 weeks gestation of pregnancy: Secondary | ICD-10-CM | POA: Insufficient documentation

## 2015-08-01 DIAGNOSIS — O9989 Other specified diseases and conditions complicating pregnancy, childbirth and the puerperium: Secondary | ICD-10-CM

## 2015-08-01 DIAGNOSIS — R109 Unspecified abdominal pain: Secondary | ICD-10-CM

## 2015-08-01 DIAGNOSIS — Z87891 Personal history of nicotine dependence: Secondary | ICD-10-CM | POA: Insufficient documentation

## 2015-08-01 DIAGNOSIS — O209 Hemorrhage in early pregnancy, unspecified: Secondary | ICD-10-CM

## 2015-08-01 LAB — WET PREP, GENITAL
Clue Cells Wet Prep HPF POC: NONE SEEN
Sperm: NONE SEEN
TRICH WET PREP: NONE SEEN

## 2015-08-01 LAB — URINE MICROSCOPIC-ADD ON

## 2015-08-01 LAB — CBC
HCT: 34.4 % — ABNORMAL LOW (ref 36.0–46.0)
Hemoglobin: 11.6 g/dL — ABNORMAL LOW (ref 12.0–15.0)
MCH: 30.2 pg (ref 26.0–34.0)
MCHC: 33.7 g/dL (ref 30.0–36.0)
MCV: 89.6 fL (ref 78.0–100.0)
Platelets: 341 10*3/uL (ref 150–400)
RBC: 3.84 MIL/uL — AB (ref 3.87–5.11)
RDW: 14.7 % (ref 11.5–15.5)
WBC: 6.6 10*3/uL (ref 4.0–10.5)

## 2015-08-01 LAB — URINALYSIS, ROUTINE W REFLEX MICROSCOPIC
Bilirubin Urine: NEGATIVE
GLUCOSE, UA: NEGATIVE mg/dL
Ketones, ur: NEGATIVE mg/dL
Leukocytes, UA: NEGATIVE
Nitrite: NEGATIVE
PROTEIN: NEGATIVE mg/dL
Specific Gravity, Urine: 1.02 (ref 1.005–1.030)
pH: 6.5 (ref 5.0–8.0)

## 2015-08-01 LAB — HCG, QUANTITATIVE, PREGNANCY: HCG, BETA CHAIN, QUANT, S: 2833 m[IU]/mL — AB (ref <5)

## 2015-08-01 MED ORDER — OXYCODONE-ACETAMINOPHEN 5-325 MG PO TABS: 1.0000 | ORAL_TABLET | Freq: Four times a day (QID) | ORAL | Status: DC | PRN

## 2015-08-01 MED ORDER — CONCEPT OB 130-92.4-1 MG PO CAPS: 1.0000 | ORAL_CAPSULE | Freq: Every day | ORAL | Status: DC

## 2015-08-01 NOTE — [facility] Note (Signed)
Lower belly cramping and bleeding noticed with wiping.  Noticed panties with large bight red blood stain

## 2015-08-01 NOTE — Discharge Instructions (Signed)
Threatened Miscarriage °A threatened miscarriage occurs when you have vaginal bleeding during your first 20 weeks of pregnancy but the pregnancy has not ended. If you have vaginal bleeding during this time, your health care provider will do tests to make sure you are still pregnant. If the tests show you are still pregnant and the developing baby (fetus) inside your womb (uterus) is still growing, your condition is considered a threatened miscarriage. °A threatened miscarriage does not mean your pregnancy will end, but it does increase the risk of losing your pregnancy (complete miscarriage). °CAUSES  °The cause of a threatened miscarriage is usually not known. If you go on to have a complete miscarriage, the most common cause is an abnormal number of chromosomes in the developing baby. Chromosomes are the structures inside cells that hold all your genetic material. °Some causes of vaginal bleeding that do not result in miscarriage include: °· Having sex. °· Having an infection. °· Normal hormone changes of pregnancy. °· Bleeding that occurs when an egg implants in your uterus. °RISK FACTORS °Risk factors for bleeding in early pregnancy include: °· Obesity. °· Smoking. °· Drinking excessive amounts of alcohol or caffeine. °· Recreational drug use. °SIGNS AND SYMPTOMS °· Light vaginal bleeding. °· Mild abdominal pain or cramps. °DIAGNOSIS  °If you have bleeding with or without abdominal pain before 20 weeks of pregnancy, your health care provider will do tests to check whether you are still pregnant. One important test involves using sound waves and a computer (ultrasound) to create images of the inside of your uterus. Other tests include an internal exam of your vagina and uterus (pelvic exam) and measurement of your baby's heart rate.  °You may be diagnosed with a threatened miscarriage if: °· Ultrasound testing shows you are still pregnant. °· Your baby's heart rate is strong. °· A pelvic exam shows that the  opening between your uterus and your vagina (cervix) is closed. °· Your heart rate and blood pressure are stable. °· Blood tests confirm you are still pregnant. °TREATMENT  °No treatments have been shown to prevent a threatened miscarriage from going on to a complete miscarriage. However, the right home care is important.  °HOME CARE INSTRUCTIONS  °· Make sure you keep all your appointments for prenatal care. This is very important. °· Get plenty of rest. °· Do not have sex or use tampons if you have vaginal bleeding. °· Do not douche. °· Do not smoke or use recreational drugs. °· Do not drink alcohol. °· Avoid caffeine. °SEEK MEDICAL CARE IF: °· You have light vaginal bleeding or spotting while pregnant. °· You have abdominal pain or cramping. °· You have a fever. °SEEK IMMEDIATE MEDICAL CARE IF: °· You have heavy vaginal bleeding. °· You have blood clots coming from your vagina. °· You have severe low back pain or abdominal cramps. °· You have fever, chills, and severe abdominal pain. °MAKE SURE YOU: °· Understand these instructions. °· Will watch your condition. °· Will get help right away if you are not doing well or get worse. °  °This information is not intended to replace advice given to you by your health care provider. Make sure you discuss any questions you have with your health care provider. °  °Document Released: 06/19/2005 Document Revised: 06/24/2013 Document Reviewed: 04/15/2013 °Elsevier Interactive Patient Education ©2016 [employer] °Abdominal Pain During Pregnancy °Abdominal pain is common in pregnancy. Most of the time, it does not cause harm. There are many causes of abdominal pain. Some causes   causes are more serious than others. Some of the causes of abdominal pain in pregnancy are easily diagnosed. Occasionally, the diagnosis takes time to understand. Other times, the cause is not determined. Abdominal pain can be a sign that something is very wrong with the pregnancy, or the pain may have  nothing to do with the pregnancy at all. For this reason, always tell your health care provider if you have any abdominal discomfort. HOME CARE INSTRUCTIONS  Monitor your abdominal pain for any changes. The following actions may help to alleviate any discomfort you are experiencing:  Do not have sexual intercourse or put anything in your vagina until your symptoms go away completely.  Get plenty of rest until your pain improves.  Drink clear fluids if you feel nauseous. Avoid solid food as long as you are uncomfortable or nauseous.  Only take over-the-counter or prescription medicine as directed by your health care provider.  Keep all follow-up appointments with your health care provider. SEEK IMMEDIATE MEDICAL CARE IF:  You are bleeding, leaking fluid, or passing tissue from the vagina.  You have increasing pain or cramping.  You have persistent vomiting.  You have painful or bloody urination.  You have a fever.  You notice a decrease in your baby's movements.  You have extreme weakness or feel faint.  You have shortness of breath, with or without abdominal pain.  You develop a severe headache with abdominal pain.  You have abnormal vaginal discharge with abdominal pain.  You have persistent diarrhea.  You have abdominal pain that continues even after rest, or gets worse. MAKE SURE YOU:   Understand these instructions.  Will watch your condition.  Will get help right away if you are not doing well or get worse.   This information is not intended to replace advice given to you by your health care provider. Make sure you discuss any questions you have with your health care provider.   Document Released: 06/19/2005 Document Revised: 04/09/2013 Document Reviewed: 01/16/2013 Else[employer] Interactive Patient Education [employer]

## 2015-08-01 NOTE — MAU Provider Note (Signed)
Chief Complaint: Vaginal Bleeding and Abdominal Cramping   First Provider Initiated Contact with Patient 08/01/15 1215     SUBJECTIVE HPI: Joann King is a 25 y.o. [id number] at [redacted]w[redacted]d by LMP who presents to [facility] reporting left lower quadrant pain and episode of bleeding as heavy as a period this morning  Location: LLQ Quality: cramping Severity: 8/10 on pain scale Duration: 2-3 days Course: improving Context: None Timing: Intermittent Modifying factors: Hasn't tried anything for the pain. Associated signs and symptoms: Positive for vaginal bleeding. Negative for fever, chills, vaginal discharge, passage of clots or tissue, urinary complaints or GI complaints.  Past Medical History  Diagnosis Date  . Herpes genitalia     doesn't think ever had outbreak  . Anemia     during first pregnancy  . Anemia   . Heartburn in pregnancy    OB History  Gravida Para Term Preterm AB SAB TAB Ectopic Multiple Living  # Outcome Date GA Lbr Len/2nd Weight Sex Delivery Anes PTL Lv  4 Current           3 Term 10/07/13 [redacted]w[redacted]d  6 lb 6.7 oz (2.91 kg) M CS-LTranv   Y  2 Term 10/12/07 [redacted]w[redacted]d  7 lb 9 oz (3.43 kg) M CS-LTranv EPI  Y     Comments: c/section for CPD  1 AB              Past Surgical History  Procedure Laterality Date  . Cesarean section  2009  . Cesarean section N/A 10/07/2013    Procedure: CESAREAN SECTION;  Surgeon: Lesly Dukes, MD;  Location: [facility];  Service: Obstetrics;  Laterality: N/A;   Social History   Social History  . Marital Status: Single    Spouse Name: N/A  . Number of Children: N/A  . Years of Education: N/A   Occupational History  . Not on file.   Social History Main Topics  . Smoking status: Former Smoker -- 0.50 packs/day    Types: Cigarettes  . Smokeless tobacco: Never Used  . Alcohol Use: Yes  . Drug Use: No  . Sexual Activity: Yes    Birth Control/ Protection: None     Comment: pregnant   Other Topics Concern  .  Not on file   Social History Narrative   No current facility-administered medications on file prior to encounter.   Current Outpatient Prescriptions on File Prior to Encounter  Medication Sig Dispense Refill  . doxycycline (VIBRAMYCIN) 100 MG capsule Take 1 capsule (100 mg total) by mouth 2 (two) times daily. One po bid x 7 days 14 capsule 0  . metroNIDAZOLE (FLAGYL) 500 MG tablet Take 1 tablet (500 mg total) by mouth 2 (two) times daily. (Patient not taking: Reported on 08/01/2015) 14 tablet 0  . promethazine (PHENERGAN) 12.5 MG tablet Take 1 tablet (12.5 mg total) by mouth every 6 (six) hours as needed for nausea or vomiting. (Patient not taking: Reported on 06/29/2015) 12 tablet 0   No Known Allergies  I have reviewed the past Medical Hx, Surgical Hx, Social Hx, Allergies and Medications.   Review of Systems  Constitutional: Negative for fever and chills.  Gastrointestinal: Positive for abdominal pain. Negative for nausea, vomiting, diarrhea and constipation.  Genitourinary: Positive for vaginal bleeding. Negative for dysuria, urgency, frequency and hematuria.    OBJECTIVE Patient Vitals for the past 24 hrs:  BP Temp Temp src Pulse  Resp Height Weight  08/01/15 1209 134/69 mmHg 98.5 F (36.9 C) Oral 88 18  (1.651 m) 190 lb (86.183 kg)   Constitutional: Well-developed, well-nourished female in mild distress.  Cardiovascular: normal rate Respiratory: normal rate and effort.  GI: Abd soft, moderate tenderness to palpation in left lower quadrant/groin area and suprapubic area. Positive guarding. No mass palpated. Pos BS x 4 MS: Extremities nontender, no edema, normal ROM Neurologic: Alert and oriented x 4.  GU: Neg CVAT.  SPECULUM EXAM: NEFG, physiologic discharge, small-moderate amount of brown blood noted, cervix clean  BIMANUAL: cervix closed; uterus normal size, no adnexal tenderness or masses. No CMT.  LAB RESULTS Results for orders placed or performed during the  hospital encounter of 08/01/15 (from the past 24 hour(s))  Urinalysis, Routine w reflex microscopic (not at [facility])     Status: Abnormal   Collection Time: 08/01/15 12:00 PM  Result Value Ref Range   Color, Urine YELLOW YELLOW   APPearance CLEAR CLEAR   Specific Gravity, Urine 1.020 1.005 - 1.030   pH 6.5 5.0 - 8.0   Glucose, UA NEGATIVE NEGATIVE mg/dL   Hgb urine dipstick LARGE (A) NEGATIVE   Bilirubin Urine NEGATIVE NEGATIVE   Ketones, ur NEGATIVE NEGATIVE mg/dL   Protein, ur NEGATIVE NEGATIVE mg/dL   Nitrite NEGATIVE NEGATIVE   Leukocytes, UA NEGATIVE NEGATIVE  Urine microscopic-add on     Status: Abnormal   Collection Time: 08/01/15 12:00 PM  Result Value Ref Range   Squamous Epithelial / LPF 0-5 (A) NONE SEEN   WBC, UA 0-5 0 - 5 WBC/hpf   RBC / HPF 6-30 0 - 5 RBC/hpf   Bacteria, UA FEW (A) NONE SEEN  hCG, quantitative, pregnancy     Status: Abnormal   Collection Time: 08/01/15 12:23 PM  Result Value Ref Range   hCG, Beta Chain, Quant, S 2833 (H) <5 mIU/mL  CBC     Status: Abnormal   Collection Time: 08/01/15 12:24 PM  Result Value Ref Range   WBC 6.6 4.0 - 10.5 K/uL   RBC 3.84 (L) 3.87 - 5.11 MIL/uL   Hemoglobin 11.6 (L) 12.0 - 15.0 g/dL   HCT 16.1 (L) 09.6 - 04.5 %   MCV 89.6 78.0 - 100.0 fL   MCH 30.2 26.0 - 34.0 pg   MCHC 33.7 30.0 - 36.0 g/dL   RDW 40.9 81.1 - 91.4 %   Platelets 341 150 - 400 K/uL  Wet prep, genital     Status: Abnormal   Collection Time: 08/01/15  1:40 PM  Result Value Ref Range   Yeast Wet Prep HPF POC PRESENT (A) NONE SEEN   Trich, Wet Prep NONE SEEN NONE SEEN   Clue Cells Wet Prep HPF POC NONE SEEN NONE SEEN   WBC, Wet Prep HPF POC FEW (A) NONE SEEN   Sperm NONE SEEN     IMAGING US Ob Comp Less 14 Wks  08/01/2015  CLINICAL DATA:  First trimester bleeding.  Abdominal pain. EXAM: OBSTETRIC <14 WK [place] AND TRANSVAGINAL OB US TECHNIQUE: Both transabdominal and transvaginal ultrasound examinations were performed for complete evaluation of the  gestation as well as the maternal uterus, adnexal regions, and pelvic cul-de-sac. Transvaginal technique was performed to assess early pregnancy. COMPARISON:  None. FINDINGS: Intrauterine gestational sac: Not visualized Yolk sac:  Not visualized Embryo:  Not visualized Subchorionic hemorrhage:  No subchorionic hemorrhage Maternal uterus/adnexae: No adnexal mass. Small amount of free fluid. IMPRESSION: No intrauterine pregnancy visualized. Differential considerations would include  early intrauterine pregnancy too early to visualize, spontaneous abortion, or occult ectopic pregnancy. Recommend close clinical followup and serial quantitative beta HCGs and ultrasounds. Electronically Signed   By: Charlett Nose M.D.   On: 08/01/2015 13:07   US Ob Transvaginal  08/01/2015  CLINICAL DATA:  First trimester bleeding.  Abdominal pain. EXAM: OBSTETRIC <14 WK [place] AND TRANSVAGINAL OB US TECHNIQUE: Both transabdominal and transvaginal ultrasound examinations were performed for complete evaluation of the gestation as well as the maternal uterus, adnexal regions, and pelvic cul-de-sac. Transvaginal technique was performed to assess early pregnancy. COMPARISON:  None. FINDINGS: Intrauterine gestational sac: Not visualized Yolk sac:  Not visualized Embryo:  Not visualized Subchorionic hemorrhage:  No subchorionic hemorrhage Maternal uterus/adnexae: No adnexal mass. Small amount of free fluid. IMPRESSION: No intrauterine pregnancy visualized. Differential considerations would include early intrauterine pregnancy too early to visualize, spontaneous abortion, or occult ectopic pregnancy. Recommend close clinical followup and serial quantitative beta HCGs and ultrasounds. Electronically Signed   By: Charlett Nose M.D.   On: 08/01/2015 13:07    MAU COURSE CBC, Quant, ABO/Rh, ultrasound, wet prep and GC/chlamydia culture, UA.  Discussed history, exam, labs, ultrasound Dr. Penne Lash. Dr. Penne Lash reviewed images with some are from  radiologist. Suboptimal images obtained due to overlying bowel gas. We'll repeat ultrasound in Quant in the morning due to patient's moderate discomfort and no evidence of IUP seen w/ quant above discriminatory zone.  MDM Pain and bleeding in early pregnancy with pregnancy of unknown anatomic location, but hemodynamically stable. No evidence of IUP seen w/ quant above discriminatory zone  ASSESSMENT 1. First trimester bleeding   2. Abdominal pain affecting pregnancy, antepartum     PLAN Discharge home in stable condition per consult with Dr. Penne Lash. Ectopic and SAB precautions     Follow-up Information    Follow up with THE [facility] OF [place] ULTRASOUND On 08/02/2015.   Specialty:  Radiology   Why:  repeat ultrasound and lab work   Contact information:   [address] [id number] mc [place] [address] [phone]      Follow up with THE [facility] OF [place] MATERNITY ADMISSIONS.   Why:  As needed in emergencies   Contact information:   [address] [id number] mc [place] [address] [phone]      Follow up with [facility] On 08/02/2015.   Specialty:  Obstetrics and Gynecology   Why:  To review results of ultrasound and lab work   Contact information:   [address] [place] [address] [phone]       Medication List    STOP taking these medications        doxycycline 100 MG capsule  Commonly known as:  VIBRAMYCIN     metroNIDAZOLE 500 MG tablet  Commonly known as:  FLAGYL      TAKE these medications        CONCEPT OB 130-92.4-1 MG Caps  Take 1 tablet by mouth daily.     oxyCODONE-acetaminophen 5-325 MG tablet  Commonly known as:  PERCOCET/ROXICET  Take 1-2 tablets by mouth every 6 (six) hours as needed for severe pain.     promethazine 12.5 MG tablet  Commonly known as:  PHENERGAN  Take 1 tablet (12.5 mg total) by mouth every 6 (six) hours as needed  for nausea or vomiting.         Dorathy Kinsman, CNM 08/01/2015  2:25 PM  Pt seen and examined.  Pt having gas  for 3 days.  Nml bowel movements.  Pt having some pain on the left side.  Pt more tender on the abdominal portion of the exam than the bimanual.  Spoke with Alyson Reedy at [place].  There was a lot of obscuring bowel gas which made the uterus difficult to visualize.  The adnexa were seen with confidence.    Give beta hcg level, would expect to see a gestational sac.  However, the bowel gas can make this difficult with shadowing.  Pt also had bleeding the morning and could of passed fetal tissue.  Cervix is closed now.   Ectopic precautions given.   Return to MAU tomorrow morning.  NPO after 2 am to help with bowel gas.   Pt to return earlier if pain worsens.  Elsie Lincoln MD

## 2015-08-02 ENCOUNTER — Observation Stay (HOSPITAL_COMMUNITY)
Admission: AD | Admit: 2015-08-02 | Discharge: 2015-08-02 | Disposition: A | Discharge status: Home / Self Care | Payer: Self-pay | Source: Ambulatory Visit | Attending: Family Medicine | Admitting: Family Medicine

## 2015-08-02 ENCOUNTER — Encounter (HOSPITAL_COMMUNITY)
Admission: AD | Disposition: A | Discharge status: Home / Self Care | Payer: Self-pay | Source: Ambulatory Visit | Attending: Family Medicine

## 2015-08-02 ENCOUNTER — Inpatient Hospital Stay (HOSPITAL_COMMUNITY): Payer: Self-pay

## 2015-08-02 ENCOUNTER — Encounter (HOSPITAL_COMMUNITY): Payer: Self-pay

## 2015-08-02 ENCOUNTER — Inpatient Hospital Stay (HOSPITAL_COMMUNITY): Payer: Self-pay | Admitting: Anesthesiology

## 2015-08-02 ENCOUNTER — Inpatient Hospital Stay (HOSPITAL_COMMUNITY): Payer: Medicaid Other | Admitting: Anesthesiology

## 2015-08-02 ENCOUNTER — Other Ambulatory Visit: Payer: Medicaid Other

## 2015-08-02 DIAGNOSIS — R102 Pelvic and perineal pain: Secondary | ICD-10-CM

## 2015-08-02 DIAGNOSIS — O00109 Unspecified tubal pregnancy without intrauterine pregnancy: Secondary | ICD-10-CM | POA: Insufficient documentation

## 2015-08-02 DIAGNOSIS — Z87891 Personal history of nicotine dependence: Secondary | ICD-10-CM | POA: Insufficient documentation

## 2015-08-02 DIAGNOSIS — O001 Tubal pregnancy without intrauterine pregnancy: Principal | ICD-10-CM | POA: Insufficient documentation

## 2015-08-02 HISTORY — PX: DIAGNOSTIC LAPAROSCOPY WITH REMOVAL OF ECTOPIC PREGNANCY: SHX6449

## 2015-08-02 LAB — CBC
HCT: 34.6 % — ABNORMAL LOW (ref 36.0–46.0)
Hemoglobin: 11.7 g/dL — ABNORMAL LOW (ref 12.0–15.0)
MCH: 30.5 pg (ref 26.0–34.0)
MCHC: 33.8 g/dL (ref 30.0–36.0)
MCV: 90.3 fL (ref 78.0–100.0)
PLATELETS: 364 10*3/uL (ref 150–400)
RBC: 3.83 MIL/uL — AB (ref 3.87–5.11)
RDW: 14.6 % (ref 11.5–15.5)
WBC: 9.6 10*3/uL (ref 4.0–10.5)

## 2015-08-02 LAB — TYPE AND SCREEN
ABO/RH(D): O POS
ANTIBODY SCREEN: NEGATIVE

## 2015-08-02 LAB — HIV ANTIBODY (ROUTINE TESTING W REFLEX): HIV Screen 4th Generation wRfx: NONREACTIVE

## 2015-08-02 LAB — GC/CHLAMYDIA PROBE AMP (~~LOC~~) NOT AT ARMC
CHLAMYDIA, DNA PROBE: NEGATIVE
NEISSERIA GONORRHEA: NEGATIVE

## 2015-08-02 LAB — HCG, QUANTITATIVE, PREGNANCY: HCG, BETA CHAIN, QUANT, S: 1947 m[IU]/mL — AB (ref <5)

## 2015-08-02 SURGERY — LAPAROSCOPY, WITH ECTOPIC PREGNANCY SURGICAL TREATMENT
Anesthesia: General | Site: Abdomen | Laterality: Left

## 2015-08-02 MED ORDER — PROPOFOL 10 MG/ML IV BOLUS
INTRAVENOUS | Status: DC | PRN
  Administered 2015-08-02: 200 mg via INTRAVENOUS

## 2015-08-02 MED ORDER — HYDROMORPHONE HCL 2 MG/ML IJ SOLN
2.0000 mg | Freq: Once | INTRAMUSCULAR | Status: AC
  Administered 2015-08-02: 2 mg via INTRAVENOUS
  Filled 2015-08-02: qty 1

## 2015-08-02 MED ORDER — PROPOFOL 10 MG/ML IV BOLUS
INTRAVENOUS | Status: AC
  Filled 2015-08-02: qty 20

## 2015-08-02 MED ORDER — ROCURONIUM BROMIDE 100 MG/10ML IV SOLN
INTRAVENOUS | Status: AC
  Filled 2015-08-02: qty 1

## 2015-08-02 MED ORDER — SUCCINYLCHOLINE CHLORIDE 20 MG/ML IJ SOLN
INTRAMUSCULAR | Status: AC
  Filled 2015-08-02: qty 1

## 2015-08-02 MED ORDER — DEXAMETHASONE SODIUM PHOSPHATE 10 MG/ML IJ SOLN
INTRAMUSCULAR | Status: AC
  Filled 2015-08-02: qty 1

## 2015-08-02 MED ORDER — GLYCOPYRROLATE 0.2 MG/ML IJ SOLN
INTRAMUSCULAR | Status: DC | PRN
  Administered 2015-08-02: 0.6 mg via INTRAVENOUS

## 2015-08-02 MED ORDER — HYDROMORPHONE HCL 1 MG/ML IJ SOLN
0.2500 mg | INTRAMUSCULAR | Status: DC | PRN
  Administered 2015-08-02 (×2): 0.25 mg via INTRAVENOUS

## 2015-08-02 MED ORDER — LACTATED RINGERS IV SOLN
INTRAVENOUS | Status: DC
  Administered 2015-08-02 (×3): via INTRAVENOUS

## 2015-08-02 MED ORDER — GLYCOPYRROLATE 0.2 MG/ML IJ SOLN
INTRAMUSCULAR | Status: AC
  Filled 2015-08-02: qty 3

## 2015-08-02 MED ORDER — MEPERIDINE HCL 25 MG/ML IJ SOLN: 6.2500 mg | INTRAMUSCULAR | Status: DC | PRN

## 2015-08-02 MED ORDER — ONDANSETRON HCL 4 MG/2ML IJ SOLN
INTRAMUSCULAR | Status: AC
  Filled 2015-08-02: qty 2

## 2015-08-02 MED ORDER — ONDANSETRON HCL 4 MG/2ML IJ SOLN
INTRAMUSCULAR | Status: DC | PRN
  Administered 2015-08-02: 4 mg via INTRAVENOUS

## 2015-08-02 MED ORDER — NEOSTIGMINE METHYLSULFATE 10 MG/10ML IV SOLN
INTRAVENOUS | Status: AC
  Filled 2015-08-02: qty 1

## 2015-08-02 MED ORDER — IBUPROFEN 600 MG PO TABS: 600.0000 mg | ORAL_TABLET | Freq: Four times a day (QID) | ORAL | Status: DC | PRN

## 2015-08-02 MED ORDER — BUPIVACAINE HCL (PF) 0.25 % IJ SOLN
INTRAMUSCULAR | Status: DC | PRN
  Administered 2015-08-02: 10 mL

## 2015-08-02 MED ORDER — DEXAMETHASONE SODIUM PHOSPHATE 10 MG/ML IJ SOLN
INTRAMUSCULAR | Status: DC | PRN
  Administered 2015-08-02: 10 mg via INTRAVENOUS

## 2015-08-02 MED ORDER — MIDAZOLAM HCL 2 MG/2ML IJ SOLN
INTRAMUSCULAR | Status: DC | PRN
  Administered 2015-08-02 (×2): 1 mg via INTRAVENOUS

## 2015-08-02 MED ORDER — MIDAZOLAM HCL 2 MG/2ML IJ SOLN
INTRAMUSCULAR | Status: AC
  Filled 2015-08-02: qty 2

## 2015-08-02 MED ORDER — ROCURONIUM BROMIDE 100 MG/10ML IV SOLN
INTRAVENOUS | Status: DC | PRN
  Administered 2015-08-02: 30 mg via INTRAVENOUS

## 2015-08-02 MED ORDER — SCOPOLAMINE 1 MG/3DAYS TD PT72
MEDICATED_PATCH | TRANSDERMAL | Status: DC | PRN
  Administered 2015-08-02: 1 via TRANSDERMAL

## 2015-08-02 MED ORDER — LACTATED RINGERS IR SOLN
Status: DC | PRN
  Administered 2015-08-02: 3000 mL

## 2015-08-02 MED ORDER — FENTANYL CITRATE (PF) 100 MCG/2ML IJ SOLN
INTRAMUSCULAR | Status: AC
  Filled 2015-08-02: qty 2

## 2015-08-02 MED ORDER — LIDOCAINE HCL (CARDIAC) 20 MG/ML IV SOLN
INTRAVENOUS | Status: DC | PRN
  Administered 2015-08-02: 100 mg via INTRAVENOUS

## 2015-08-02 MED ORDER — LIDOCAINE HCL (CARDIAC) 20 MG/ML IV SOLN
INTRAVENOUS | Status: AC
  Filled 2015-08-02: qty 5

## 2015-08-02 MED ORDER — FENTANYL CITRATE (PF) 100 MCG/2ML IJ SOLN
INTRAMUSCULAR | Status: DC | PRN
  Administered 2015-08-02 (×2): 50 ug via INTRAVENOUS

## 2015-08-02 MED ORDER — SCOPOLAMINE 1 MG/3DAYS TD PT72
MEDICATED_PATCH | TRANSDERMAL | Status: AC
  Filled 2015-08-02: qty 1

## 2015-08-02 MED ORDER — HYDROMORPHONE HCL 1 MG/ML IJ SOLN
INTRAMUSCULAR | Status: AC
  Administered 2015-08-02: 0.25 mg via INTRAVENOUS
  Filled 2015-08-02: qty 1

## 2015-08-02 MED ORDER — DOCUSATE SODIUM 100 MG PO CAPS: 100.0000 mg | ORAL_CAPSULE | Freq: Two times a day (BID) | ORAL | Status: DC | PRN

## 2015-08-02 MED ORDER — CITRIC ACID-SODIUM CITRATE 334-500 MG/5ML PO SOLN
30.0000 mL | Freq: Once | ORAL | Status: AC
  Administered 2015-08-02: 30 mL via ORAL
  Filled 2015-08-02: qty 15

## 2015-08-02 MED ORDER — OXYCODONE-ACETAMINOPHEN 5-325 MG PO TABS: 1.0000 | ORAL_TABLET | ORAL | Status: DC | PRN

## 2015-08-02 MED ORDER — NEOSTIGMINE METHYLSULFATE 10 MG/10ML IV SOLN
INTRAVENOUS | Status: DC | PRN
  Administered 2015-08-02: 4 mg via INTRAVENOUS

## 2015-08-02 MED ORDER — SUCCINYLCHOLINE CHLORIDE 20 MG/ML IJ SOLN
INTRAMUSCULAR | Status: DC | PRN
  Administered 2015-08-02: 140 mg via INTRAVENOUS

## 2015-08-02 MED ORDER — FAMOTIDINE IN NACL 20-0.9 MG/50ML-% IV SOLN
20.0000 mg | Freq: Once | INTRAVENOUS | Status: AC
  Administered 2015-08-02: 20 mg via INTRAVENOUS
  Filled 2015-08-02: qty 50

## 2015-08-02 MED ORDER — LACTATED RINGERS IV SOLN: INTRAVENOUS | Status: DC

## 2015-08-02 MED ORDER — PROMETHAZINE HCL 25 MG/ML IJ SOLN: 6.2500 mg | INTRAMUSCULAR | Status: DC | PRN

## 2015-08-02 SURGICAL SUPPLY — 27 items
BAG SPEC RTRVL LRG 6X4 10 (ENDOMECHANICALS) ×1
CHLORAPREP W/TINT 26ML (MISCELLANEOUS) ×2 IMPLANT
DRSG COVADERM PLUS 2X2 (GAUZE/BANDAGES/DRESSINGS) ×4 IMPLANT
DRSG OPSITE POSTOP 3X4 (GAUZE/BANDAGES/DRESSINGS) ×1 IMPLANT
ELECT REM PT RETURN 9FT ADLT (ELECTROSURGICAL) ×2
ELECTRODE REM PT RTRN 9FT ADLT (ELECTROSURGICAL) IMPLANT
GLOVE BIOGEL PI IND STRL 6.5 (GLOVE) ×1 IMPLANT
GLOVE BIOGEL PI IND STRL 7.0 (GLOVE) ×1 IMPLANT
GLOVE BIOGEL PI INDICATOR 6.5 (GLOVE) ×1
GLOVE BIOGEL PI INDICATOR 7.0 (GLOVE) ×1
GLOVE SURG SS PI 6.0 STRL IVOR (GLOVE) ×2 IMPLANT
GOWN STRL REUS W/TWL LRG LVL3 (GOWN DISPOSABLE) ×4 IMPLANT
LIQUID BAND (GAUZE/BANDAGES/DRESSINGS) ×2 IMPLANT
NS IRRIG 1000ML POUR BTL (IV SOLUTION) ×2 IMPLANT
PACK LAPAROSCOPY BASIN (CUSTOM PROCEDURE TRAY) ×2 IMPLANT
PAD TRENDELENBURG POSITION (MISCELLANEOUS) ×2 IMPLANT
POUCH SPECIMEN RETRIEVAL 10MM (ENDOMECHANICALS) ×1 IMPLANT
SET IRRIG TUBING LAPAROSCOPIC (IRRIGATION / IRRIGATOR) ×1 IMPLANT
SHEARS HARMONIC ACE PLUS 36CM (ENDOMECHANICALS) ×1 IMPLANT
SLEEVE XCEL OPT CAN 5 100 (ENDOMECHANICALS) ×2 IMPLANT
SUT MNCRL AB 4-0 PS2 18 (SUTURE) ×2 IMPLANT
SUT VICRYL 0 UR6 27IN ABS (SUTURE) ×4 IMPLANT
TOWEL OR 17X24 6PK STRL BLUE (TOWEL DISPOSABLE) ×4 IMPLANT
TRAY FOLEY CATH SILVER 14FR (SET/KITS/TRAYS/PACK) ×2 IMPLANT
TROCAR BALLN 12MMX100 BLUNT (TROCAR) ×2 IMPLANT
TROCAR XCEL NON-BLD 5MMX100MML (ENDOMECHANICALS) ×2 IMPLANT
WATER STERILE IRR 1000ML POUR (IV SOLUTION) ×2 IMPLANT

## 2015-08-02 NOTE — Anesthesia Procedure Notes (Signed)
Procedure Name: Intubation Date/Time: 08/02/2015 11:40 AM Performed by: Collier Flowers Pre-anesthesia Checklist: Patient identified, Emergency Drugs available, Suction available, Patient being monitored and Timeout performed Patient Re-evaluated:Patient Re-evaluated prior to inductionOxygen Delivery Method: Circle system utilized Preoxygenation: Pre-oxygenation with 100% oxygen Intubation Type: IV induction, Rapid sequence and Cricoid Pressure applied Laryngoscope Size: Mac and 3 Grade View: Grade I Tube type: Oral Tube size: 7.0 mm Number of attempts: 1 Airway Equipment and Method: Stylet Secured at: 21 (at lips) cm Tube secured with: Tape Dental Injury: Teeth and Oropharynx as per pre-operative assessment

## 2015-08-02 NOTE — OR Nursing (Signed)
Called MAU to send for patient @ 1105

## 2015-08-02 NOTE — MAU Note (Signed)
Urine in lab 

## 2015-08-02 NOTE — Op Note (Signed)
Joann King PROCEDURE DATE: 08/02/2015  PREOPERATIVE DIAGNOSIS: Ruptured ectopic pregnancy POSTOPERATIVE DIAGNOSIS: Ruptured left fallopian tube ectopic pregnancy PROCEDURE: Laparoscopic left salpingectomy and removal of ectopic pregnancy SURGEON:  Dr. Catalina King ASSISTANT: Dr. Candelaria King ANESTHESIOLOGIST: Joann Silvas, MD Anesthesiologist: Joann Silvas, MD CRNA: Joann Pollen, CRNA; Joann Flowers, CRNA  INDICATIONS: 25 y.o. [phone] at [redacted]w[redacted]d here for with ruptured ectopic pregnancy. On exam, she had stable vital signs, and an acute abdomen. Hgb  Lab Results  Component Value Date   HGB 11.7* 08/02/2015   , blood type O POS . Patient was counseled regarding need for laparoscopic salpingectomy. Risks of surgery including bleeding which may require transfusion or reoperation, infection, injury to bowel or other surrounding organs, need for additional procedures including laparotomy and other postoperative/anesthesia complications were explained to patient.  Written informed consent was obtained.  FINDINGS:  Moderate amount of hemoperitoneum estimated to be about 100 cc of blood and clots.  Dilated left fallopian tube containing ectopic gestation. Small normal appearing uterus, normal right fallopian tube, right ovary and right ovary.  ANESTHESIA: General INTRAVENOUS FLUIDS: .1500 ml ESTIMATED BLOOD LOSS:  100 ml URINE OUTPUT: 200 ml SPECIMENS: left fallopian tube containing ectopic gestation COMPLICATIONS: None immediate  PROCEDURE IN DETAIL:  The patient was taken to the operating room where general anesthesia was administered and was found to be adequate.  She was placed in the dorsal lithotomy position, and was prepped and draped in a sterile manner.  A Foley catheter was inserted into her bladder and attached to Joann King drainage and a uterine manipulator was then advanced into the uterus .  After an adequate timeout was performed, attention was then turned to the  patient's abdomen where a 10-mm skin incision was made on the umbilical fold.  The fascia was identified, tented up and incised with Mayo scissors. The fascia was tagged with 0- Vicryl. The peritoneum was identified tented up and entered sharply with Metzenbaum scissors.  10-mm trocar and sleeve were then advanced without difficulty into the abdomen where intraabdominal placement was confirmed by the laparoscope. Pneumoperitoneum was achieved with insufflation of CO2 gas. A survey of the patient's pelvis and abdomen revealed the findings as above.  Bilateral lower quadrant ports were placed under direct visualization; 5-mm on the left and 5-mm on the right.  The 5-mm Joann King suction irrigator was then used to suction the hemoperitoneum and irrigate the pelvis.  Attention was then turned to the left fallopian tube which was grasped and ligated from the underlying mesosalpinx and uterine attachment using the Enseal instrument.  Good hemostasis was noted.  The specimen was placed in an EndoCatch bag and removed from the abdomen intact.  The abdomen was desufflated, and all instruments were removed.  The fascial incisions of both 10-mm sites were reapproximated with 0 Vicryl figure-of-eight stiches; and all skin incisions were closed with a 3-0 Vicryl subcuticular stitch. The patient tolerated the procedures well.  All instruments, needles, and sponge counts were correct x 2. The patient was taken to the recovery room in stable condition.   The patient will be discharged to home as per PACU criteria.  Routine postoperative instructions given.  She was prescribed Percocet, Ibuprofen and Colace.  She will follow up in the clinic in 2 weeks for postoperative evaluation .

## 2015-08-02 NOTE — Anesthesia Postprocedure Evaluation (Signed)
Anesthesia Post Note  Patient: Joann King  Procedure(s) Performed: Procedure(s) (LRB): DIAGNOSTIC LAPAROSCOPY WITH REMOVAL OF ECTOPIC PREGNANCY (Left)  Patient location during evaluation: PACU Anesthesia Type: General Level of consciousness: awake and alert Pain management: pain level controlled Vital Signs Assessment: post-procedure vital signs reviewed and stable Respiratory status: spontaneous breathing, nonlabored ventilation, respiratory function stable and patient connected to nasal cannula oxygen Cardiovascular status: blood pressure returned to baseline and stable Postop Assessment: no signs of nausea or vomiting Anesthetic complications: no    Last Vitals:  Filed Vitals:   08/02/15 1325 08/02/15 1330  BP:  132/71  Pulse: 77 75  Temp:    Resp: 19 17    Last Pain:  Filed Vitals:   08/02/15 1333  PainSc: 2                  Shelton Silvas

## 2015-08-02 NOTE — Anesthesia Postprocedure Evaluation (Incomplete)
Anesthesia Post Note  Patient: Joann King  Procedure(s) Performed: Procedure(s) (LRB): DIAGNOSTIC LAPAROSCOPY WITH REMOVAL OF ECTOPIC PREGNANCY (Left)  Anesthesia Post Evaluation  Last Vitals:  Filed Vitals:   08/02/15 0715 08/02/15 1300  BP: 111/64 115/68  Pulse: 81 93  Temp: 36.7 C 36.8 C  Resp: 18 16    Last Pain:  Filed Vitals:   08/02/15 1305  PainSc: 5                  Elie Leppo

## 2015-08-02 NOTE — MAU Provider Note (Signed)
History   [id number]   Chief Complaint  Patient presents with  . Vaginal Bleeding  . Abdominal Cramping    HPI Joann King is a 25 y.o. female  [phone] at [redacted]w[redacted]d IUP here with report of increased pelvic pain.  Seen in MAU yesterday for bleeding and pain.  BHCG was 2833; ultrasound could not identify location of pregnancy.  No report of abnormal adnexal mass.  Pt was instructed to return this morning for repeat BHCG and ultrasound.  Pt arrived via EMS due to increased pain.  Bleeding has remained the same per patient.  Pain is located on left lower pelvis.      Patient's last menstrual period was 06/13/2015.  OB History  Gravida Para Term Preterm AB SAB TAB Ectopic Multiple Living  # Outcome Date GA Lbr Len/2nd Weight Sex Delivery Anes PTL Lv  4 Current           3 Term 10/07/13 [redacted]w[redacted]d  2.91 kg (6 lb 6.7 oz) M CS-LTranv   Y  2 Term 10/12/07 [redacted]w[redacted]d  3.43 kg (7 lb 9 oz) M CS-LTranv EPI  Y     Comments: c/section for CPD  1 AB               Past Medical History  Diagnosis Date  . Herpes genitalia     doesn't think ever had outbreak  . Anemia     during first pregnancy  . Anemia   . Heartburn in pregnancy     Family History  Problem Relation Age of Onset  . Diabetes Maternal Grandmother   . Hypertension Maternal Grandmother     Social History   Social History  . Marital Status: Single    Spouse Name: N/A  . Number of Children: N/A  . Years of Education: N/A   Social History Main Topics  . Smoking status: Former Smoker -- 0.50 packs/day    Types: Cigarettes  . Smokeless tobacco: Never Used  . Alcohol Use: Yes  . Drug Use: No  . Sexual Activity: Yes    Birth Control/ Protection: None     Comment: pregnant   Other Topics Concern  . None   Social History Narrative    No Known Allergies  No current facility-administered medications on file prior to encounter.   Current Outpatient Prescriptions on File Prior to Encounter  Medication Sig  Dispense Refill  . oxyCODONE-acetaminophen (PERCOCET/ROXICET) 5-325 MG tablet Take 1-2 tablets by mouth every 6 (six) hours as needed for severe pain. 15 tablet 0  . Prenat w/o A Vit-FeFum-FePo-FA (CONCEPT OB) 130-92.4-1 MG CAPS Take 1 tablet by mouth daily. 30 capsule 12  . promethazine (PHENERGAN) 12.5 MG tablet Take 1 tablet (12.5 mg total) by mouth every 6 (six) hours as needed for nausea or vomiting. (Patient not taking: Reported on 06/29/2015) 12 tablet 0   US Ob Comp Less 14 Wks  08/01/2015  CLINICAL DATA:  First trimester bleeding.  Abdominal pain. EXAM: OBSTETRIC <14 WK [place] AND TRANSVAGINAL OB US TECHNIQUE: Both transabdominal and transvaginal ultrasound examinations were performed for complete evaluation of the gestation as well as the maternal uterus, adnexal regions, and pelvic cul-de-sac. Transvaginal technique was performed to assess early pregnancy. COMPARISON:  None. FINDINGS: Intrauterine gestational sac: Not visualized Yolk sac:  Not visualized Embryo:  Not visualized Subchorionic hemorrhage:  No subchorionic hemorrhage Maternal uterus/adnexae: No adnexal mass. Small amount of free fluid. IMPRESSION:  No intrauterine pregnancy visualized. Differential considerations would include early intrauterine pregnancy too early to visualize, spontaneous abortion, or occult ectopic pregnancy. Recommend close clinical followup and serial quantitative beta HCGs and ultrasounds. Electronically Signed   By: Charlett Nose M.D.   On: 08/01/2015 13:07   US Ob Transvaginal  08/02/2015  CLINICAL DATA:  Vaginal bleeding during pregnancy. Quantitative beta HCG today is 1,947. Beta HCG yesterday was 2,833. By LMP patient is 7 weeks 1 day. EDC by LMP is 03/19/2016. LMP was 06/12/2016. EXAM: TRANSVAGINAL OB ULTRASOUND TECHNIQUE: Transvaginal ultrasound was performed for complete evaluation of the gestation as well as the maternal uterus, adnexal regions, and pelvic cul-de-sac. COMPARISON:  07/28/2015 FINDINGS:  Intrauterine gestational sac: Small irregular collections of fluid is identified in the endometrial canal without definite gestational sac. Yolk sac:  None Embryo:  None Cardiac Activity: None Heart Rate: Absent bpm Subchorionic hemorrhage:  None Maternal uterus/adnexae: Right ovary is not visualized. Left ovary has a normal appearance. Adjacent to the left ovary there is a soft tissue mass measuring 2.7 x 3.0 x 2.2 cm. Mass is avascular on Doppler evaluation. No definite gestational sac identified within this mass. There is a small amount of left and right adnexal fluid. IMPRESSION: 1. Interval development of small amount of fluid in the endometrial canal, favored to represent a pseudo gestational sac. 2. Solid mass the left adnexal suspicious for left ectopic pregnancy measuring 3.0 cm. 3. Small amount of free pelvic fluid. Critical Value/emergent results were called by telephone at the time of interpretation on 08/02/2015 at 10:00 am to provider Victorino Dike in MAU in who verbally acknowledged these results. Electronically Signed   By: Norva Pavlov M.D.   On: 08/02/2015 10:04   US Ob Transvaginal  08/01/2015  CLINICAL DATA:  First trimester bleeding.  Abdominal pain. EXAM: OBSTETRIC <14 WK [place] AND TRANSVAGINAL OB US TECHNIQUE: Both transabdominal and transvaginal ultrasound examinations were performed for complete evaluation of the gestation as well as the maternal uterus, adnexal regions, and pelvic cul-de-sac. Transvaginal technique was performed to assess early pregnancy. COMPARISON:  None. FINDINGS: Intrauterine gestational sac: Not visualized Yolk sac:  Not visualized Embryo:  Not visualized Subchorionic hemorrhage:  No subchorionic hemorrhage Maternal uterus/adnexae: No adnexal mass. Small amount of free fluid. IMPRESSION: No intrauterine pregnancy visualized. Differential considerations would include early intrauterine pregnancy too early to visualize, spontaneous abortion, or occult ectopic pregnancy.  Recommend close clinical followup and serial quantitative beta HCGs and ultrasounds. Electronically Signed   By: Charlett Nose M.D.   On: 08/01/2015 13:07     Review of Systems  Gastrointestinal: Negative for nausea and vomiting.  Genitourinary: Positive for vaginal bleeding and pelvic pain.  All other systems reviewed and are negative.    Physical Exam   Filed Vitals:   08/02/15 0715  BP: 111/64  Pulse: 81  Temp: 98 F (36.7 C)  TempSrc: Oral  Resp: 18  Height:  (1.651 m)  Weight: 86.183 kg (190 lb)    Physical Exam  Constitutional: She is oriented to person, place, and time. She appears well-developed and well-nourished.  HENT:  Head: Normocephalic.  Neck: Normal range of motion. Neck supple.  Cardiovascular: Normal rate, regular rhythm and normal heart sounds.   Respiratory: Effort normal and breath sounds normal. No respiratory distress.  GI: Soft. There is no tenderness.  Genitourinary: Uterus is not enlarged. Cervix exhibits no motion tenderness. Right adnexum displays no mass, no tenderness and no fullness. Left adnexum displays tenderness. Left adnexum displays  no fullness. There is bleeding (small amount of dark red blood) in the vagina.  Musculoskeletal: Normal range of motion. She exhibits no edema.  Neurological: She is alert and oriented to person, place, and time.  Skin: Skin is warm and dry.    MAU Course  Procedures IV LR 1000cc IV Dilaudid 2 mg   MDM 0735 Dr. Penne Lash notified regarding patient's arrival 0800 Report given to [facility] who assumes care of patient.  Marlis Edelson, CNM 08/02/2015 7:24 AM   Discussed [place] with Dr. Adrian Blackwater Patient last ate and drank last night @ 11:00 Pm.    Assessment and Plan  25 y.o. [id number] at [redacted]w[redacted]d IUP   Left ectopic pregnancy  Dr. Adrian Blackwater at the bedside to discuss OR  Patient without pain currently  Duane Lope, NP 08/02/2015 11:50 AM

## 2015-08-02 NOTE — Transfer of Care (Signed)
Immediate Anesthesia Transfer of Care Note  Patient: Joann King  Procedure(s) Performed: Procedure(s) with comments: DIAGNOSTIC LAPAROSCOPY WITH REMOVAL OF ECTOPIC PREGNANCY (Left) - for ectopic   Patient Location: PACU  Anesthesia Type:General  Level of Consciousness: awake, alert , oriented and patient cooperative  Airway & Oxygen Therapy: Patient Spontanous Breathing and Patient connected to nasal cannula oxygen  Post-op Assessment: Report given to RN, Post -op Vital signs reviewed and stable and Patient moving all extremities X 4  Post vital signs: Reviewed and stable  Last Vitals:  Filed Vitals:   08/02/15 0715  BP: 111/64  Pulse: 81  Temp: 36.7 C  Resp: 18    Complications: No apparent anesthesia complications

## 2015-08-02 NOTE — MAU Note (Signed)
Pt states she was here yesterday and was seen for vaginal bleeding. Pt was at work last night and bleeding has been increasing and cramping increasing to the point she can't stand it anymore.

## 2015-08-02 NOTE — H&P (Addendum)
History   [id number]   Chief Complaint  Patient presents with  . Vaginal Bleeding  . Abdominal Cramping    HPI Joann King is a 25 y.o. female  [phone] at [redacted]w[redacted]d IUP here with report of increased pelvic pain.  Seen in MAU yesterday for bleeding and pain.  BHCG was 2833; ultrasound could not identify location of pregnancy.  No report of abnormal adnexal mass.  Pt was instructed to return this morning for repeat BHCG and ultrasound.  Pt arrived via EMS due to increased pain.  Bleeding has remained the same per patient.  Pain is located on left lower pelvis.      Patient's last menstrual period was 06/13/2015.  OB History  Gravida Para Term Preterm AB SAB TAB Ectopic Multiple Living  # Outcome Date GA Lbr Len/2nd Weight Sex Delivery Anes PTL Lv  4 Current           3 Term 10/07/13 [redacted]w[redacted]d  2.91 kg (6 lb 6.7 oz) M CS-LTranv   Y  2 Term 10/12/07 [redacted]w[redacted]d  3.43 kg (7 lb 9 oz) M CS-LTranv EPI  Y     Comments: c/section for CPD  1 AB               Past Medical History  Diagnosis Date  . Herpes genitalia     doesn't think ever had outbreak  . Anemia     during first pregnancy  . Anemia   . Heartburn in pregnancy     Family History  Problem Relation Age of Onset  . Diabetes Maternal Grandmother   . Hypertension Maternal Grandmother     Social History   Social History  . Marital Status: Single    Spouse Name: N/A  . Number of Children: N/A  . Years of Education: N/A   Social History Main Topics  . Smoking status: Former Smoker -- 0.50 packs/day    Types: Cigarettes  . Smokeless tobacco: Never Used  . Alcohol Use: Yes  . Drug Use: No  . Sexual Activity: Yes    Birth Control/ Protection: None     Comment: pregnant   Other Topics Concern  . None   Social History Narrative    No Known Allergies  No current facility-administered medications on file prior to encounter.   Current Outpatient Prescriptions on File Prior to Encounter  Medication Sig  Dispense Refill  . oxyCODONE-acetaminophen (PERCOCET/ROXICET) 5-325 MG tablet Take 1-2 tablets by mouth every 6 (six) hours as needed for severe pain. 15 tablet 0  . Prenat w/o A Vit-FeFum-FePo-FA (CONCEPT OB) 130-92.4-1 MG CAPS Take 1 tablet by mouth daily. 30 capsule 12  . promethazine (PHENERGAN) 12.5 MG tablet Take 1 tablet (12.5 mg total) by mouth every 6 (six) hours as needed for nausea or vomiting. (Patient not taking: Reported on 06/29/2015) 12 tablet 0   US Ob Comp Less 14 Wks  08/01/2015  CLINICAL DATA:  First trimester bleeding.  Abdominal pain. EXAM: OBSTETRIC <14 WK [place] AND TRANSVAGINAL OB US TECHNIQUE: Both transabdominal and transvaginal ultrasound examinations were performed for complete evaluation of the gestation as well as the maternal uterus, adnexal regions, and pelvic cul-de-sac. Transvaginal technique was performed to assess early pregnancy. COMPARISON:  None. FINDINGS: Intrauterine gestational sac: Not visualized Yolk sac:  Not visualized Embryo:  Not visualized Subchorionic hemorrhage:  No subchorionic hemorrhage Maternal uterus/adnexae: No adnexal mass. Small amount of free fluid. IMPRESSION:  No intrauterine pregnancy visualized. Differential considerations would include early intrauterine pregnancy too early to visualize, spontaneous abortion, or occult ectopic pregnancy. Recommend close clinical followup and serial quantitative beta HCGs and ultrasounds. Electronically Signed   By: Charlett Nose M.D.   On: 08/01/2015 13:07   US Ob Transvaginal  08/02/2015  CLINICAL DATA:  Vaginal bleeding during pregnancy. Quantitative beta HCG today is 1,947. Beta HCG yesterday was 2,833. By LMP patient is 7 weeks 1 day. EDC by LMP is 03/19/2016. LMP was 06/12/2016. EXAM: TRANSVAGINAL OB ULTRASOUND TECHNIQUE: Transvaginal ultrasound was performed for complete evaluation of the gestation as well as the maternal uterus, adnexal regions, and pelvic cul-de-sac. COMPARISON:  07/28/2015 FINDINGS:  Intrauterine gestational sac: Small irregular collections of fluid is identified in the endometrial canal without definite gestational sac. Yolk sac:  None Embryo:  None Cardiac Activity: None Heart Rate: Absent bpm Subchorionic hemorrhage:  None Maternal uterus/adnexae: Right ovary is not visualized. Left ovary has a normal appearance. Adjacent to the left ovary there is a soft tissue mass measuring 2.7 x 3.0 x 2.2 cm. Mass is avascular on Doppler evaluation. No definite gestational sac identified within this mass. There is a small amount of left and right adnexal fluid. IMPRESSION: 1. Interval development of small amount of fluid in the endometrial canal, favored to represent a pseudo gestational sac. 2. Solid mass the left adnexal suspicious for left ectopic pregnancy measuring 3.0 cm. 3. Small amount of free pelvic fluid. Critical Value/emergent results were called by telephone at the time of interpretation on 08/02/2015 at 10:00 am to provider Victorino Dike in MAU in who verbally acknowledged these results. Electronically Signed   By: Norva Pavlov M.D.   On: 08/02/2015 10:04   US Ob Transvaginal  08/01/2015  CLINICAL DATA:  First trimester bleeding.  Abdominal pain. EXAM: OBSTETRIC <14 WK [place] AND TRANSVAGINAL OB US TECHNIQUE: Both transabdominal and transvaginal ultrasound examinations were performed for complete evaluation of the gestation as well as the maternal uterus, adnexal regions, and pelvic cul-de-sac. Transvaginal technique was performed to assess early pregnancy. COMPARISON:  None. FINDINGS: Intrauterine gestational sac: Not visualized Yolk sac:  Not visualized Embryo:  Not visualized Subchorionic hemorrhage:  No subchorionic hemorrhage Maternal uterus/adnexae: No adnexal mass. Small amount of free fluid. IMPRESSION: No intrauterine pregnancy visualized. Differential considerations would include early intrauterine pregnancy too early to visualize, spontaneous abortion, or occult ectopic pregnancy.  Recommend close clinical followup and serial quantitative beta HCGs and ultrasounds. Electronically Signed   By: Charlett Nose M.D.   On: 08/01/2015 13:07     Review of Systems  Gastrointestinal: Negative for nausea and vomiting.  Genitourinary: Positive for vaginal bleeding and pelvic pain.  All other systems reviewed and are negative.    Physical Exam   Filed Vitals:   08/02/15 0715  BP: 111/64  Pulse: 81  Temp: 98 F (36.7 C)  TempSrc: Oral  Resp: 18  Height:  (1.651 m)  Weight: 86.183 kg (190 lb)    Physical Exam  Constitutional: She is oriented to person, place, and time. She appears well-developed and well-nourished.  HENT:  Head: Normocephalic.  Neck: Normal range of motion. Neck supple.  Cardiovascular: Normal rate, regular rhythm and normal heart sounds.   Respiratory: Effort normal and breath sounds normal. No respiratory distress.  GI: Soft. There is no tenderness.  Genitourinary: Uterus is not enlarged. Cervix exhibits no motion tenderness. Right adnexum displays no mass, no tenderness and no fullness. Left adnexum displays tenderness. Left adnexum displays  no fullness. There is bleeding (small amount of dark red blood) in the vagina.  Musculoskeletal: Normal range of motion. She exhibits no edema.  Neurological: She is alert and oriented to person, place, and time.  Skin: Skin is warm and dry.   Results for AKSHITHA, CULMER (MRN [id number]) as of 08/02/2015 11:03  Ref. Range 08/01/2015 12:23 08/02/2015 07:39  HCG, Beta Chain, Quant, S Latest Ref Range: <5 mIU/mL 2833 (H) 1947 (H)    US Ob Comp Less 14 Wks  08/01/2015  CLINICAL DATA:  First trimester bleeding.  Abdominal pain. EXAM: OBSTETRIC <14 WK [place] AND TRANSVAGINAL OB US TECHNIQUE: Both transabdominal and transvaginal ultrasound examinations were performed for complete evaluation of the gestation as well as the maternal uterus, adnexal regions, and pelvic cul-de-sac. Transvaginal technique was performed to  assess early pregnancy. COMPARISON:  None. FINDINGS: Intrauterine gestational sac: Not visualized Yolk sac:  Not visualized Embryo:  Not visualized Subchorionic hemorrhage:  No subchorionic hemorrhage Maternal uterus/adnexae: No adnexal mass. Small amount of free fluid. IMPRESSION: No intrauterine pregnancy visualized. Differential considerations would include early intrauterine pregnancy too early to visualize, spontaneous abortion, or occult ectopic pregnancy. Recommend close clinical followup and serial quantitative beta HCGs and ultrasounds. Electronically Signed   By: Charlett Nose M.D.   On: 08/01/2015 13:07   US Ob Transvaginal  08/02/2015  CLINICAL DATA:  Vaginal bleeding during pregnancy. Quantitative beta HCG today is 1,947. Beta HCG yesterday was 2,833. By LMP patient is 7 weeks 1 day. EDC by LMP is 03/19/2016. LMP was 06/12/2016. EXAM: TRANSVAGINAL OB ULTRASOUND TECHNIQUE: Transvaginal ultrasound was performed for complete evaluation of the gestation as well as the maternal uterus, adnexal regions, and pelvic cul-de-sac. COMPARISON:  07/28/2015 FINDINGS: Intrauterine gestational sac: Small irregular collections of fluid is identified in the endometrial canal without definite gestational sac. Yolk sac:  None Embryo:  None Cardiac Activity: None Heart Rate: Absent bpm Subchorionic hemorrhage:  None Maternal uterus/adnexae: Right ovary is not visualized. Left ovary has a normal appearance. Adjacent to the left ovary there is a soft tissue mass measuring 2.7 x 3.0 x 2.2 cm. Mass is avascular on Doppler evaluation. No definite gestational sac identified within this mass. There is a small amount of left and right adnexal fluid. IMPRESSION: 1. Interval development of small amount of fluid in the endometrial canal, favored to represent a pseudo gestational sac. 2. Solid mass the left adnexal suspicious for left ectopic pregnancy measuring 3.0 cm. 3. Small amount of free pelvic fluid. Critical Value/emergent  results were called by telephone at the time of interpretation on 08/02/2015 at 10:00 am to provider Victorino Dike in MAU in who verbally acknowledged these results. Electronically Signed   By: Norva Pavlov M.D.   On: 08/02/2015 10:04   US Ob Transvaginal  08/01/2015  CLINICAL DATA:  First trimester bleeding.  Abdominal pain. EXAM: OBSTETRIC <14 WK [place] AND TRANSVAGINAL OB US TECHNIQUE: Both transabdominal and transvaginal ultrasound examinations were performed for complete evaluation of the gestation as well as the maternal uterus, adnexal regions, and pelvic cul-de-sac. Transvaginal technique was performed to assess early pregnancy. COMPARISON:  None. FINDINGS: Intrauterine gestational sac: Not visualized Yolk sac:  Not visualized Embryo:  Not visualized Subchorionic hemorrhage:  No subchorionic hemorrhage Maternal uterus/adnexae: No adnexal mass. Small amount of free fluid. IMPRESSION: No intrauterine pregnancy visualized. Differential considerations would include early intrauterine pregnancy too early to visualize, spontaneous abortion, or occult ectopic pregnancy. Recommend close clinical followup and serial quantitative beta HCGs and ultrasounds. Electronically  Signed   By: Charlett Nose M.D.   On: 08/01/2015 13:07     MAU Course  Procedures IV LR 1000cc IV Dilaudid 2 mg   MDM 0735 Dr. Penne Lash notified regarding patient's arrival 0800 Report given to [facility] who assumes care of patient.  Marlis Edelson, CNM 08/02/2015 7:24 AM   Discussed [place] with Dr. Adrian Blackwater Patient last ate and drank last night @ 11:00 Pm.    Assessment and Plan  24 y.o. [id number] at [redacted]w[redacted]d IUP   #1 Left ectopic pregnancy  Patient consented for laparoscopy with unilateral salpingectomy with Dr Jolayne Panther.  Discussed risks of bleeding, infection, injury to adjacent organs, pain.  All questions answered.  CBC and Type and screen pending.  Pt to the OR when ready.  Levie Heritage, DO 08/02/2015 11:01 AM

## 2015-08-02 NOTE — Anesthesia Preprocedure Evaluation (Addendum)
Anesthesia Evaluation  Patient identified by MRN, date of birth, ID band Patient awake    Reviewed: Allergy & Precautions, NPO status , Patient's Chart, lab work & pertinent test results  Airway Mallampati: I  TM Distance: >3 FB Neck ROM: Full    Dental  (+) Teeth Intact   Pulmonary former smoker,    breath sounds clear to auscultation       Cardiovascular negative cardio ROS   Rhythm:Regular Rate:Normal     Neuro/Psych negative neurological ROS  negative psych ROS   GI/Hepatic negative GI ROS, Neg liver ROS,   Endo/Other  negative endocrine ROS  Renal/GU negative Renal ROS  negative genitourinary   Musculoskeletal negative musculoskeletal ROS (+)   Abdominal   Peds negative pediatric ROS (+)  Hematology negative hematology ROS (+)   Anesthesia Other Findings   Reproductive/Obstetrics negative OB ROS                            Lab Results  Component Value Date   WBC 6.6 08/01/2015   HGB 11.6* 08/01/2015   HCT 34.4* 08/01/2015   MCV 89.6 08/01/2015   PLT 341 08/01/2015   Lab Results  Component Value Date   CREATININE 0.86 03/10/2014   BUN 8 03/10/2014   NA 137 03/10/2014   K 3.4* 03/10/2014   CL 101 03/10/2014   CO2 23 03/10/2014   No results found for: INR, PROTIME   Anesthesia Physical Anesthesia Plan  ASA: II and emergent  Anesthesia Plan: General   Post-op Pain Management:    Induction: Intravenous, Rapid sequence and Cricoid pressure planned  Airway Management Planned: Oral ETT  Additional Equipment:   Intra-op Plan:   Post-operative Plan: Extubation in OR  Informed Consent: I have reviewed the patients History and Physical, chart, labs and discussed the procedure including the risks, benefits and alternatives for the proposed anesthesia with the patient or authorized representative who has indicated his/her understanding and acceptance.   Dental  advisory given  Plan Discussed with: CRNA  Anesthesia Plan Comments:         Anesthesia Quick Evaluation

## 2015-08-02 NOTE — Discharge Instructions (Signed)
Diagnostic Laparoscopy A diagnostic laparoscopy is a procedure to diagnose diseases in the abdomen. During the procedure, a thin, lighted, pencil-sized instrument called a laparoscope is inserted into the abdomen through an incision. The laparoscope allows your health care provider to look at the organs inside your body. LET [facility] CARE PROVIDER KNOW ABOUT:  Any allergies you have.  All medicines you are taking, including vitamins, herbs, eye drops, creams, and over-the-counter medicines.  Previous problems you or members of your family have had with the use of anesthetics.  Any blood disorders you have.  Previous surgeries you have had.  Medical conditions you have. RISKS AND COMPLICATIONS  Generally, this is a safe procedure. However, problems can occur, which may include:  Infection.  Bleeding.  Damage to other organs.  Allergic reaction to the anesthetics used during the procedure. BEFORE THE PROCEDURE  Do not eat or drink anything after midnight on the night before the procedure or as directed by your health care provider.  Ask your health care provider about:  Changing or stopping your regular medicines.  Taking medicines such as aspirin and ibuprofen. These medicines can thin your blood. Do not take these medicines before your procedure if your health care provider instructs you not to.  Plan to have someone take you home after the procedure. PROCEDURE  You may be given a medicine to help you relax (sedative).  You will be given a medicine to make you sleep (general anesthetic).  Your abdomen will be inflated with a gas. This will make your organs easier to see.  Small incisions will be made in your abdomen.  A laparoscope and other small instruments will be inserted into the abdomen through the incisions.  A tissue sample may be removed from an organ in the abdomen for examination.  The instruments will be removed from the abdomen.  The gas will be  released.  The incisions will be closed with stitches (sutures). AFTER THE PROCEDURE  Your blood pressure, heart rate, breathing rate, and blood oxygen level will be monitored often until the medicines you were given have worn off.   This information is not intended to replace advice given to you by your health care provider. Make sure you discuss any questions you have with your health care provider.   Document Released: 09/25/2000 Document Revised: 03/10/2015 Document Reviewed: 01/30/2014 Elsevier Interactive Patient Education 2016 [employer] Ectopic Pregnancy An ectopic pregnancy is when the fertilized egg attaches (implants) outside the uterus. Most ectopic pregnancies occur in the fallopian tube. Rarely do ectopic pregnancies occur on the ovary, intestine, pelvis, or cervix. In an ectopic pregnancy, the fertilized egg does not have the ability to develop into a normal, healthy baby.  A ruptured ectopic pregnancy is one in which the fallopian tube gets torn or bursts and results in internal bleeding. Often there is intense abdominal pain, and sometimes, vaginal bleeding. Having an ectopic pregnancy can be life threatening. If left untreated, this dangerous condition can lead to a blood transfusion, abdominal surgery, or even death. CAUSES  Damage to the fallopian tubes is the suspected cause in most ectopic pregnancies.  RISK FACTORS Depending on your circumstances, the risk of having an ectopic pregnancy will vary. The level of risk can be divided into three categories. High Risk  You have gone through infertility treatment.  You have had a previous ectopic pregnancy.  You have had previous tubal surgery.  You have had previous surgery to have the fallopian tubes tied (tubal ligation).  You have tubal problems or diseases.  You have been exposed to DES. DES is a medicine that was used until 1971 and had effects on babies whose mothers took the medicine.  You become pregnant  while using an intrauterine device (IUD) for birth control. Moderate Risk  You have a history of infertility.  You have a history of a sexually transmitted infection (STI).  You have a history of pelvic inflammatory disease (PID).  You have scarring from endometriosis.  You have multiple sexual partners.  You smoke. Low Risk  You have had previous pelvic surgery.  You use vaginal douching.  You became sexually active before 26 years of age. SIGNS AND SYMPTOMS  An ectopic pregnancy should be suspected in anyone who has missed a period and has abdominal pain or bleeding.  You may experience normal pregnancy symptoms, such as:  Nausea.  Tiredness.  Breast tenderness.  Other symptoms may include:  Pain with intercourse.  Irregular vaginal bleeding or spotting.  Cramping or pain on one side or in the lower abdomen.  Fast heartbeat.  Passing out while having a bowel movement.  Symptoms of a ruptured ectopic pregnancy and internal bleeding may include:  Sudden, severe pain in the abdomen and pelvis.  Dizziness or fainting.  Pain in the shoulder area. DIAGNOSIS  Tests that may be performed include:  A pregnancy test.  An ultrasound test.  Testing the specific level of pregnancy hormone in the bloodstream.  Taking a sample of uterus tissue (dilation and curettage, D&C).  Surgery to perform a visual exam of the inside of the abdomen using a thin, lighted tube with a tiny camera on the end (laparoscope). TREATMENT  An injection of a medicine called methotrexate may be given. This medicine causes the pregnancy tissue to be absorbed. It is given if:  The diagnosis is made early.  The fallopian tube has not ruptured.  You are considered to be a good candidate for the medicine. Usually, pregnancy hormone blood levels are checked after methotrexate treatment. This is to be sure the medicine is effective. It may take 4-6 weeks for the pregnancy to be absorbed  (though most pregnancies will be absorbed by 3 weeks). Surgical treatment may be needed. A laparoscope may be used to remove the pregnancy tissue. If severe internal bleeding occurs, a cut (incision) may be made in the lower abdomen (laparotomy), and the ectopic pregnancy is removed. This stops the bleeding. Part of the fallopian tube, or the whole tube, may be removed as well (salpingectomy). After surgery, pregnancy hormone tests may be done to be sure there is no pregnancy tissue left. You may receive a Rho (D) immune globulin shot if you are Rh negative and the father is Rh positive, or if you do not know the Rh type of the father. This is to prevent problems with any future pregnancy. SEEK IMMEDIATE MEDICAL CARE IF:  You have any symptoms of an ectopic pregnancy. This is a medical emergency. MAKE SURE YOU:  Understand these instructions.  Will watch your condition.  Will get help right away if you are not doing well or get worse.   This information is not intended to replace advice given to you by your health care provider. Make sure you discuss any questions you have with your health care provider.   Document Released: 07/27/2004 Document Revised: 07/10/2014 Document Reviewed: 01/16/2013 Elsevier Interactive Patient Education [employer]

## 2015-08-03 ENCOUNTER — Encounter (HOSPITAL_COMMUNITY): Payer: Self-pay | Admitting: Obstetrics and Gynecology

## 2015-08-18 ENCOUNTER — Encounter: Payer: Self-pay | Admitting: Obstetrics and Gynecology

## 2015-08-18 ENCOUNTER — Ambulatory Visit (INDEPENDENT_AMBULATORY_CARE_PROVIDER_SITE_OTHER): Payer: Self-pay | Admitting: Obstetrics and Gynecology

## 2015-08-18 VITALS — BP 106/79 | HR 73 | Temp 98.2°F | Wt 197.5 lb

## 2015-08-18 DIAGNOSIS — Z9889 Other specified postprocedural states: Secondary | ICD-10-CM

## 2015-08-18 LAB — POCT PREGNANCY, URINE: Preg Test, Ur: NEGATIVE

## 2015-08-18 NOTE — Progress Notes (Signed)
Patient ID: Joann King, female   DOB: 14-Jun-1991, 25 y.o.   MRN: [id number] 25 yo here for post op check. Patient underwent a laparoscopic left salpingectomy for the treatment of an ectopic pregnancy. Patient reports feeling well since the surgery. She has returned back to work. She has been sexually active without contraception.  Past Medical History  Diagnosis Date  . Herpes genitalia     doesn't think ever had outbreak  . Anemia     during first pregnancy  . Anemia   . Heartburn in pregnancy    Past Surgical History  Procedure Laterality Date  . Cesarean section  2009  . Cesarean section N/A 10/07/2013    Procedure: CESAREAN SECTION;  Surgeon: Lesly Dukes, MD;  Location: [facility];  Service: Obstetrics;  Laterality: N/A;  . Diagnostic laparoscopy with removal of ectopic pregnancy Left 08/02/2015    Procedure: DIAGNOSTIC LAPAROSCOPY WITH REMOVAL OF ECTOPIC PREGNANCY;  Surgeon: Catalina Antigua, MD;  Location: [facility];  Service: Gynecology;  Laterality: Left;  for ectopic    Family History  Problem Relation Age of Onset  . Diabetes Maternal Grandmother   . Hypertension Maternal Grandmother    Social History  Substance Use Topics  . Smoking status: Former Smoker -- 0.50 packs/day    Types: Cigarettes  . Smokeless tobacco: Never Used  . Alcohol Use: Yes   ROS See pertinent in HPI Blood pressure 106/79, pulse 73, temperature 98.2 F (36.8 C), temperature source Oral, weight 197 lb 8 oz (89.585 kg), last menstrual period 06/13/2015, not currently breastfeeding.  GENERAL: Well-developed, well-nourished female in no acute distress.  ABDOMEN: Soft, nontender, nondistended.  INCISIONS: no erythema, induration or drainage PELVIC: Not indicated EXTREMITIES: No cyanosis, clubbing, or edema, 2+ distal pulses.  A/P 25 yo s/p left salpingectomy for the treatment of an ectopic pregnancy - Patient is medically cleared to resume all activities of daily living - This was not a planned  pregnancy. She is interested in resuming depo-provera.  - Patient was instructed to abstain for the next 2 weeks and to return for depo-provera -RTC prn

## 2015-08-30 ENCOUNTER — Telehealth: Payer: Self-pay | Admitting: *Deleted

## 2015-08-30 NOTE — Telephone Encounter (Signed)
Pt left message requesting results from 2/15.

## 2015-08-30 NOTE — Telephone Encounter (Signed)
Called patient & informed her of negative results. Patient verbalized understanding & had no questions

## 2015-12-18 ENCOUNTER — Inpatient Hospital Stay (HOSPITAL_COMMUNITY)
Admission: AD | Admit: 2015-12-18 | Discharge: 2015-12-18 | Disposition: A | Discharge status: Home / Self Care | Payer: No Typology Code available for payment source | Source: Ambulatory Visit | Attending: Obstetrics and Gynecology | Admitting: Obstetrics and Gynecology

## 2015-12-18 ENCOUNTER — Encounter (HOSPITAL_COMMUNITY): Payer: Self-pay | Admitting: Certified Nurse Midwife

## 2015-12-18 DIAGNOSIS — Z87891 Personal history of nicotine dependence: Secondary | ICD-10-CM | POA: Insufficient documentation

## 2015-12-18 DIAGNOSIS — N940 Mittelschmerz: Secondary | ICD-10-CM | POA: Insufficient documentation

## 2015-12-18 DIAGNOSIS — R1032 Left lower quadrant pain: Secondary | ICD-10-CM

## 2015-12-18 LAB — URINE MICROSCOPIC-ADD ON

## 2015-12-18 LAB — URINALYSIS, ROUTINE W REFLEX MICROSCOPIC
BILIRUBIN URINE: NEGATIVE
Glucose, UA: NEGATIVE mg/dL
HGB URINE DIPSTICK: NEGATIVE
KETONES UR: NEGATIVE mg/dL
NITRITE: NEGATIVE
PROTEIN: NEGATIVE mg/dL
pH: 6 (ref 5.0–8.0)

## 2015-12-18 LAB — POCT PREGNANCY, URINE: PREG TEST UR: NEGATIVE

## 2015-12-18 NOTE — MAU Note (Signed)
Pain lower left side of belly for x 1 week.  Takes motrin and tylenol. Took two advil last night.  Had a tubal pregnancy in Feb with surgery

## 2015-12-18 NOTE — Discharge Instructions (Signed)
Continue to take Advil by the package directions for at least 24 hours and then as needed. Make an appointment in the clinic for contraception. Condoms always for pregnancy protection.

## 2015-12-18 NOTE — MAU Provider Note (Signed)
History     CSN: [id number]  Arrival date and time: 12/18/15 1003   First Provider Initiated Contact with Patient 12/18/15 1053      Chief Complaint  Patient presents with  . Abdominal Pain   HPI Joann GammonDiesha M King 24 y.o.  Comes to MAU with periodic left lower abdominal pain for the past 2 days.  Takes Aleve for the pain  - last dose was while at work last night.  Currently does not have pain but thinks it will be returning.  Had left ectopic in February and thinks it may be related.  LMP June 3 so this is also the time she might be ovulating - client unaware that she might be ovulating.  Had questions about whether her tube was removed during surgery and what this pain is coming from.  OB History    Gravida Para Term Preterm AB TAB SAB Ectopic Multiple Living   4 2 2  1     2       Past Medical History  Diagnosis Date  . Herpes genitalia     doesn't think ever had outbreak  . Anemia     during first pregnancy  . Anemia   . Heartburn in pregnancy     Past Surgical History  Procedure Laterality Date  . Cesarean section  2009  . Cesarean section N/A 10/07/2013    Procedure: CESAREAN SECTION;  Surgeon: Lesly DukesKelly H Leggett, MD;  Location: [facility];  Service: Obstetrics;  Laterality: N/A;  . Diagnostic laparoscopy with removal of ectopic pregnancy Left 08/02/2015    Procedure: DIAGNOSTIC LAPAROSCOPY WITH REMOVAL OF ECTOPIC PREGNANCY;  Surgeon: Catalina AntiguaPeggy Constant, MD;  Location: [facility];  Service: Gynecology;  Laterality: Left;  for ectopic     Family History  Problem Relation Age of Onset  . Diabetes Maternal Grandmother   . Hypertension Maternal Grandmother     Social History  Substance Use Topics  . Smoking status: Former Smoker -- 0.50 packs/day    Types: Cigarettes  . Smokeless tobacco: Never Used  . Alcohol Use: Yes    Allergies: No Known Allergies  Prescriptions prior to admission  Medication Sig Dispense Refill Last Dose  . ibuprofen (ADVIL,MOTRIN) 600 MG tablet Take 1  tablet (600 mg total) by mouth every 6 (six) hours as needed. 30 tablet 1 12/17/2015 at Unknown time  . docusate sodium (COLACE) 100 MG capsule Take 1 capsule (100 mg total) by mouth 2 (two) times daily as needed. (Patient not taking: Reported on 08/18/2015) 30 capsule 2 Not Taking  . oxyCODONE-acetaminophen (PERCOCET/ROXICET) 5-325 MG tablet Take 1 tablet by mouth every 4 (four) hours as needed. (Patient not taking: Reported on 08/18/2015) 20 tablet 0 Not Taking    Review of Systems  Constitutional: Negative for fever.  Gastrointestinal: Positive for abdominal pain. Negative for nausea and vomiting.  Genitourinary:       No vaginal discharge. No vaginal bleeding. No dysuria.   Physical Exam   Blood pressure 141/84, pulse 66, temperature 97.9 F (36.6 C), temperature source Oral, resp. rate 18, height 5\' 5"  (1.651 m), weight 200 lb 6.4 oz (90.901 kg), last menstrual period 12/06/2015.  Physical Exam  Nursing note and vitals reviewed. Constitutional: She is oriented to person, place, and time. She appears well-developed and well-nourished.  HENT:  Head: Normocephalic.  Eyes: EOM are normal.  Neck: Neck supple.  GI: Soft.  Mild tenderness with moderate palpation in LLQ.  No masses.  Nontender in all other quadrants.  Musculoskeletal: Normal range of motion.  Neurological: She is alert and oriented to person, place, and time.  Skin: Skin is warm and dry.  Psychiatric: She has a normal mood and affect.    MAU Course  Procedures Results for orders placed or performed during the hospital encounter of 12/18/15 (from the past 24 hour(s))  Urinalysis, Routine w reflex microscopic (not at [facility])     Status: Abnormal   Collection Time: 12/18/15 10:05 AM  Result Value Ref Range   Color, Urine YELLOW YELLOW   APPearance CLEAR CLEAR   Specific Gravity, Urine <1.005 (L) 1.005 - 1.030   pH 6.0 5.0 - 8.0   Glucose, UA NEGATIVE NEGATIVE mg/dL   Hgb urine dipstick NEGATIVE NEGATIVE   Bilirubin  Urine NEGATIVE NEGATIVE   Ketones, ur NEGATIVE NEGATIVE mg/dL   Protein, ur NEGATIVE NEGATIVE mg/dL   Nitrite NEGATIVE NEGATIVE   Leukocytes, UA TRACE (A) NEGATIVE  Urine microscopic-add on     Status: Abnormal   Collection Time: 12/18/15 10:05 AM  Result Value Ref Range   Squamous Epithelial / LPF 0-5 (A) NONE SEEN   WBC, UA 0-5 0 - 5 WBC/hpf   RBC / HPF 0-5 0 - 5 RBC/hpf   Bacteria, UA FEW (A) NONE SEEN  Pregnancy, urine POC     Status: None   Collection Time: 12/18/15 10:19 AM  Result Value Ref Range   Preg Test, Ur NEGATIVE NEGATIVE    MDM Reviewed operative note from February and discussed with client that her left tube was removed.  Based on LMP of June 3 at this time she should be ovulating which can cause lower abdominal pain.  Client agrees she is still sad about the loss of the pregnancy.  Reassured and advised condoms always unless she is planning a pregnancy.  Assessment and Plan  [place] pain  Plan Continue to take Advil as you have been doing for 24 hours. Make an appointment in the clinic for contraception. Condoms always for contraception for now. Client was reassured and her questions were answered.  Much calmer and not currently in pain.   Joann King 12/18/2015, 11:06 AM

## 2016-01-15 ENCOUNTER — Inpatient Hospital Stay (HOSPITAL_COMMUNITY)
Admission: AD | Admit: 2016-01-15 | Discharge: 2016-01-15 | Disposition: A | Discharge status: Home / Self Care | Payer: Medicaid Other | Source: Ambulatory Visit | Attending: Obstetrics & Gynecology | Admitting: Obstetrics & Gynecology

## 2016-01-15 ENCOUNTER — Encounter (HOSPITAL_COMMUNITY): Payer: Self-pay | Admitting: *Deleted

## 2016-01-15 ENCOUNTER — Inpatient Hospital Stay (HOSPITAL_COMMUNITY): Payer: Medicaid Other

## 2016-01-15 DIAGNOSIS — R110 Nausea: Secondary | ICD-10-CM | POA: Insufficient documentation

## 2016-01-15 DIAGNOSIS — O26899 Other specified pregnancy related conditions, unspecified trimester: Secondary | ICD-10-CM

## 2016-01-15 DIAGNOSIS — O26891 Other specified pregnancy related conditions, first trimester: Secondary | ICD-10-CM | POA: Diagnosis not present

## 2016-01-15 DIAGNOSIS — Z3A01 Less than 8 weeks gestation of pregnancy: Secondary | ICD-10-CM | POA: Insufficient documentation

## 2016-01-15 DIAGNOSIS — Z87891 Personal history of nicotine dependence: Secondary | ICD-10-CM | POA: Diagnosis not present

## 2016-01-15 DIAGNOSIS — O209 Hemorrhage in early pregnancy, unspecified: Secondary | ICD-10-CM | POA: Insufficient documentation

## 2016-01-15 DIAGNOSIS — R1032 Left lower quadrant pain: Secondary | ICD-10-CM | POA: Insufficient documentation

## 2016-01-15 DIAGNOSIS — O3442 Maternal care for other abnormalities of cervix, second trimester: Secondary | ICD-10-CM | POA: Diagnosis not present

## 2016-01-15 DIAGNOSIS — N888 Other specified noninflammatory disorders of cervix uteri: Secondary | ICD-10-CM | POA: Insufficient documentation

## 2016-01-15 DIAGNOSIS — R109 Unspecified abdominal pain: Secondary | ICD-10-CM

## 2016-01-15 DIAGNOSIS — O219 Vomiting of pregnancy, unspecified: Secondary | ICD-10-CM

## 2016-01-15 LAB — WET PREP, GENITAL
Sperm: NONE SEEN
TRICH WET PREP: NONE SEEN
WBC, Wet Prep HPF POC: NONE SEEN
Yeast Wet Prep HPF POC: NONE SEEN

## 2016-01-15 LAB — URINALYSIS, ROUTINE W REFLEX MICROSCOPIC
BILIRUBIN URINE: NEGATIVE
GLUCOSE, UA: NEGATIVE mg/dL
HGB URINE DIPSTICK: NEGATIVE
KETONES UR: NEGATIVE mg/dL
Nitrite: NEGATIVE
PH: 6 (ref 5.0–8.0)
Protein, ur: NEGATIVE mg/dL
Specific Gravity, Urine: <1.005 — ABNORMAL LOW (ref 1.005–1.030)

## 2016-01-15 LAB — CBC
HCT: 32.9 % — ABNORMAL LOW (ref 36.0–46.0)
Hemoglobin: 11.7 g/dL — ABNORMAL LOW (ref 12.0–15.0)
MCH: 31 pg (ref 26.0–34.0)
MCHC: 35.6 g/dL (ref 30.0–36.0)
MCV: 87 fL (ref 78.0–100.0)
PLATELETS: 312 10*3/uL (ref 150–400)
RBC: 3.78 MIL/uL — ABNORMAL LOW (ref 3.87–5.11)
RDW: 13.7 % (ref 11.5–15.5)
WBC: 6.7 10*3/uL (ref 4.0–10.5)

## 2016-01-15 LAB — URINE MICROSCOPIC-ADD ON: RBC / HPF: NONE SEEN RBC/hpf (ref 0–5)

## 2016-01-15 LAB — OB RESULTS CONSOLE GC/CHLAMYDIA: GC PROBE AMP, GENITAL: NEGATIVE

## 2016-01-15 LAB — HCG, QUANTITATIVE, PREGNANCY: HCG, BETA CHAIN, QUANT, S: 28721 m[IU]/mL — AB (ref <5)

## 2016-01-15 LAB — OB RESULTS CONSOLE PLATELET COUNT: Platelets: 312 10*3/uL

## 2016-01-15 LAB — POCT PREGNANCY, URINE: Preg Test, Ur: POSITIVE — AB

## 2016-01-15 LAB — OB RESULTS CONSOLE HGB/HCT, BLOOD
HCT: 33 %
HEMOGLOBIN: 11.7 g/dL

## 2016-01-15 LAB — HIV ANTIBODY (ROUTINE TESTING W REFLEX): HIV SCREEN 4TH GENERATION: NONREACTIVE

## 2016-01-15 MED ORDER — ONDANSETRON 4 MG PO TBDP
4.0000 mg | ORAL_TABLET | Freq: Once | ORAL | Status: AC
  Administered 2016-01-15: 4 mg via ORAL
  Filled 2016-01-15: qty 1

## 2016-01-15 MED ORDER — DOXYLAMINE-PYRIDOXINE 10-10 MG PO TBEC: 1.0000 | DELAYED_RELEASE_TABLET | Freq: Every evening | ORAL | Status: DC | PRN

## 2016-01-15 NOTE — Discharge Instructions (Signed)
Morning Sickness °Morning sickness is when you feel sick to your stomach (nauseous) during pregnancy. This nauseous feeling may or may not come with vomiting. It often occurs in the morning but can be a problem any time of day. Morning sickness is most common during the first trimester, but it may continue throughout pregnancy. While morning sickness is unpleasant, it is usually harmless unless you develop severe and continual vomiting (hyperemesis gravidarum). This condition requires more intense treatment.  °CAUSES  °The cause of morning sickness is not completely known but seems to be related to normal hormonal changes that occur in pregnancy. °RISK FACTORS °You are at greater risk if you: °· Experienced nausea or vomiting before your pregnancy. °· Had morning sickness during a previous pregnancy. °· Are pregnant with more than one baby, such as twins. °TREATMENT  °Do not use any medicines (prescription, over-the-counter, or herbal) for morning sickness without first talking to your health care provider. Your health care provider may prescribe or recommend: °· Vitamin B6 supplements. °· Anti-nausea medicines. °· The herbal medicine ginger. °HOME CARE INSTRUCTIONS  °· Only take over-the-counter or prescription medicines as directed by your health care provider. °· Taking multivitamins before getting pregnant can prevent or decrease the severity of morning sickness in most women. °· Eat a piece of dry toast or unsalted crackers before getting out of bed in the morning. °· Eat five or six small meals a day. °· Eat dry and bland foods (rice, baked potato). Foods high in carbohydrates are often helpful. °· Do not drink liquids with your meals. Drink liquids between meals. °· Avoid greasy, fatty, and spicy foods. °· Get someone to cook for you if the smell of any food causes nausea and vomiting. °· If you feel nauseous after taking prenatal vitamins, take the vitamins at night or with a snack.  °· Snack on protein  foods (nuts, yogurt, cheese) between meals if you are hungry. °· Eat unsweetened gelatins for desserts. °· Wearing an acupressure wristband (worn for sea sickness) may be helpful. °· Acupuncture may be helpful. °· Do not smoke. °· Get a humidifier to keep the air in your house free of odors. °· Get plenty of fresh air. °SEEK MEDICAL CARE IF:  °· Your home remedies are not working, and you need medicine. °· You feel dizzy or lightheaded. °· You are losing weight. °SEEK IMMEDIATE MEDICAL CARE IF:  °· You have persistent and uncontrolled nausea and vomiting. °· You pass out (faint). °MAKE SURE YOU: °· Understand these instructions. °· Will watch your condition. °· Will get help right away if you are not doing well or get worse. °  °This information is not intended to replace advice given to you by your health care provider. Make sure you discuss any questions you have with your health care provider. °  °Document Released: 08/10/2006 Document Revised: 06/24/2013 Document Reviewed: 12/04/2012 °Elsevier Interactive Patient Education ©2016 [employer] °First Trimester of Pregnancy °The first trimester of pregnancy is from week 1 until the end of week 12 (months 1 through 3). A week after a sperm fertilizes an egg, the egg will implant on the wall of the uterus. This embryo will begin to develop into a baby. Genes from you and your partner are forming the baby. The female genes determine whether the baby is a boy or a girl. At 6-8 weeks, the eyes and face are formed, and the heartbeat can be seen on ultrasound. At the end of 12 weeks, all the baby's organs   are formed.  °Now that you are pregnant, you will want to do everything you can to have a healthy baby. Two of the most important things are to get good prenatal care and to follow your health care provider's instructions. Prenatal care is all the medical care you receive before the baby's birth. This care will help prevent, find, and treat any problems during the  pregnancy and childbirth. °BODY CHANGES °Your body goes through many changes during pregnancy. The changes vary from woman to woman.  °· You may gain or lose a couple of pounds at first. °· You may feel sick to your stomach (nauseous) and throw up (vomit). If the vomiting is uncontrollable, call your health care provider. °· You may tire easily. °· You may develop headaches that can be relieved by medicines approved by your health care provider. °· You may urinate more often. Painful urination may mean you have a bladder infection. °· You may develop heartburn as a result of your pregnancy. °· You may develop constipation because certain hormones are causing the muscles that push waste through your intestines to slow down. °· You may develop hemorrhoids or swollen, bulging veins (varicose veins). °· Your breasts may begin to grow larger and become tender. Your nipples may stick out more, and the tissue that surrounds them (areola) may become darker. °· Your gums may bleed and may be sensitive to brushing and flossing. °· Dark spots or blotches (chloasma, mask of pregnancy) may develop on your face. This will likely fade after the baby is born. °· Your menstrual periods will stop. °· You may have a loss of appetite. °· You may develop cravings for certain kinds of food. °· You may have changes in your emotions from day to day, such as being excited to be pregnant or being concerned that something may go wrong with the pregnancy and baby. °· You may have more vivid and strange dreams. °· You may have changes in your hair. These can include thickening of your hair, rapid growth, and changes in texture. Some women also have hair loss during or after pregnancy, or hair that feels dry or thin. Your hair will most likely return to normal after your baby is born. °WHAT TO EXPECT AT YOUR PRENATAL VISITS °During a routine prenatal visit: °· You will be weighed to make sure you and the baby are growing normally. °· Your blood  pressure will be taken. °· Your abdomen will be measured to track your baby's growth. °· The fetal heartbeat will be listened to starting around week 10 or 12 of your pregnancy. °· Test results from any previous visits will be discussed. °Your health care provider may ask you: °· How you are feeling. °· If you are feeling the baby move. °· If you have had any abnormal symptoms, such as leaking fluid, bleeding, severe headaches, or abdominal cramping. °· If you are using any tobacco products, including cigarettes, chewing tobacco, and electronic cigarettes. °· If you have any questions. °Other tests that may be performed during your first trimester include: °· Blood tests to find your blood type and to check for the presence of any previous infections. They will also be used to check for low iron levels (anemia) and Rh antibodies. Later in the pregnancy, blood tests for diabetes will be done along with other tests if problems develop. °· Urine tests to check for infections, diabetes, or protein in the urine. °· An ultrasound to confirm the proper growth and development of   the baby. °· An amniocentesis to check for possible genetic problems. °· Fetal screens for spina bifida and Down syndrome. °· You may need other tests to make sure you and the baby are doing well. °· HIV (human immunodeficiency virus) testing. Routine prenatal testing includes screening for HIV, unless you choose not to have this test. °HOME CARE INSTRUCTIONS  °Medicines °· Follow your health care provider's instructions regarding medicine use. Specific medicines may be either safe or unsafe to take during pregnancy. °· Take your prenatal vitamins as directed. °· If you develop constipation, try taking a stool softener if your health care provider approves. °Diet °· Eat regular, well-balanced meals. Choose a variety of foods, such as meat or vegetable-based protein, fish, milk and low-fat dairy products, vegetables, fruits, and whole grain breads  and cereals. Your health care provider will help you determine the amount of weight gain that is right for you. °· Avoid raw meat and uncooked cheese. These carry germs that can cause birth defects in the baby. °· Eating four or five small meals rather than three large meals a day may help relieve nausea and vomiting. If you start to feel nauseous, eating a few soda crackers can be helpful. Drinking liquids between meals instead of during meals also seems to help nausea and vomiting. °· If you develop constipation, eat more high-fiber foods, such as fresh vegetables or fruit and whole grains. Drink enough fluids to keep your urine clear or pale yellow. °Activity and Exercise °· Exercise only as directed by your health care provider. Exercising will help you: °¨ Control your weight. °¨ Stay in shape. °¨ Be prepared for labor and delivery. °· Experiencing pain or cramping in the lower abdomen or low back is a good sign that you should stop exercising. Check with your health care provider before continuing normal exercises. °· Try to avoid standing for long periods of time. Move your legs often if you must stand in one place for a long time. °· Avoid heavy lifting. °· Wear low-heeled shoes, and practice good posture. °· You may continue to have sex unless your health care provider directs you otherwise. °Relief of Pain or Discomfort °· Wear a good support bra for breast tenderness.   °· Take warm sitz baths to soothe any pain or discomfort caused by hemorrhoids. Use hemorrhoid cream if your health care provider approves.   °· Rest with your legs elevated if you have leg cramps or low back pain. °· If you develop varicose veins in your legs, wear support hose. Elevate your feet for 15 minutes, 3-4 times a day. Limit salt in your diet. °Prenatal Care °· Schedule your prenatal visits by the twelfth week of pregnancy. They are usually scheduled monthly at first, then more often in the last 2 months before  delivery. °· Write down your questions. Take them to your prenatal visits. °· Keep all your prenatal visits as directed by your health care provider. °Safety °· Wear your seat belt at all times when driving. °· Make a list of emergency phone numbers, including numbers for family, friends, the hospital, and police and fire departments. °General Tips °· Ask your health care provider for a referral to a local prenatal education class. Begin classes no later than at the beginning of month 6 of your pregnancy. °· Ask for help if you have counseling or nutritional needs during pregnancy. Your health care provider can offer advice or refer you to specialists for help with various needs. °· Do not use   hot tubs, steam rooms, or saunas. °· Do not douche or use tampons or scented sanitary pads. °· Do not cross your legs for long periods of time. °· Avoid cat litter boxes and soil used by cats. These carry germs that can cause birth defects in the baby and possibly loss of the fetus by miscarriage or stillbirth. °· Avoid all smoking, herbs, alcohol, and medicines not prescribed by your health care provider. Chemicals in these affect the formation and growth of the baby. °· Do not use any tobacco products, including cigarettes, chewing tobacco, and electronic cigarettes. If you need help quitting, ask your health care provider. You may receive counseling support and other resources to help you quit. °· Schedule a dentist appointment. At home, brush your teeth with a soft toothbrush and be gentle when you floss. °SEEK MEDICAL CARE IF:  °· You have dizziness. °· You have mild pelvic cramps, pelvic pressure, or nagging pain in the abdominal area. °· You have persistent nausea, vomiting, or diarrhea. °· You have a bad smelling vaginal discharge. °· You have pain with urination. °· You notice increased swelling in your face, hands, legs, or ankles. °SEEK IMMEDIATE MEDICAL CARE IF:  °· You have a fever. °· You are leaking fluid from  your vagina. °· You have spotting or bleeding from your vagina. °· You have severe abdominal cramping or pain. °· You have rapid weight gain or loss. °· You vomit blood or material that looks like coffee grounds. °· You are exposed to German measles and have never had them. °· You are exposed to fifth disease or chickenpox. °· You develop a severe headache. °· You have shortness of breath. °· You have any kind of trauma, such as from a fall or a car accident. °  °This information is not intended to replace advice given to you by your health care provider. Make sure you discuss any questions you have with your health care provider. °  °Document Released: 06/13/2001 Document Revised: 07/10/2014 Document Reviewed: 04/29/2013 °Elsevier Interactive Patient Education ©2016 Elsevier Inc. ° °

## 2016-01-15 NOTE — MAU Note (Signed)
Pain in lower abd and to L side for 3-4 days. Some red spotting since beginning of July. Nausea. Had pos upt at home. Hx tubal pregnancy and tube removed

## 2016-01-15 NOTE — MAU Provider Note (Signed)
Chief Complaint: Abdominal Pain and Nausea   First Provider Initiated Contact with Patient 01/15/16 0235        SUBJECTIVE   Joann King is a 25 y.o. [id number] at [redacted]w[redacted]d by LMP who presents to maternity admissions reporting Left lower abdominal pain for 3-4 days. Also has some spotting for the past 2 weeks. Also has nausea.  History is remarkable for ectopic pregnancy with removal of tube on left. She denies vaginal bleeding, vaginal itching/burning, urinary symptoms, h/a, dizziness, n/v, or fever/chills.    Abdominal Pain This is a new problem. The current episode started in the past 7 days. The onset quality is gradual. The problem occurs intermittently. The problem has been unchanged. The pain is located in the suprapubic region. The pain is mild. The quality of the pain is cramping. The abdominal pain does not radiate. Associated symptoms include nausea. Pertinent negatives include no constipation, diarrhea, dysuria, fever, frequency, headaches, myalgias or vomiting. Nothing aggravates the pain. The pain is relieved by nothing. She has tried nothing for the symptoms.   RN Note: Pain in lower abd and to L side for 3-4 days. Some red spotting since beginning of July. Nausea. Had pos upt at home. Hx tubal pregnancy and tube removed   Past Medical History  Diagnosis Date  . Herpes genitalia     doesn't think ever had outbreak  . Anemia     during first pregnancy  . Anemia   . Heartburn in pregnancy    Past Surgical History  Procedure Laterality Date  . Cesarean section  2009  . Cesarean section N/A 10/07/2013    Procedure: CESAREAN SECTION;  Surgeon: Lesly Dukes, MD;  Location: [facility];  Service: Obstetrics;  Laterality: N/A;  . Diagnostic laparoscopy with removal of ectopic pregnancy Left 08/02/2015    Procedure: DIAGNOSTIC LAPAROSCOPY WITH REMOVAL OF ECTOPIC PREGNANCY;  Surgeon: Catalina Antigua, MD;  Location: [facility];  Service: Gynecology;  Laterality: Left;  for ectopic    Social  History   Social History  . Marital Status: Single    Spouse Name: N/A  . Number of Children: N/A  . Years of Education: N/A   Occupational History  . Not on file.   Social History Main Topics  . Smoking status: Former Smoker -- 0.50 packs/day    Types: Cigarettes  . Smokeless tobacco: Never Used  . Alcohol Use: Yes  . Drug Use: No  . Sexual Activity: Yes    Birth Control/ Protection: Condom     Comment: pregnant   Other Topics Concern  . Not on file   Social History Narrative   No current facility-administered medications on file prior to encounter.   Current Outpatient Prescriptions on File Prior to Encounter  Medication Sig Dispense Refill  . ibuprofen (ADVIL,MOTRIN) 600 MG tablet Take 1 tablet (600 mg total) by mouth every 6 (six) hours as needed. 30 tablet 1   No Known Allergies  I have reviewed patient's Past Medical Hx, Surgical Hx, Family Hx, Social Hx, medications and allergies.   ROS:  Review of Systems  Constitutional: Negative for fever.  Gastrointestinal: Positive for nausea and abdominal pain. Negative for vomiting, diarrhea and constipation.  Genitourinary: Negative for dysuria and frequency.  Musculoskeletal: Negative for myalgias.  Neurological: Negative for headaches.    Physical Exam  Patient Vitals for the past 24 hrs:  BP Temp Pulse Resp Height Weight  01/15/16 0139 133/67 mmHg 98.1 F (36.7 C) 78 18 5\' 6"  (1.676 m)  205 lb (92.987 kg)   Physical Exam  Constitutional: Well-developed, well-nourished female in no acute distress.  Cardiovascular: normal rate Respiratory: normal effort GI: Abd soft, non-tender. Pos BS x 4 MS: Extremities nontender, no edema, normal ROM Neurologic: Alert and oriented x 4.  GU: Neg CVAT.  PELVIC EXAM: Cervix pink, visually closed, without lesion, scant white creamy discharge, vaginal walls and external genitalia normal Bimanual exam: Cervix 0/long/high, firm, anterior, neg CMT, uterus nontender, nonenlarged,  adnexa without tenderness, enlargement, or mass   LAB RESULTS Results for orders placed or performed during the hospital encounter of 01/15/16 (from the past 72 hour(s))  GC/Chlamydia probe amp ([place])[facility]     Status: None   Collection Time: 01/15/16 12:00 AM  Result Value Ref Range   Chlamydia Negative     Comment: Normal Reference Range - Negative   Neisseria gonorrhea Negative     Comment: Normal Reference Range - Negative  Urinalysis, Routine w reflex microscopic (not [facility])     Status: Abnormal   Collection Time: 01/15/16  1:45 AM  Result Value Ref Range   Color, Urine YELLOW YELLOW   APPearance CLEAR CLEAR   Specific Gravity, Urine <1.005 (L) 1.005 - 1.030   pH 6.0 5.0 - 8.0   Glucose, UA NEGATIVE NEGATIVE mg/dL   Hgb urine dipstick NEGATIVE NEGATIVE   Bilirubin Urine NEGATIVE NEGATIVE   Ketones, ur NEGATIVE NEGATIVE mg/dL   Protein, ur NEGATIVE NEGATIVE mg/dL   Nitrite NEGATIVE NEGATIVE   Leukocytes, UA TRACE (A) NEGATIVE  Urine microscopic-add on     Status: Abnormal   Collection Time: 01/15/16  1:45 AM  Result Value Ref Range   Squamous Epithelial / LPF 0-5 (A) NONE SEEN   WBC, UA 0-5 0 - 5 WBC/hpf   RBC / HPF NONE SEEN 0 - 5 RBC/hpf   Bacteria, UA RARE (A) NONE SEEN   Trichomonas, UA PRESENT   Pregnancy, urine POC     Status: Abnormal   Collection Time: 01/15/16  2:02 AM  Result Value Ref Range   Preg Test, Ur POSITIVE (A) NEGATIVE    Comment:        THE SENSITIVITY OF THIS METHODOLOGY IS >24 mIU/mL   CBC     Status: Abnormal   Collection Time: 01/15/16  2:49 AM  Result Value Ref Range   WBC 6.7 4.0 - 10.5 K/uL   RBC 3.78 (L) 3.87 - 5.11 MIL/uL   Hemoglobin 11.7 (L) 12.0 - 15.0 g/dL   HCT 21.332.9 (L) 08.636.0 - 57.846.0 %   MCV 87.0 78.0 - 100.0 fL   MCH 31.0 26.0 - 34.0 pg   MCHC 35.6 30.0 - 36.0 g/dL   RDW 46.913.7 62.911.5 - 52.815.5 %   Platelets 312 150 - 400 K/uL  hCG, quantitative, pregnancy     Status: Abnormal   Collection Time: 01/15/16  2:49 AM   Result Value Ref Range   hCG, Beta Chain, Quant, S 28721 (H) <5 mIU/mL    Comment:          GEST. AGE      CONC.  (mIU/mL)   <=1 WEEK        5 - 50     2 WEEKS       50 - 500     3 WEEKS       100 - 10,000     4 WEEKS     1,000 - 30,000     5 WEEKS  3,500 - 115,000   6-8 WEEKS     12,000 - 270,000    12 WEEKS     15,000 - 220,000        FEMALE AND NON-PREGNANT FEMALE:     LESS THAN 5 mIU/mL   HIV antibody (routine testing) (NOT for [facility])     Status: None   Collection Time: 01/15/16  2:49 AM  Result Value Ref Range   HIV Screen 4th Generation wRfx Non Reactive Non Reactive    Comment: (NOTE) Performed At: [facility] [address] [place], [place] [id number] Mila Homer MD JY:[id number]   Wet prep, genital     Status: Abnormal   Collection Time: 01/15/16  3:02 AM  Result Value Ref Range   Yeast Wet Prep HPF POC NONE SEEN NONE SEEN   Trich, Wet Prep NONE SEEN NONE SEEN   Clue Cells Wet Prep HPF POC PRESENT (A) NONE SEEN   WBC, Wet Prep HPF POC NONE SEEN NONE SEEN    Comment: MODERATE BACTERIA SEEN   Sperm NONE SEEN     --/--/O POS (01/30 1108)  IMAGING US Ob Comp Less 14 Wks  01/15/2016  CLINICAL DATA:  Left abdominal pain for 4 days. Positive urine pregnancy test. Previous left salpingectomy for ectopic pregnancy. Estimated gestational age by LMP is 6 weeks 0 days. Quantitative beta HCG is pending. EXAM: OBSTETRIC <14 WK [place] AND TRANSVAGINAL OB US TECHNIQUE: Both transabdominal and transvaginal ultrasound examinations were performed for complete evaluation of the gestation as well as the maternal uterus, adnexal regions, and pelvic cul-de-sac. Transvaginal technique was performed to assess early pregnancy. COMPARISON:  None. FINDINGS: Intrauterine gestational sac: Single intrauterine gestational sac is identified. Yolk sac:  Yolk sac is visualized. Embryo:  Not identified. Cardiac Activity: Not identified. MSD: 12.5  mm   6 w   1  d Subchorionic hemorrhage:   None visualized. Maternal uterus/adnexae: Uterus is retroflexed. No myometrial mass lesions. Both ovaries are identified and appear normal. Corpus luteum cyst on the right. No abnormal adnexal masses. No free fluid in the pelvis. Small nabothian cysts in the cervix. IMPRESSION: Probable early intrauterine gestational sac with yolk sac, but no fetal pole or cardiac activity yet visualized. Recommend follow-up quantitative B-HCG levels and follow-up US in 14 days to confirm and assess viability. This recommendation follows SRU consensus guidelines: Diagnostic Criteria for Nonviable Pregnancy Early in the First Trimester. Malva Limes Med 2013; 865:7846-96. Electronically Signed   By: Burman Nieves M.D.   On: 01/15/2016 04:01   US Ob Transvaginal  01/15/2016  CLINICAL DATA:  Left abdominal pain for 4 days. Positive urine pregnancy test. Previous left salpingectomy for ectopic pregnancy. Estimated gestational age by LMP is 6 weeks 0 days. Quantitative beta HCG is pending. EXAM: OBSTETRIC <14 WK [place] AND TRANSVAGINAL OB US TECHNIQUE: Both transabdominal and transvaginal ultrasound examinations were performed for complete evaluation of the gestation as well as the maternal uterus, adnexal regions, and pelvic cul-de-sac. Transvaginal technique was performed to assess early pregnancy. COMPARISON:  None. FINDINGS: Intrauterine gestational sac: Single intrauterine gestational sac is identified. Yolk sac:  Yolk sac is visualized. Embryo:  Not identified. Cardiac Activity: Not identified. MSD: 12.5  mm   6 w   1  d Subchorionic hemorrhage:  None visualized. Maternal uterus/adnexae: Uterus is retroflexed. No myometrial mass lesions. Both ovaries are identified and appear normal. Corpus luteum cyst on the right. No abnormal adnexal masses. No free fluid in the pelvis. Small nabothian  cysts in the cervix. IMPRESSION: Probable early intrauterine gestational sac with yolk sac, but no fetal pole or cardiac activity yet visualized.  Recommend follow-up quantitative B-HCG levels and follow-up US in 14 days to confirm and assess viability. This recommendation follows SRU consensus guidelines: Diagnostic Criteria for Nonviable Pregnancy Early in the First Trimester. Malva Limes Med 2013; 409:8119-14. Electronically Signed   By: Burman Nieves M.D.   On: 01/15/2016 04:01    MAU Management/MDM: Ordered usual first trimester r/o ectopic labs.   Pelvic exam and cultures done Will check baseline Ultrasound to rule out ectopic.  Blood drawn for Quant HCG, CBC, ABO/Rh   This bleeding/pain can represent a normal pregnancy with bleeding, spontaneous abortion or even an ectopic which can be life-threatening.  The process as listed above helps to determine which of these is present.    ASSESSMENT 1. Abdominal pain in pregnancy   2.     SIUP at [redacted]w[redacted]d  3.    Nausea of pregnancy  PLAN Discharge home Rx Diclegis for nausea Recommend she start prenatal care as scheduled     Medication List    ASK your doctor about these medications        ibuprofen 600 MG tablet  Commonly known as:  ADVIL,MOTRIN  Take 1 tablet (600 mg total) by mouth every 6 (six) hours as needed.        Pt stable at time of discharge. Encouraged to return here or to other Urgent Care/ED if she develops worsening of symptoms, increase in pain, fever, or other concerning symptoms.    Wynelle Bourgeois CNM, MSN Certified Nurse-Midwife 01/15/2016  2:35 AM

## 2016-01-17 LAB — GC/CHLAMYDIA PROBE AMP (~~LOC~~) NOT AT ARMC
CHLAMYDIA, DNA PROBE: NEGATIVE
NEISSERIA GONORRHEA: NEGATIVE

## 2016-02-05 ENCOUNTER — Inpatient Hospital Stay (HOSPITAL_COMMUNITY)
Admission: AD | Admit: 2016-02-05 | Discharge: 2016-02-05 | Disposition: A | Discharge status: Home / Self Care | Payer: Medicaid Other | Source: Ambulatory Visit | Attending: Obstetrics & Gynecology | Admitting: Obstetrics & Gynecology

## 2016-02-05 ENCOUNTER — Inpatient Hospital Stay (HOSPITAL_COMMUNITY): Payer: Medicaid Other

## 2016-02-05 ENCOUNTER — Encounter (HOSPITAL_COMMUNITY): Payer: Self-pay | Admitting: *Deleted

## 2016-02-05 DIAGNOSIS — O219 Vomiting of pregnancy, unspecified: Secondary | ICD-10-CM

## 2016-02-05 DIAGNOSIS — Z3A09 9 weeks gestation of pregnancy: Secondary | ICD-10-CM | POA: Insufficient documentation

## 2016-02-05 DIAGNOSIS — O418X10 Other specified disorders of amniotic fluid and membranes, first trimester, not applicable or unspecified: Secondary | ICD-10-CM

## 2016-02-05 DIAGNOSIS — Z87891 Personal history of nicotine dependence: Secondary | ICD-10-CM | POA: Diagnosis not present

## 2016-02-05 DIAGNOSIS — R109 Unspecified abdominal pain: Secondary | ICD-10-CM | POA: Diagnosis not present

## 2016-02-05 DIAGNOSIS — O26891 Other specified pregnancy related conditions, first trimester: Secondary | ICD-10-CM | POA: Diagnosis not present

## 2016-02-05 DIAGNOSIS — O208 Other hemorrhage in early pregnancy: Secondary | ICD-10-CM | POA: Insufficient documentation

## 2016-02-05 DIAGNOSIS — O209 Hemorrhage in early pregnancy, unspecified: Secondary | ICD-10-CM

## 2016-02-05 DIAGNOSIS — O468X1 Other antepartum hemorrhage, first trimester: Secondary | ICD-10-CM

## 2016-02-05 DIAGNOSIS — N939 Abnormal uterine and vaginal bleeding, unspecified: Secondary | ICD-10-CM | POA: Diagnosis present

## 2016-02-05 LAB — CBC WITH DIFFERENTIAL/PLATELET
BASOS PCT: 0 %
Basophils Absolute: 0 10*3/uL (ref 0.0–0.1)
EOS ABS: 0.2 10*3/uL (ref 0.0–0.7)
Eosinophils Relative: 2 %
HCT: 32.3 % — ABNORMAL LOW (ref 36.0–46.0)
HEMOGLOBIN: 11.5 g/dL — AB (ref 12.0–15.0)
LYMPHS ABS: 3 10*3/uL (ref 0.7–4.0)
Lymphocytes Relative: 38 %
MCH: 30.6 pg (ref 26.0–34.0)
MCHC: 35.6 g/dL (ref 30.0–36.0)
MCV: 85.9 fL (ref 78.0–100.0)
Monocytes Absolute: 0.4 10*3/uL (ref 0.1–1.0)
Monocytes Relative: 5 %
NEUTROS ABS: 4.3 10*3/uL (ref 1.7–7.7)
NEUTROS PCT: 55 %
Platelets: 334 10*3/uL (ref 150–400)
RBC: 3.76 MIL/uL — AB (ref 3.87–5.11)
RDW: 13.5 % (ref 11.5–15.5)
WBC: 7.8 10*3/uL (ref 4.0–10.5)

## 2016-02-05 LAB — URINE MICROSCOPIC-ADD ON

## 2016-02-05 LAB — URINALYSIS, ROUTINE W REFLEX MICROSCOPIC
BILIRUBIN URINE: NEGATIVE
Glucose, UA: 100 mg/dL — AB
Ketones, ur: NEGATIVE mg/dL
NITRITE: NEGATIVE
Protein, ur: 30 mg/dL — AB
pH: 6 (ref 5.0–8.0)

## 2016-02-05 MED ORDER — PROMETHAZINE HCL 12.5 MG PO TABS: 12.5000 mg | ORAL_TABLET | Freq: Four times a day (QID) | ORAL | 0 refills | Status: DC | PRN

## 2016-02-05 NOTE — MAU Note (Signed)
My panties are full of blood, having some pain in my lower back and occ sharp pain RLQ of stomach. Symptoms started at 2300.

## 2016-02-05 NOTE — Discharge Instructions (Signed)
Morning Sickness Morning sickness is when you feel sick to your stomach (nauseous) during pregnancy. This nauseous feeling may or may not come with vomiting. It often occurs in the morning but can be a problem any time of day. Morning sickness is most common during the first trimester, but it may continue throughout pregnancy. While morning sickness is unpleasant, it is usually harmless unless you develop severe and continual vomiting (hyperemesis gravidarum). This condition requires more intense treatment.  CAUSES  The cause of morning sickness is not completely known but seems to be related to normal hormonal changes that occur in pregnancy. RISK FACTORS You are at greater risk if you:  Experienced nausea or vomiting before your pregnancy.  Had morning sickness during a previous pregnancy.  Are pregnant with more than one baby, such as twins. TREATMENT  Do not use any medicines (prescription, over-the-counter, or herbal) for morning sickness without first talking to your health care provider. Your health care provider may prescribe or recommend:  Vitamin B6 supplements.  Anti-nausea medicines.  The herbal medicine ginger. HOME CARE INSTRUCTIONS   Only take over-the-counter or prescription medicines as directed by your health care provider.  Taking multivitamins before getting pregnant can prevent or decrease the severity of morning sickness in most women.  Eat a piece of dry toast or unsalted crackers before getting out of bed in the morning.  Eat five or six small meals a day.  Eat dry and bland foods (rice, baked potato). Foods high in carbohydrates are often helpful.  Do not drink liquids with your meals. Drink liquids between meals.  Avoid greasy, fatty, and spicy foods.  Get someone to cook for you if the smell of any food causes nausea and vomiting.  If you feel nauseous after taking prenatal vitamins, take the vitamins at night or with a snack.  Snack on protein  foods (nuts, yogurt, cheese) between meals if you are hungry.  Eat unsweetened gelatins for desserts.  Wearing an acupressure wristband (worn for sea sickness) may be helpful.  Acupuncture may be helpful.  Do not smoke.  Get a humidifier to keep the air in your house free of odors.  Get plenty of fresh air. SEEK MEDICAL CARE IF:   Your home remedies are not working, and you need medicine.  You feel dizzy or lightheaded.  You are losing weight. SEEK IMMEDIATE MEDICAL CARE IF:   You have persistent and uncontrolled nausea and vomiting.  You pass out (faint). MAKE SURE YOU:  Understand these instructions.  Will watch your condition.  Will get help right away if you are not doing well or get worse.   This information is not intended to replace advice given to you by your health care provider. Make sure you discuss any questions you have with your health care provider.   Document Released: 08/10/2006 Document Revised: 06/24/2013 Document Reviewed: 12/04/2012 Elsevier Interactive Patient Education 2016 Elsevier Inc.  Pelvic Rest Pelvic rest is sometimes recommended for women when:   The placenta is partially or completely covering the opening of the cervix (placenta previa).  There is bleeding between the uterine wall and the amniotic sac in the first trimester (subchorionic hemorrhage).  The cervix begins to open without labor starting (incompetent cervix, cervical insufficiency).  The labor is too early (preterm labor). HOME CARE INSTRUCTIONS  Do not have sexual intercourse, stimulation, or an orgasm.  Do not use tampons, douche, or put anything in the vagina.  Do not lift anything over 10 pounds (4.5  kg).  Avoid strenuous activity or straining your pelvic muscles. SEEK MEDICAL CARE IF:  You have any vaginal bleeding during pregnancy. Treat this as a potential emergency.  You have cramping pain felt low in the stomach (stronger than menstrual cramps).  You  notice vaginal discharge (watery, mucus, or bloody).  You have a low, dull backache.  There are regular contractions or uterine tightening. SEEK IMMEDIATE MEDICAL CARE IF: You have vaginal bleeding and have placenta previa.    This information is not intended to replace advice given to you by your health care provider. Make sure you discuss any questions you have with your health care provider.   Document Released: 10/14/2010 Document Revised: 09/11/2011 Document Reviewed: 12/21/2014 Elsevier Interactive Patient Education 2016 [employer]. Eating Plan for N/V in pregnancy Severe cases of hyperemesis gravidarum can lead to dehydration and malnutrition. The hyperemesis eating plan is one way to lessen the symptoms of nausea and vomiting. It is often used with prescribed medicines to control your symptoms.  WHAT CAN I DO TO RELIEVE MY SYMPTOMS? Listen to your body. Everyone is different and has different preferences. Find what works best for you. Some of the following things may help:  Eat and drink slowly.  Eat 5-6 small meals daily instead of 3 large meals.   Eat crackers before you get out of bed in the morning.   Starchy foods are usually well tolerated (such as cereal, toast, bread, potatoes, pasta, rice, and pretzels).   Ginger may help with nausea. Add  tsp ground ginger to hot tea or choose ginger tea.   Try drinking 100% fruit juice or an electrolyte drink.  Continue to take your prenatal vitamins as directed by your health care provider. If you are having trouble taking your prenatal vitamins, talk with your health care provider about different options.  Include at least 1 serving of protein with your meals and snacks (such as meats or poultry, beans, nuts, eggs, or yogurt). Try eating a protein-rich snack before bed (such as cheese and crackers or a half [place] or peanut butter sandwich). WHAT THINGS SHOULD I AVOID TO REDUCE MY SYMPTOMS? The following things may help  reduce your symptoms:  Avoid foods with strong smells. Try eating meals in well-ventilated areas that are free of odors.  Avoid drinking water or other beverages with meals. Try not to drink anything less than 30 minutes before and after meals.  Avoid drinking more than 1 cup of fluid at a time.  Avoid fried or high-fat foods, such as butter and cream sauces.  Avoid spicy foods.  Avoid skipping meals the best you can. Nausea can be more intense on an empty stomach. If you cannot tolerate food at that time, do not force it. Try sucking on ice chips or other frozen items and make up the calories later.  Avoid lying down within 2 hours after eating.   This information is not intended to replace advice given to you by your health care provider. Make sure you discuss any questions you have with your health care provider.   Document Released: 04/16/2007 Document Revised: 06/24/2013 Document Reviewed: 04/23/2013 Elsevier Interactive Patient Education [employer]

## 2016-02-05 NOTE — MAU Provider Note (Signed)
History     CSN: [id number]  Arrival date and time: 02/05/16 4098   First Provider Initiated Contact with Patient 02/05/16 0112      Chief Complaint  Patient presents with  . Vaginal Bleeding  . Abdominal Pain  . Back Pain   HPI Joann King is a 25 y.o. [phone] at [redacted]w[redacted]d who presents to MAU today with complaint of vaginal bleeding that started tonight. Initial episode soaked her underwear, but when she used the bathroom upon arrival in MAU it was just spotting on the tissue. She states low back pain and occasional sharp RLQ abdominal pains. She rates pain at 6/10 now. She has not taken anything for pain. She denies vaginal discharge, fever or recent intercourse. She has had frequent N/V and does not have any medications for this. She was seen in MAU 01/15/16 and had [place] that showed IUGS and YS without FP and no [facility] noted at that time.    OB History    Gravida Para Term Preterm AB Living   SAB TAB Ectopic Multiple Live Births       1   2      Past Medical History:  Diagnosis Date  . Anemia    during first pregnancy  . Anemia   . Heartburn in pregnancy   . Herpes genitalia    doesn't think ever had outbreak    Past Surgical History:  Procedure Laterality Date  . CESAREAN SECTION  2009  . CESAREAN SECTION N/A 10/07/2013   Procedure: CESAREAN SECTION;  Surgeon: Lesly Dukes, MD;  Location: [facility];  Service: Obstetrics;  Laterality: N/A;  . DIAGNOSTIC LAPAROSCOPY WITH REMOVAL OF ECTOPIC PREGNANCY Left 08/02/2015   Procedure: DIAGNOSTIC LAPAROSCOPY WITH REMOVAL OF ECTOPIC PREGNANCY;  Surgeon: Catalina Antigua, MD;  Location: [facility];  Service: Gynecology;  Laterality: Left;  for ectopic     Family History  Problem Relation Age of Onset  . Diabetes Maternal Grandmother   . Hypertension Maternal Grandmother     Social History  Substance Use Topics  . Smoking status: Former Smoker    Packs/day: 0.50    Types: Cigarettes  . Smokeless tobacco: Never Used   . Alcohol use Yes    Allergies: No Known Allergies  Prescriptions Prior to Admission  Medication Sig Dispense Refill Last Dose  . Doxylamine-Pyridoxine (DICLEGIS) 10-10 MG TBEC Take 1 tablet by mouth at bedtime as needed. 100 tablet 1   . ibuprofen (ADVIL,MOTRIN) 600 MG tablet Take 1 tablet (600 mg total) by mouth every 6 (six) hours as needed. 30 tablet 1 More than a month at Unknown time    Review of Systems  Constitutional: Negative for fever and malaise/fatigue.  Gastrointestinal: Positive for abdominal pain, nausea and vomiting. Negative for constipation and diarrhea.  Genitourinary:       + vaginal bleeding Neg - vaginal discharge   Physical Exam   Blood pressure 139/77, pulse 90, temperature 98.1 F (36.7 C), resp. rate 18, height  (1.676 m), weight 203 lb 6.4 oz (92.3 kg), last menstrual period 12/04/2015.  Physical Exam  Nursing note and vitals reviewed. Constitutional: She is oriented to person, place, and time. She appears well-developed and well-nourished. No distress.  HENT:  Head: Normocephalic and atraumatic.  Cardiovascular: Normal rate.   Respiratory: Effort normal.  GI: Soft. She exhibits no distension and no mass. There is tenderness (mild lower abdominal tenderness to palpation). There is no  rebound and no guarding.  Genitourinary: Uterus is enlarged (slightly) and tender (mild). Cervix exhibits no motion tenderness, no discharge and no friability. Right adnexum displays no mass and no tenderness. Left adnexum displays no mass and no tenderness. There is bleeding (scant blood noted) in the vagina. No vaginal discharge found.  Neurological: She is alert and oriented to person, place, and time.  Skin: Skin is warm and dry. No erythema.  Psychiatric: She has a normal mood and affect.  Dilation: Closed Effacement (%): Thick Cervical Position: Posterior  Results for orders placed or performed during the hospital encounter of 02/05/16 (from the past 24  hour(s))  Urinalysis, Routine w reflex microscopic (not at [facility])     Status: Abnormal   Collection Time: 02/05/16  1:00 AM  Result Value Ref Range   Color, Urine YELLOW YELLOW   APPearance CLEAR CLEAR   Specific Gravity, Urine >1.030 (H) 1.005 - 1.030   pH 6.0 5.0 - 8.0   Glucose, UA 100 (A) NEGATIVE mg/dL   Hgb urine dipstick LARGE (A) NEGATIVE   Bilirubin Urine NEGATIVE NEGATIVE   Ketones, ur NEGATIVE NEGATIVE mg/dL   Protein, ur 30 (A) NEGATIVE mg/dL   Nitrite NEGATIVE NEGATIVE   Leukocytes, UA TRACE (A) NEGATIVE  Urine microscopic-add on     Status: Abnormal   Collection Time: 02/05/16  1:00 AM  Result Value Ref Range   Squamous Epithelial / LPF 0-5 (A) NONE SEEN   WBC, UA 0-5 0 - 5 WBC/hpf   RBC / HPF 6-30 0 - 5 RBC/hpf   Bacteria, UA FEW (A) NONE SEEN  CBC with Differential/Platelet     Status: Abnormal   Collection Time: 02/05/16  1:06 AM  Result Value Ref Range   WBC 7.8 4.0 - 10.5 K/uL   RBC 3.76 (L) 3.87 - 5.11 MIL/uL   Hemoglobin 11.5 (L) 12.0 - 15.0 g/dL   HCT 42.3 (L) 53.6 - 14.4 %   MCV 85.9 78.0 - 100.0 fL   MCH 30.6 26.0 - 34.0 pg   MCHC 35.6 30.0 - 36.0 g/dL   RDW 31.5 40.0 - 86.7 %   Platelets 334 150 - 400 K/uL   Neutrophils Relative % 55 %   Neutro Abs 4.3 1.7 - 7.7 K/uL   Lymphocytes Relative 38 %   Lymphs Abs 3.0 0.7 - 4.0 K/uL   Monocytes Relative 5 %   Monocytes Absolute 0.4 0.1 - 1.0 K/uL   Eosinophils Relative 2 %   Eosinophils Absolute 0.2 0.0 - 0.7 K/uL   Basophils Relative 0 %   Basophils Absolute 0.0 0.0 - 0.1 K/uL    US Ob Transvaginal  Result Date: 02/05/2016 CLINICAL DATA:  Pregnant patient in first-trimester pregnancy with vaginal bleeding and right lower quadrant pain. Back pain. Symptoms today. EXAM: TRANSVAGINAL OB ULTRASOUND TECHNIQUE: Transvaginal ultrasound was performed for complete evaluation of the gestation as well as the maternal uterus, adnexal regions, and pelvic cul-de-sac. COMPARISON:  Obstetric ultrasound 01/15/2016  FINDINGS: Intrauterine gestational sac: Single Yolk sac:  Present. Embryo:  Present. Cardiac Activity: Present. Heart Rate: 167 bpm CRL:   22.7  mm   9 w 0 d                  [place] EDC: 09/09/2016 Subchorionic hemorrhage: Small inferiorly measuring 3.0 x 1.4 x 3.0 cm Maternal uterus/adnexae: No adnexal mass. Neither ovary is identified. No pelvic free fluid. IMPRESSION: Single live intrauterine pregnancy estimated gestational age [redacted] weeks 0 days for estimated  date of delivery 09/09/2016. Small subchorionic hemorrhage. Electronically Signed   By: Rubye Oaks M.D.   On: 02/05/2016 01:41    MAU Course  Procedures None  MDM Reviewed [place] results from last visit CBC and [place] today   Assessment and Plan  A: SIUP at [redacted]w[redacted]d Small subchorionic hemorrhage Vaginal bleeding in pregnancy prior to [redacted] weeks gestation  Abdominal pain in pregnancy, first trimester Nausea and vomiting in pregnancy prior to [redacted] weeks gestation   P: Discharge home Rx for Phenergan given to patient Also advised previously given Diclegis Bleeding precautions and pelvic rest discussed Patient advised to follow-up with OB provider of choice to start prenatal care Patient may return to MAU as needed or if her condition were to change or worsen   Marny Lowenstein, PA-C  02/05/2016, 1:53 AM

## 2016-03-20 LAB — CYTOLOGY - PAP
Glucose 1 Hr Prenatal, POC: 105 mg/dL
Maternal quad screen for MFM: NEGATIVE
Pap: NEGATIVE
Urine Culture, OB: NEGATIVE

## 2016-03-20 LAB — OB RESULTS CONSOLE ABO/RH: RH TYPE: POSITIVE

## 2016-03-20 LAB — OB RESULTS CONSOLE HEPATITIS B SURFACE ANTIGEN: Hepatitis B Surface Ag: NEGATIVE

## 2016-03-20 LAB — OB RESULTS CONSOLE VARICELLA ZOSTER ANTIBODY, IGG: Varicella: IMMUNE

## 2016-03-20 LAB — OB RESULTS CONSOLE ANTIBODY SCREEN: ANTIBODY SCREEN: NEGATIVE

## 2016-03-20 LAB — OB RESULTS CONSOLE RUBELLA ANTIBODY, IGM: Rubella: IMMUNE

## 2016-03-20 LAB — OB RESULTS CONSOLE RPR: RPR: NONREACTIVE

## 2016-06-07 ENCOUNTER — Inpatient Hospital Stay (HOSPITAL_COMMUNITY)
Admission: AD | Admit: 2016-06-07 | Discharge: 2016-06-08 | Disposition: A | Discharge status: Home / Self Care | Payer: Medicaid Other | Source: Ambulatory Visit | Attending: Obstetrics and Gynecology | Admitting: Obstetrics and Gynecology

## 2016-06-07 ENCOUNTER — Encounter (HOSPITAL_COMMUNITY): Payer: Self-pay

## 2016-06-07 DIAGNOSIS — R12 Heartburn: Secondary | ICD-10-CM | POA: Insufficient documentation

## 2016-06-07 DIAGNOSIS — B009 Herpesviral infection, unspecified: Secondary | ICD-10-CM | POA: Insufficient documentation

## 2016-06-07 DIAGNOSIS — O98512 Other viral diseases complicating pregnancy, second trimester: Secondary | ICD-10-CM | POA: Insufficient documentation

## 2016-06-07 DIAGNOSIS — K5900 Constipation, unspecified: Secondary | ICD-10-CM

## 2016-06-07 DIAGNOSIS — Z3A26 26 weeks gestation of pregnancy: Secondary | ICD-10-CM | POA: Insufficient documentation

## 2016-06-07 DIAGNOSIS — Z87891 Personal history of nicotine dependence: Secondary | ICD-10-CM | POA: Insufficient documentation

## 2016-06-07 DIAGNOSIS — O99012 Anemia complicating pregnancy, second trimester: Secondary | ICD-10-CM | POA: Insufficient documentation

## 2016-06-07 DIAGNOSIS — O99612 Diseases of the digestive system complicating pregnancy, second trimester: Secondary | ICD-10-CM

## 2016-06-07 LAB — URINALYSIS, ROUTINE W REFLEX MICROSCOPIC
BILIRUBIN URINE: NEGATIVE
GLUCOSE, UA: NEGATIVE mg/dL
Hgb urine dipstick: NEGATIVE
Ketones, ur: NEGATIVE mg/dL
NITRITE: NEGATIVE
PH: 5 (ref 5.0–8.0)
Protein, ur: 30 mg/dL — AB
SPECIFIC GRAVITY, URINE: 1.025 (ref 1.005–1.030)

## 2016-06-07 NOTE — MAU Note (Signed)
Patient presents with c/o constipation. Patient states that she has not had a bowel movement for the past 2 days. Patient denies any vaginal bleeeding. Or LOF. Fetus active.

## 2016-06-07 NOTE — MAU Provider Note (Signed)
Chief Complaint:  Constipation  HPI: Joann King is a 25 y.o. [id number] at 5924w4d who presents to maternity admissions reporting constipation. Scant blood on toilet paper when wiping.  Last regular BM 2 days ago.  Tried to go this morning and couldn't.  Had to strain. No constipation prior to 2 days ago.  No history of hemorrhoids, per patient.  Had a doctor's appointment today, told them about it but did not get anything for it.  Has not tried anything for it.  Denies abdominal pain.  Is taking her prenatal vitamins now and then but states they make her sick.  Denies urinary symptoms, no frequency, urgency or burning.   Denies contractions, leakage of fluid or vaginal bleeding. Good fetal movement.    Pregnancy Course:  Hx of Trichomonas diagnosed last month, treated today in clinic.  Past Medical History: Past Medical History:  Diagnosis Date  . Anemia    during first pregnancy  . Anemia   . Heartburn in pregnancy   . Herpes genitalia    doesn't think ever had outbreak   Past obstetric history: OB History  Gravida Para Term Preterm AB Living  5 2 2   2 2   SAB TAB Ectopic Multiple Live Births      1   2    # Outcome Date GA Lbr Len/2nd Weight Sex Delivery Anes PTL Lv  5 Current           4 Term 10/07/13 350w1d  6 lb 6.7 oz (2.91 kg) M CS-LTranv   LIV  3 Term 10/12/07 686w0d  7 lb 9 oz (3.43 kg) M CS-LTranv EPI  LIV     Birth Comments: c/section for CPD  2 Ectopic           1 AB      TAB        Past Surgical History: Past Surgical History:  Procedure Laterality Date  . CESAREAN SECTION  2009  . CESAREAN SECTION N/A 10/07/2013   Procedure: CESAREAN SECTION;  Surgeon: Lesly DukesKelly H Leggett, MD;  Location: [facility];  Service: Obstetrics;  Laterality: N/A;  . DIAGNOSTIC LAPAROSCOPY WITH REMOVAL OF ECTOPIC PREGNANCY Left 08/02/2015   Procedure: DIAGNOSTIC LAPAROSCOPY WITH REMOVAL OF ECTOPIC PREGNANCY;  Surgeon: Catalina AntiguaPeggy Constant, MD;  Location: [facility];  Service: Gynecology;  Laterality: Left;   for ectopic    Family History: Family History  Problem Relation Age of Onset  . Diabetes Maternal Grandmother   . Hypertension Maternal Grandmother    Social History: Social History  Substance Use Topics  . Smoking status: Former Smoker    Packs/day: 0.50    Types: Cigarettes  . Smokeless tobacco: Never Used  . Alcohol use Yes   Allergies: No Known Allergies  Meds:  Prescriptions Prior to Admission  Medication Sig Dispense Refill Last Dose  . Doxylamine-Pyridoxine (DICLEGIS) 10-10 MG TBEC Take 1 tablet by mouth at bedtime as needed. 100 tablet 1   . promethazine (PHENERGAN) 12.5 MG tablet Take 1 tablet (12.5 mg total) by mouth every 6 (six) hours as needed for nausea or vomiting. 30 tablet 0    I have reviewed patient's Past Medical Hx, Surgical Hx, Family Hx, Social Hx, medications and allergies.   ROS:  A comprehensive ROS was negative except per HPI.   Physical Exam   Patient Vitals for the past 24 hrs:  BP Temp Temp src Pulse Resp  06/07/16 2318 130/66 98.1 F (36.7 C) Oral 89 18   Constitutional: Well-developed,  well-nourished female in no acute distress.  Cardiovascular: normal rate Respiratory: normal effort GI: Abd soft, non-tender, gravid appropriate for gestational age. Pos BS x 4 MS: Extremities nontender, no edema, normal ROM Neurologic: Alert and oriented x 4.  GU: Neg CVAT.  Pelvic: NEFG, physiologic discharge, no blood, cervix clean. No CMT   No signs of external hemorrhoids or fissures on exam  FHT:  Baseline 130s , moderate variability, accelerations present, no decelerations Contractions: None   Labs: Results for orders placed or performed during the hospital encounter of 06/07/16 (from the past 24 hour(s))  Urinalysis, Routine w reflex microscopic     Status: Abnormal   Collection Time: 06/07/16 11:10 PM  Result Value Ref Range   Color, Urine YELLOW YELLOW   APPearance CLOUDY (A) CLEAR   Specific Gravity, Urine 1.025 1.005 - 1.030   pH 5.0  5.0 - 8.0   Glucose, UA NEGATIVE NEGATIVE mg/dL   Hgb urine dipstick NEGATIVE NEGATIVE   Bilirubin Urine NEGATIVE NEGATIVE   Ketones, ur NEGATIVE NEGATIVE mg/dL   Protein, ur 30 (A) NEGATIVE mg/dL   Nitrite NEGATIVE NEGATIVE   Leukocytes, UA LARGE (A) NEGATIVE   RBC / HPF 0-5 0 - 5 RBC/hpf   WBC, UA 6-30 0 - 5 WBC/hpf   Bacteria, UA RARE (A) NONE SEEN   Squamous Epithelial / LPF TOO NUMEROUS TO COUNT (A) NONE SEEN   Mucous PRESENT    Ca Oxalate Crys, UA PRESENT    Imaging:  No results found.  MAU Course: Inspected rectum for external hemorrhoids/fissures Colace and Miralax prescribed  MDM: Plan of care reviewed with patient, including labs and tests ordered and medical treatment.  Assessment: No diagnosis found.  Plan: IUP at 1935w4d with constipation.  FHT Cat I.  No contractions.   Rectum inspected with no signs of hemorrhoids or fissures. Colace and Miralax prescribed.  Dulcolax suppository if Colace and Miralax not helping.  Advised to stay well hydrated and drink plenty of water.  Advised not to take prenatal vitamins for 1 week while taking meds as it can worsen constipation.  Discharge home in stable condition.  Return precautions discussed.    OB FELLOW DISCHARGE ATTESTATION  I have seen and examined this patient and agree with above documentation in the resident's note.   Ernestina PennaNicholas Samoria Fedorko, MD 12:24 AM     Medication List    TAKE these medications   docusate sodium 100 MG capsule Commonly known as:  COLACE Take 1 capsule (100 mg total) by mouth 2 (two) times daily.   Doxylamine-Pyridoxine 10-10 MG Tbec Commonly known as:  DICLEGIS Take 1 tablet by mouth at bedtime as needed.   polyethylene glycol packet Commonly known as:  MIRALAX / GLYCOLAX Take 17 g by mouth daily.   promethazine 12.5 MG tablet Commonly known as:  PHENERGAN Take 1 tablet (12.5 mg total) by mouth every 6 (six) hours as needed for nausea or vomiting.       Joann MarchYashika Amin,  MD PGY-1 06/08/2016 12:05 AM

## 2016-06-08 DIAGNOSIS — O99012 Anemia complicating pregnancy, second trimester: Secondary | ICD-10-CM | POA: Diagnosis not present

## 2016-06-08 DIAGNOSIS — Z87891 Personal history of nicotine dependence: Secondary | ICD-10-CM | POA: Diagnosis not present

## 2016-06-08 DIAGNOSIS — K5900 Constipation, unspecified: Secondary | ICD-10-CM | POA: Diagnosis present

## 2016-06-08 DIAGNOSIS — R12 Heartburn: Secondary | ICD-10-CM | POA: Diagnosis not present

## 2016-06-08 DIAGNOSIS — B009 Herpesviral infection, unspecified: Secondary | ICD-10-CM | POA: Diagnosis not present

## 2016-06-08 DIAGNOSIS — O99612 Diseases of the digestive system complicating pregnancy, second trimester: Secondary | ICD-10-CM | POA: Diagnosis not present

## 2016-06-08 DIAGNOSIS — O98512 Other viral diseases complicating pregnancy, second trimester: Secondary | ICD-10-CM | POA: Diagnosis not present

## 2016-06-08 DIAGNOSIS — Z3A26 26 weeks gestation of pregnancy: Secondary | ICD-10-CM | POA: Diagnosis not present

## 2016-06-08 MED ORDER — BISACODYL 10 MG RE SUPP: 10.0000 mg | RECTAL | 0 refills | Status: DC | PRN

## 2016-06-08 MED ORDER — POLYETHYLENE GLYCOL 3350 17 G PO PACK: 17.0000 g | PACK | Freq: Every day | ORAL | 0 refills | Status: DC

## 2016-06-08 MED ORDER — DOCUSATE SODIUM 100 MG PO CAPS: 100.0000 mg | ORAL_CAPSULE | Freq: Two times a day (BID) | ORAL | 0 refills | Status: DC

## 2016-06-08 NOTE — Discharge Instructions (Signed)
Constipation, Adult °Constipation is when a person: °· Poops (has a bowel movement) fewer times in a week than normal. °· Has a hard time pooping. °· Has poop that is dry, hard, or bigger than normal. ° °Follow these instructions at home: °Eating and drinking ° °· Eat foods that have a lot of fiber, such as: °? Fresh fruits and vegetables. °? Whole grains. °? Beans. °· Eat less of foods that are high in fat, low in fiber, or overly processed, such as: °? French fries. °? Hamburgers. °? Cookies. °? Candy. °? Soda. °· Drink enough fluid to keep your pee (urine) clear or pale yellow. °General instructions °· Exercise regularly or as told by your doctor. °· Go to the restroom when you feel like you need to poop. Do not hold it in. °· Take over-the-counter and prescription medicines only as told by your doctor. These include any fiber supplements. °· Do pelvic floor retraining exercises, such as: °? Doing deep breathing while relaxing your lower belly (abdomen). °? Relaxing your pelvic floor while pooping. °· Watch your condition for any changes. °· Keep all follow-up visits as told by your doctor. This is important. °Contact a doctor if: °· You have pain that gets worse. °· You have a fever. °· You have not pooped for 4 days. °· You throw up (vomit). °· You are not hungry. °· You lose weight. °· You are bleeding from the anus. °· You have thin, pencil-like poop (stool). °Get help right away if: °· You have a fever, and your symptoms suddenly get worse. °· You leak poop or have blood in your poop. °· Your belly feels hard or bigger than normal (is bloated). °· You have very bad belly pain. °· You feel dizzy or you faint. °This information is not intended to replace advice given to you by your health care provider. Make sure you discuss any questions you have with your health care provider. °Document Released: 12/06/2007 Document Revised: 01/07/2016 Document Reviewed: 12/08/2015 °Elsevier Interactive Patient Education ©  2017 [employer] ° °

## 2016-06-30 LAB — GLUCOSE, 1 HOUR: GLUCOSE 1 HR PRENATAL, POC: 88 mg/dL

## 2016-06-30 LAB — OB RESULTS CONSOLE HIV ANTIBODY (ROUTINE TESTING): HIV: NONREACTIVE

## 2016-07-08 ENCOUNTER — Inpatient Hospital Stay (HOSPITAL_COMMUNITY)
Admission: AD | Admit: 2016-07-08 | Discharge: 2016-07-08 | Disposition: A | Discharge status: Home / Self Care | Payer: Medicaid Other | Source: Ambulatory Visit | Attending: Obstetrics and Gynecology | Admitting: Obstetrics and Gynecology

## 2016-07-08 DIAGNOSIS — K2100 Gastro-esophageal reflux disease with esophagitis, without bleeding: Secondary | ICD-10-CM

## 2016-07-08 DIAGNOSIS — Z3689 Encounter for other specified antenatal screening: Secondary | ICD-10-CM

## 2016-07-08 DIAGNOSIS — Z3A31 31 weeks gestation of pregnancy: Secondary | ICD-10-CM | POA: Insufficient documentation

## 2016-07-08 DIAGNOSIS — R030 Elevated blood-pressure reading, without diagnosis of hypertension: Secondary | ICD-10-CM

## 2016-07-08 DIAGNOSIS — O9989 Other specified diseases and conditions complicating pregnancy, childbirth and the puerperium: Secondary | ICD-10-CM

## 2016-07-08 DIAGNOSIS — Z87891 Personal history of nicotine dependence: Secondary | ICD-10-CM | POA: Diagnosis not present

## 2016-07-08 DIAGNOSIS — O99613 Diseases of the digestive system complicating pregnancy, third trimester: Secondary | ICD-10-CM | POA: Insufficient documentation

## 2016-07-08 DIAGNOSIS — K920 Hematemesis: Secondary | ICD-10-CM | POA: Diagnosis not present

## 2016-07-08 DIAGNOSIS — O26893 Other specified pregnancy related conditions, third trimester: Secondary | ICD-10-CM | POA: Diagnosis present

## 2016-07-08 LAB — RAPID URINE DRUG SCREEN, HOSP PERFORMED
Amphetamines: NOT DETECTED
Barbiturates: NOT DETECTED
Benzodiazepines: NOT DETECTED
Cocaine: NOT DETECTED
Opiates: NOT DETECTED
Tetrahydrocannabinol: NOT DETECTED

## 2016-07-08 LAB — URINALYSIS, ROUTINE W REFLEX MICROSCOPIC
Bilirubin Urine: NEGATIVE
Glucose, UA: NEGATIVE mg/dL
Hgb urine dipstick: NEGATIVE
KETONES UR: NEGATIVE mg/dL
LEUKOCYTES UA: NEGATIVE
NITRITE: NEGATIVE
PROTEIN: NEGATIVE mg/dL
Specific Gravity, Urine: 1.012 (ref 1.005–1.030)
pH: 7 (ref 5.0–8.0)

## 2016-07-08 LAB — CBC
HEMATOCRIT: 28.9 % — AB (ref 36.0–46.0)
Hemoglobin: 9.9 g/dL — ABNORMAL LOW (ref 12.0–15.0)
MCH: 29.8 pg (ref 26.0–34.0)
MCHC: 34.3 g/dL (ref 30.0–36.0)
MCV: 87 fL (ref 78.0–100.0)
Platelets: 312 10*3/uL (ref 150–400)
RBC: 3.32 MIL/uL — ABNORMAL LOW (ref 3.87–5.11)
RDW: 13.9 % (ref 11.5–15.5)
WBC: 8 10*3/uL (ref 4.0–10.5)

## 2016-07-08 LAB — COMPREHENSIVE METABOLIC PANEL
ALBUMIN: 2.7 g/dL — AB (ref 3.5–5.0)
ALT: 16 U/L (ref 14–54)
AST: 24 U/L (ref 15–41)
Alkaline Phosphatase: 104 U/L (ref 38–126)
Anion gap: 7 (ref 5–15)
BUN: 6 mg/dL (ref 6–20)
CHLORIDE: 105 mmol/L (ref 101–111)
CO2: 21 mmol/L — AB (ref 22–32)
Calcium: 8.4 mg/dL — ABNORMAL LOW (ref 8.9–10.3)
Creatinine, Ser: 0.5 mg/dL (ref 0.44–1.00)
GFR calc Af Amer: >60 mL/min (ref >60)
GFR calc non Af Amer: >60 mL/min (ref >60)
Glucose, Bld: 78 mg/dL (ref 65–99)
POTASSIUM: 3.4 mmol/L — AB (ref 3.5–5.1)
SODIUM: 133 mmol/L — AB (ref 135–145)
Total Bilirubin: 0.5 mg/dL (ref 0.3–1.2)
Total Protein: 6.7 g/dL (ref 6.5–8.1)

## 2016-07-08 LAB — PROTEIN / CREATININE RATIO, URINE
Creatinine, Urine: 114 mg/dL
Protein Creatinine Ratio: 0.18 mg/mg{Cre} — ABNORMAL HIGH (ref 0.00–0.15)
Total Protein, Urine: 20 mg/dL

## 2016-07-08 MED ORDER — RANITIDINE HCL 150 MG PO TABS: 150.0000 mg | ORAL_TABLET | Freq: Every day | ORAL | 1 refills | Status: DC

## 2016-07-08 MED ORDER — GI COCKTAIL ~~LOC~~
30.0000 mL | Freq: Once | ORAL | Status: AC
  Administered 2016-07-08: 30 mL via ORAL
  Filled 2016-07-08: qty 30

## 2016-07-08 NOTE — Progress Notes (Signed)
Baby active. Transducer adj

## 2016-07-08 NOTE — MAU Provider Note (Signed)
History     CSN: [id number]  Arrival date and time: 07/08/16 4098   First Provider Initiated Contact with Patient 07/08/16 0809      Chief Complaint  Patient presents with  . Heartburn  . Emesis During Pregnancy   [id number] @31 .[redacted] weeks gestation here with hematemesis and hematemesis. She reports having reflux for the last 2 days. She used TUMS and went to sleep last night, she woke up 1 hr later and had an episode of emesis with blood. Her throat feels raw since. She denies sick contacts. She denies fever. She reports vomiting about every other day since early pregnancy, uses Phenergan for this. She denies VB, LOF, and ctx. She reports good FM. She has hx of marijuana use this pregnancy.   OB History    Gravida Para Term Preterm AB Living   5 2 2   2 2    SAB TAB Ectopic Multiple Live Births       1   2      Past Medical History:  Diagnosis Date  . Anemia    during first pregnancy  . Anemia   . Heartburn in pregnancy   . Herpes genitalia    doesn't think ever had outbreak    Past Surgical History:  Procedure Laterality Date  . CESAREAN SECTION  2009  . CESAREAN SECTION N/A 10/07/2013   Procedure: CESAREAN SECTION;  Surgeon: Lesly Dukes, MD;  Location: [facility];  Service: Obstetrics;  Laterality: N/A;  . DIAGNOSTIC LAPAROSCOPY WITH REMOVAL OF ECTOPIC PREGNANCY Left 08/02/2015   Procedure: DIAGNOSTIC LAPAROSCOPY WITH REMOVAL OF ECTOPIC PREGNANCY;  Surgeon: Catalina Antigua, MD;  Location: [facility];  Service: Gynecology;  Laterality: Left;  for ectopic     Family History  Problem Relation Age of Onset  . Diabetes Maternal Grandmother   . Hypertension Maternal Grandmother     Social History  Substance Use Topics  . Smoking status: Former Smoker    Packs/day: 0.50    Types: Cigarettes  . Smokeless tobacco: Never Used  . Alcohol use Yes    Allergies: No Known Allergies  Prescriptions Prior to Admission  Medication Sig Dispense Refill Last Dose  . calcium carbonate  (TUMS - DOSED IN MG ELEMENTAL CALCIUM) 500 MG chewable tablet Chew 2 tablets by mouth daily.   07/08/2016 at Unknown time  . Prenatal Vit-Fe Fumarate-FA (PRENATAL MULTIVITAMIN) TABS tablet Take 1 tablet by mouth daily at 12 noon.   07/07/2016 at Unknown time  . bisacodyl (DULCOLAX) 10 MG suppository Place 1 suppository (10 mg total) rectally as needed for moderate constipation. 12 suppository 0   . docusate sodium (COLACE) 100 MG capsule Take 1 capsule (100 mg total) by mouth 2 (two) times daily. 30 capsule 0   . Doxylamine-Pyridoxine (DICLEGIS) 10-10 MG TBEC Take 1 tablet by mouth at bedtime as needed. 100 tablet 1   . polyethylene glycol (MIRALAX / GLYCOLAX) packet Take 17 g by mouth daily. 14 each 0   . promethazine (PHENERGAN) 12.5 MG tablet Take 1 tablet (12.5 mg total) by mouth every 6 (six) hours as needed for nausea or vomiting. 30 tablet 0     Review of Systems  Constitutional: Negative.   Eyes: Negative.   Gastrointestinal: Positive for vomiting. Negative for constipation, diarrhea and nausea.  Genitourinary: Negative.   Neurological: Negative.    Physical Exam   Blood pressure 134/75, pulse 95, temperature 97.5 F (36.4 C), resp. rate 20, height 5\' 6"  (1.676 m), weight 98.1 kg (216  lb 3.2 oz), last menstrual period 12/04/2015.  Patient Vitals for the past 24 hrs:  BP Temp Pulse Resp Height Weight  07/08/16 0801 134/75 - 95 - - -  07/08/16 0746 127/76 - 94 - - -  07/08/16 0731 130/77 - 93 - - -  07/08/16 0719 141/81 - 94 - - -  07/08/16 0635 154/80 97.5 F (36.4 C) 102 20 5\' 6"  (1.676 m) 98.1 kg (216 lb 3.2 oz)   Physical Exam  Constitutional: She is oriented to person, place, and time. She appears well-developed and well-nourished. No distress.  HENT:  Head: Normocephalic and atraumatic.  Neck: Normal range of motion.  Cardiovascular: Normal rate.   Respiratory: Effort normal.  GI: Soft. She exhibits no distension. There is no tenderness.  gravid  Musculoskeletal: Normal  range of motion. She exhibits no edema.  Neurological: She is alert and oriented to person, place, and time. She has normal reflexes.  Skin: Skin is warm and dry.  Psychiatric: She has a normal mood and affect.   EFM: 140 bpm, mod variability, + accels, no decels Toco: none  Results for orders placed or performed during the hospital encounter of 07/08/16 (from the past 24 hour(s))  Urinalysis, Routine w reflex microscopic     Status: None   Collection Time: 07/08/16  6:46 AM  Result Value Ref Range   Color, Urine YELLOW YELLOW   APPearance CLEAR CLEAR   Specific Gravity, Urine 1.012 1.005 - 1.030   pH 7.0 5.0 - 8.0   Glucose, UA NEGATIVE NEGATIVE mg/dL   Hgb urine dipstick NEGATIVE NEGATIVE   Bilirubin Urine NEGATIVE NEGATIVE   Ketones, ur NEGATIVE NEGATIVE mg/dL   Protein, ur NEGATIVE NEGATIVE mg/dL   Nitrite NEGATIVE NEGATIVE   Leukocytes, UA NEGATIVE NEGATIVE  Protein / creatinine ratio, urine     Status: Abnormal   Collection Time: 07/08/16  6:46 AM  Result Value Ref Range   Creatinine, Urine 114.00 mg/dL   Total Protein, Urine 20 mg/dL   Protein Creatinine Ratio 0.18 (H) 0.00 - 0.15 mg/mg[Cre]  Urine rapid drug screen (hosp performed)     Status: None   Collection Time: 07/08/16  6:46 AM  Result Value Ref Range   Opiates NONE DETECTED NONE DETECTED   Cocaine NONE DETECTED NONE DETECTED   Benzodiazepines NONE DETECTED NONE DETECTED   Amphetamines NONE DETECTED NONE DETECTED   Tetrahydrocannabinol NONE DETECTED NONE DETECTED   Barbiturates NONE DETECTED NONE DETECTED  Comprehensive metabolic panel     Status: Abnormal   Collection Time: 07/08/16  8:23 AM  Result Value Ref Range   Sodium 133 (L) 135 - 145 mmol/L   Potassium 3.4 (L) 3.5 - 5.1 mmol/L   Chloride 105 101 - 111 mmol/L   CO2 21 (L) 22 - 32 mmol/L   Glucose, Bld 78 65 - 99 mg/dL   BUN 6 6 - 20 mg/dL   Creatinine, Ser 1.91 0.44 - 1.00 mg/dL   Calcium 8.4 (L) 8.9 - 10.3 mg/dL   Total Protein 6.7 6.5 - 8.1  g/dL   Albumin 2.7 (L) 3.5 - 5.0 g/dL   AST 24 15 - 41 U/L   ALT 16 14 - 54 U/L   Alkaline Phosphatase 104 38 - 126 U/L   Total Bilirubin 0.5 0.3 - 1.2 mg/dL   GFR calc non Af Amer >60 >60 mL/min   GFR calc Af Amer >60 >60 mL/min   Anion gap 7 5 - 15  CBC  Status: Abnormal   Collection Time: 07/08/16  8:23 AM  Result Value Ref Range   WBC 8.0 4.0 - 10.5 K/uL   RBC 3.32 (L) 3.87 - 5.11 MIL/uL   Hemoglobin 9.9 (L) 12.0 - 15.0 g/dL   HCT 16.128.9 (L) 09.636.0 - 04.546.0 %   MCV 87.0 78.0 - 100.0 fL   MCH 29.8 26.0 - 34.0 pg   MCHC 34.3 30.0 - 36.0 g/dL   RDW 40.913.9 81.111.5 - 91.415.5 %   Platelets 312 150 - 400 K/uL   MAU Course  Procedures Gi cocktail  MDM Labs ordered and reviewed. Pt reports improvement of heartburn since meds. Elevated BP noted today. No evidence of pre-e. Pre-e precautions discussed. Will treat for GERD. Stable for discharge home.  Assessment and Plan   1. [redacted] weeks gestation of pregnancy   2. NST (non-stress test) reactive   3. Gastroesophageal reflux disease with esophagitis   4. Borderline hypertension    [facility] GERD diet Pre-e precautions  Allergies as of 07/08/2016   No Known Allergies     Medication List    TAKE these medications   bisacodyl 10 MG suppository Commonly known as:  DULCOLAX Place 1 suppository (10 mg total) rectally as needed for moderate constipation.   calcium carbonate 500 MG chewable tablet Commonly known as:  TUMS - dosed in mg elemental calcium Chew 2 tablets by mouth daily.   docusate sodium 100 MG capsule Commonly known as:  COLACE Take 1 capsule (100 mg total) by mouth 2 (two) times daily.   polyethylene glycol packet Commonly known as:  MIRALAX / GLYCOLAX Take 17 g by mouth daily.   prenatal multivitamin Tabs tablet Take 1 tablet by mouth daily at 12 noon.   promethazine 12.5 MG tablet Commonly known as:  PHENERGAN Take 1 tablet (12.5 mg total) by mouth every 6 (six)  hours as needed for nausea or vomiting.   ranitidine 150 MG tablet Commonly known as:  ZANTAC Take 1 tablet (150 mg total) by mouth at bedtime.      Donette LarryMelanie Marlaine Arey, CNM 07/08/2016, 8:11 AM

## 2016-07-08 NOTE — MAU Note (Signed)
Had heartburn, took tums, went to sleep and awoke and vomited. Saw blood in emesis. Denies vag bleeding or LOF

## 2016-07-08 NOTE — Discharge Instructions (Signed)
Gastroesophageal Reflux Disease, Adult Introduction Normally, food travels down the esophagus and stays in the stomach to be digested. If a person has gastroesophageal reflux disease (GERD), food and stomach acid move back up into the esophagus. When this happens, the esophagus becomes sore and swollen (inflamed). Over time, GERD can make small holes (ulcers) in the lining of the esophagus. Follow these instructions at home: Diet  Follow a diet as told by your doctor. You may need to avoid foods and drinks such as:  Coffee and tea (with or without caffeine).  Drinks that contain alcohol.  Energy drinks and sports drinks.  Carbonated drinks or sodas.  Chocolate and cocoa.  Peppermint and mint flavorings.  Garlic and onions.  Horseradish.  Spicy and acidic foods, such as peppers, chili powder, curry powder, vinegar, hot sauces, and BBQ sauce.  Citrus fruit juices and citrus fruits, such as oranges, lemons, and limes.  Tomato-based foods, such as red sauce, chili, salsa, and pizza with red sauce.  Fried and fatty foods, such as donuts, french fries, potato chips, and high-fat dressings.  High-fat meats, such as hot dogs, rib eye steak, sausage, ham, and bacon.  High-fat dairy items, such as whole milk, butter, and cream cheese.  Eat small meals often. Avoid eating large meals.  Avoid drinking large amounts of liquid with your meals.  Avoid eating meals during the 2-3 hours before bedtime.  Avoid lying down right after you eat.  Do not exercise right after you eat. General instructions  Pay attention to any changes in your symptoms.  Take over-the-counter and prescription medicines only as told by your doctor. Do not take aspirin, ibuprofen, or other NSAIDs unless your doctor says it is okay.  Do not use any tobacco products, including cigarettes, chewing tobacco, and e-cigarettes. If you need help quitting, ask your doctor.  Wear loose clothes. Do not wear anything  tight around your waist.  Raise (elevate) the head of your bed about 6 inches (15 cm).  Try to lower your stress. If you need help doing this, ask your doctor.  If you are overweight, lose an amount of weight that is healthy for you. Ask your doctor about a safe weight loss goal.  Keep all follow-up visits as told by your doctor. This is important. Contact a doctor if:  You have new symptoms.  You lose weight and you do not know why it is happening.  You have trouble swallowing, or it hurts to swallow.  You have wheezing or a cough that keeps happening.  Your symptoms do not get better with treatment.  You have a hoarse voice. Get help right away if:  You have pain in your arms, neck, jaw, teeth, or back.  You feel sweaty, dizzy, or light-headed.  You have chest pain or shortness of breath.  You throw up (vomit) and your throw up looks like blood or coffee grounds.  You pass out (faint).  Your poop (stool) is bloody or black.  You cannot swallow, drink, or eat. This information is not intended to replace advice given to you by your health care provider. Make sure you discuss any questions you have with your health care provider. Document Released: 12/06/2007 Document Revised: 11/25/2015 Document Reviewed: 10/14/2014  2017 Elsevier Food Choices for Gastroesophageal Reflux Disease, Adult When you have gastroesophageal reflux disease (GERD), the foods you eat and your eating habits are very important. Choosing the right foods can help ease your discomfort. What guidelines do I need to follow?  Choose fruits, vegetables, whole grains, and low-fat dairy products.  Choose low-fat meat, fish, and poultry.  Limit fats such as oils, salad dressings, butter, nuts, and avocado.  Keep a food diary. This helps you identify foods that cause symptoms.  Avoid foods that cause symptoms. These may be different for everyone.  Eat small meals often instead of 3 large meals a  day.  Eat your meals slowly, in a place where you are relaxed.  Limit fried foods.  Cook foods using methods other than frying.  Avoid drinking alcohol.  Avoid drinking large amounts of liquids with your meals.  Avoid bending over or lying down until 2-3 hours after eating. What foods are not recommended? These are some foods and drinks that may make your symptoms worse: Vegetables  Tomatoes. Tomato juice. Tomato and spaghetti sauce. Chili peppers. Onion and garlic. Horseradish. Fruits  Oranges, grapefruit, and lemon (fruit and juice). Meats  High-fat meats, fish, and poultry. This includes hot dogs, ribs, ham, sausage, salami, and bacon. Dairy  Whole milk and chocolate milk. Sour cream. Cream. Butter. Ice cream. Cream cheese. Drinks  Coffee and tea. Bubbly (carbonated) drinks or energy drinks. Condiments  Hot sauce. Barbecue sauce. Sweets/Desserts  Chocolate and cocoa. Donuts. Peppermint and spearmint. Fats and Oils  High-fat foods. This includes [place] fries and potato chips. Other  Vinegar. Strong spices. This includes black pepper, white pepper, red pepper, cayenne, curry powder, cloves, ginger, and chili powder. The items listed above may not be a complete list of foods and drinks to avoid. Contact your dietitian for more information.  This information is not intended to replace advice given to you by your health care provider. Make sure you discuss any questions you have with your health care provider. Document Released: 12/19/2011 Document Revised: 11/25/2015 Document Reviewed: 04/23/2013 Elsevier Interactive Patient Education  2017 [employer].

## 2016-07-14 ENCOUNTER — Encounter: Payer: Self-pay | Admitting: *Deleted

## 2016-07-20 ENCOUNTER — Encounter: Payer: Self-pay | Admitting: *Deleted

## 2016-07-20 ENCOUNTER — Inpatient Hospital Stay (HOSPITAL_COMMUNITY)
Admission: AD | Admit: 2016-07-20 | Discharge: 2016-07-20 | Disposition: A | Discharge status: Home / Self Care | Payer: Medicaid Other | Source: Ambulatory Visit | Attending: Obstetrics & Gynecology | Admitting: Obstetrics & Gynecology

## 2016-07-20 ENCOUNTER — Encounter (HOSPITAL_COMMUNITY): Payer: Self-pay

## 2016-07-20 DIAGNOSIS — W109XXA Fall (on) (from) unspecified stairs and steps, initial encounter: Secondary | ICD-10-CM | POA: Insufficient documentation

## 2016-07-20 DIAGNOSIS — O169 Unspecified maternal hypertension, unspecified trimester: Secondary | ICD-10-CM

## 2016-07-20 DIAGNOSIS — Z87891 Personal history of nicotine dependence: Secondary | ICD-10-CM | POA: Diagnosis not present

## 2016-07-20 DIAGNOSIS — I10 Essential (primary) hypertension: Secondary | ICD-10-CM

## 2016-07-20 DIAGNOSIS — Z8489 Family history of other specified conditions: Secondary | ICD-10-CM

## 2016-07-20 DIAGNOSIS — Y92009 Unspecified place in unspecified non-institutional (private) residence as the place of occurrence of the external cause: Secondary | ICD-10-CM | POA: Diagnosis not present

## 2016-07-20 DIAGNOSIS — Z3A32 32 weeks gestation of pregnancy: Secondary | ICD-10-CM | POA: Diagnosis not present

## 2016-07-20 DIAGNOSIS — S79911A Unspecified injury of right hip, initial encounter: Secondary | ICD-10-CM | POA: Diagnosis not present

## 2016-07-20 DIAGNOSIS — O26893 Other specified pregnancy related conditions, third trimester: Secondary | ICD-10-CM | POA: Diagnosis not present

## 2016-07-20 DIAGNOSIS — O139 Gestational [pregnancy-induced] hypertension without significant proteinuria, unspecified trimester: Secondary | ICD-10-CM | POA: Insufficient documentation

## 2016-07-20 DIAGNOSIS — F1910 Other psychoactive substance abuse, uncomplicated: Secondary | ICD-10-CM | POA: Insufficient documentation

## 2016-07-20 DIAGNOSIS — W108XXA Fall (on) (from) other stairs and steps, initial encounter: Secondary | ICD-10-CM | POA: Diagnosis not present

## 2016-07-20 HISTORY — DX: Essential (primary) hypertension: I10

## 2016-07-20 HISTORY — DX: Family history of other specified conditions: Z84.89

## 2016-07-20 LAB — URINALYSIS, ROUTINE W REFLEX MICROSCOPIC
Bilirubin Urine: NEGATIVE
Glucose, UA: NEGATIVE mg/dL
Hgb urine dipstick: NEGATIVE
Ketones, ur: NEGATIVE mg/dL
Nitrite: NEGATIVE
Protein, ur: NEGATIVE mg/dL
Specific Gravity, Urine: 1.013 (ref 1.005–1.030)
pH: 6 (ref 5.0–8.0)

## 2016-07-20 MED ORDER — ACETAMINOPHEN 500 MG PO TABS
1000.0000 mg | ORAL_TABLET | Freq: Once | ORAL | Status: AC
  Administered 2016-07-20: 1000 mg via ORAL
  Filled 2016-07-20: qty 2

## 2016-07-20 NOTE — MAU Note (Signed)
Pt fell about 30 minutes before she arrived to MAU. Slipped on her stairs inside the house and went down about 5 stairs on her bottom.

## 2016-07-20 NOTE — Discharge Instructions (Signed)
What Do I Need to Know About Injuries During Pregnancy? °Trauma is the most common cause of injury and death in pregnant women. This can also result in significant harm or death of the baby. °Your baby is protected in the womb (uterus) by a sac filled with fluid (amniotic sac). Your baby can be harmed if there is direct, high-impact trauma to your abdomen and pelvis. This type of trauma can result in tearing of your uterus, the placenta pulling away from the wall of the uterus (placenta abruption), or the amniotic sac breaking open (rupture of membranes). These injuries can decrease or stop the blood supply to your baby or cause you to go into labor earlier than expected. Minor falls and low-impact automobile accidents do not usually harm your baby, even if they do minimally harm you. °WHAT KIND OF INJURIES CAN AFFECT MY PREGNANCY? °The most common causes of injury or death to a baby include: °· Falls. Falls are more common in the second and third trimester of the pregnancy. Factors that increase your risk of falling include: °¨ Increase in your weight. °¨ The change in your center of gravity. °¨ Tripping over an object that cannot be seen. °¨ Increased looseness (laxity) of your ligaments resulting in less coordinated movements (you may feel clumsy). °¨ Falling during high-risk activities like horseback riding or skiing. °· Automobile accidents. It is important to wear your seat belt properly, with the lap belt below your abdomen, and always practice safe driving. °· Domestic violence or assault. °· Burns (fire or electrical). °The most common causes of injury or death to the pregnant woman include: °· Injuries that cause severe bleeding, shock, and loss of blood flow to major organs. °· Head and neck injuries that result in severe brain or spinal damage. °· Chest trauma that can cause direct injury to the heart and lungs or any injury that affects the area enclosed by the ribs. Trauma to this area can result in  cardiorespiratory arrest. °WHAT CAN I DO TO PROTECT MYSELF AND MY BABY FROM INJURY WHILE I AM PREGNANT? °· Remove slippery rugs and loose objects on the floor that increase your risk of tripping. °· Avoid walking on wet or slippery floors. °· Wear comfortable shoes that have a good grip on the sole. Do not wear high-heeled shoes. °· Always wear your seat belt properly, with the lap belt below your abdomen, and always practice safe driving. Do not ride on a motorcycle while pregnant. °· Do not participate in high-impact activities or sports. °· Avoid fires, starting fires, lifting heavy pots of boiling or hot liquids, and fixing electrical problems. °· Only take over-the-counter or prescription medicines for pain, fever, or discomfort as directed by your health care provider. °· Know your blood type and the father's blood type in case you develop vaginal bleeding or experience an injury for which a blood transfusion may be necessary. °· Call your local emergency services (911 in the [place] if you are a victim of domestic violence or assault. Spousal abuse can be a significant cause of trauma during pregnancy. For help and support, contact the National Domestic Violence Hotline. °WHEN SHOULD I SEEK IMMEDIATE MEDICAL CARE?  °· You fall on your abdomen or experience any high-force accident or injury. °· You have been assaulted (domestic or otherwise). °· You have been in a car accident. °· You develop vaginal bleeding. °· You develop fluid leaking from the vagina. °· You develop uterine contractions (pelvic cramping, pain, or significant low back   pain). °· You become weak or faint, or have uncontrolled vomiting after trauma. °· You had a serious burn. This includes burns to the face, neck, hands, or genitals, or burns greater than the size of your palm anywhere else. °· You develop neck stiffness or pain after a fall or from other trauma. °· You develop a headache or vision problems after a fall or from other  trauma. °· You do not feel the baby moving or the baby is not moving as much as before a fall or other trauma. °This information is not intended to replace advice given to you by your health care provider. Make sure you discuss any questions you have with your health care provider. °Document Released: 07/27/2004 Document Revised: 07/10/2014 Document Reviewed: 03/26/2013 °Elsevier Interactive Patient Education © 2017 Elsevier Inc. ° °

## 2016-07-20 NOTE — MAU Provider Note (Signed)
History     CSN: [id number]  Arrival date and time: 07/20/16 1548   First Provider Initiated Contact with Patient 07/20/16 1639      Chief Complaint  Patient presents with  . Fall   HPI Joann King is a 26 y.o. [id number] at [id number] who presents for fall. Fall occurred 30 minutes PTA. States she was walking down the stairs inside her home when she fell down 5 steps, landing on her bottom. Denies hitting her abdomen or LOC. Reports constant lower abdominal pain & right anterior thigh pain since fall. Pain is worse with walking. Rates pain 5/10. Has not treated. Denies vaginal bleeding or LOF. Positive fetal movement.   OB History    Gravida Para Term Preterm AB Living   5 2 2   2 2    SAB TAB Ectopic Multiple Live Births     1 1   2       Past Medical History:  Diagnosis Date  . Anemia    during first pregnancy  . Anemia   . Heartburn in pregnancy   . Herpes genitalia    doesn't think ever had outbreak    Past Surgical History:  Procedure Laterality Date  . CESAREAN SECTION  2009  . CESAREAN SECTION N/A 10/07/2013   Procedure: CESAREAN SECTION;  Surgeon: Lesly DukesKelly H Leggett, MD;  Location: [facility];  Service: Obstetrics;  Laterality: N/A;  . DIAGNOSTIC LAPAROSCOPY WITH REMOVAL OF ECTOPIC PREGNANCY Left 08/02/2015   Procedure: DIAGNOSTIC LAPAROSCOPY WITH REMOVAL OF ECTOPIC PREGNANCY;  Surgeon: Catalina AntiguaPeggy Constant, MD;  Location: [facility];  Service: Gynecology;  Laterality: Left;  for ectopic     Family History  Problem Relation Age of Onset  . Diabetes Maternal Grandmother   . Hypertension Maternal Grandmother     Social History  Substance Use Topics  . Smoking status: Former Smoker    Packs/day: 0.50    Types: Cigarettes  . Smokeless tobacco: Never Used  . Alcohol use Yes    Allergies: No Known Allergies  Prescriptions Prior to Admission  Medication Sig Dispense Refill Last Dose  . bisacodyl (DULCOLAX) 10 MG suppository Place 1 suppository (10 mg total) rectally as needed for  moderate constipation. 12 suppository 0   . calcium carbonate (TUMS - DOSED IN MG ELEMENTAL CALCIUM) 500 MG chewable tablet Chew 2 tablets by mouth daily.   07/08/2016 at Unknown time  . docusate sodium (COLACE) 100 MG capsule Take 1 capsule (100 mg total) by mouth 2 (two) times daily. 30 capsule 0   . polyethylene glycol (MIRALAX / GLYCOLAX) packet Take 17 g by mouth daily. 14 each 0   . Prenatal Vit-Fe Fumarate-FA (PRENATAL MULTIVITAMIN) TABS tablet Take 1 tablet by mouth daily at 12 noon.    07/07/2016 at Unknown time  . promethazine (PHENERGAN) 12.5 MG tablet Take 1 tablet (12.5 mg total) by mouth every 6 (six) hours as needed for nausea or vomiting. 30 tablet 0   . ranitidine (ZANTAC) 150 MG tablet Take 1 tablet (150 mg total) by mouth at bedtime. 30 tablet 1     Review of Systems  Constitutional: Negative.   Gastrointestinal: Positive for abdominal pain. Negative for constipation, diarrhea, nausea and vomiting.  Genitourinary: Negative for vaginal bleeding and vaginal discharge.  Musculoskeletal: Negative for back pain, gait problem and neck pain.  Neurological: Negative for dizziness and syncope.   Physical Exam   Blood pressure 130/74, pulse 113, temperature 98.3 F (36.8 C), resp. rate 18, last menstrual period 12/04/2015.  Physical Exam  Nursing note and vitals reviewed. Constitutional: She is oriented to person, place, and time. She appears well-developed and well-nourished. No distress.  HENT:  Head: Normocephalic and atraumatic.  Eyes: Conjunctivae are normal. Right eye exhibits no discharge. Left eye exhibits no discharge. No scleral icterus.  Neck: Normal range of motion.  Cardiovascular: Normal rate, regular rhythm and normal heart sounds.   No murmur heard. Respiratory: Effort normal and breath sounds normal. No respiratory distress. She has no wheezes.  GI: Soft. There is no tenderness.  No bruising, no tenderness, no palpable contractions  Musculoskeletal: Normal range  of motion. She exhibits no edema, tenderness or deformity.  Neurological: She is alert and oriented to person, place, and time.  Skin: Skin is warm and dry. She is not diaphoretic.  Psychiatric: She has a normal mood and affect. Her behavior is normal. Judgment and thought content normal.   Dilation: Closed Exam by:: Judeth Horn NP  Fetal Tracing:  Baseline: 135 Variability: moderate Accelerations:15x15 Decelerations: none  Toco: UI & irr ctx MAU Course  Procedures Results for orders placed or performed during the hospital encounter of 07/20/16 (from the past 24 hour(s))  Urinalysis, Routine w reflex microscopic     Status: Abnormal   Collection Time: 07/20/16  4:07 PM  Result Value Ref Range   Color, Urine YELLOW YELLOW   APPearance CLEAR CLEAR   Specific Gravity, Urine 1.013 1.005 - 1.030   pH 6.0 5.0 - 8.0   Glucose, UA NEGATIVE NEGATIVE mg/dL   Hgb urine dipstick NEGATIVE NEGATIVE   Bilirubin Urine NEGATIVE NEGATIVE   Ketones, ur NEGATIVE NEGATIVE mg/dL   Protein, ur NEGATIVE NEGATIVE mg/dL   Nitrite NEGATIVE NEGATIVE   Leukocytes, UA MODERATE (A) NEGATIVE   RBC / HPF 0-5 0 - 5 RBC/hpf   WBC, UA 0-5 0 - 5 WBC/hpf   Bacteria, UA FEW (A) NONE SEEN   Squamous Epithelial / LPF 0-5 (A) NONE SEEN   Mucous PRESENT     MDM Reactive fetal tracing Occasional episodes of contractions & UI Cervix closed Tylenol 1 gm PO for hip pain --- abdominal pain resolved prior to tylenol S/w Dr. Debroah Loop regarding tracing & TOCO. Ok to discharge home.   Assessment and Plan  A; 1. Fall down stairs, initial encounter    P: Discharge home Take tylenol prn pain Discussed reasons to return to MAU Keep f/u with OB  Judeth Horn 07/20/2016, 4:39 PM

## 2016-07-20 NOTE — MAU Note (Signed)
Urine sent to lab 

## 2016-07-24 ENCOUNTER — Encounter (HOSPITAL_COMMUNITY): Payer: Self-pay

## 2016-07-24 ENCOUNTER — Encounter: Payer: Self-pay | Admitting: Obstetrics & Gynecology

## 2016-07-24 ENCOUNTER — Ambulatory Visit (INDEPENDENT_AMBULATORY_CARE_PROVIDER_SITE_OTHER): Payer: Medicaid Other | Admitting: Obstetrics & Gynecology

## 2016-07-24 DIAGNOSIS — O0993 Supervision of high risk pregnancy, unspecified, third trimester: Secondary | ICD-10-CM

## 2016-07-24 DIAGNOSIS — O0990 Supervision of high risk pregnancy, unspecified, unspecified trimester: Secondary | ICD-10-CM | POA: Insufficient documentation

## 2016-07-24 DIAGNOSIS — O163 Unspecified maternal hypertension, third trimester: Secondary | ICD-10-CM | POA: Diagnosis present

## 2016-07-24 DIAGNOSIS — O169 Unspecified maternal hypertension, unspecified trimester: Secondary | ICD-10-CM

## 2016-07-24 DIAGNOSIS — O133 Gestational [pregnancy-induced] hypertension without significant proteinuria, third trimester: Secondary | ICD-10-CM

## 2016-07-24 LAB — POCT URINALYSIS DIP (DEVICE)
Bilirubin Urine: NEGATIVE
GLUCOSE, UA: NEGATIVE mg/dL
Hgb urine dipstick: NEGATIVE
Nitrite: NEGATIVE
Protein, ur: 30 mg/dL — AB
SPECIFIC GRAVITY, URINE: 1.02 (ref 1.005–1.030)
Urobilinogen, UA: 0.2 mg/dL (ref 0.0–1.0)
pH: 7 (ref 5.0–8.0)

## 2016-07-24 NOTE — Progress Notes (Signed)
   PRENATAL VISIT NOTE  Subjective:  Joann King is a 26 y.o. [phone] at 2724w2d being seen today for ongoing prenatal care.  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  Patient reports no complaints.  Contractions: Irregular. Vag. Bleeding: None.  Movement: Present. Denies leaking of fluid.   The following portions of the patient's history were reviewed and updated as appropriate: allergies, current medications, past family history, past medical history, past social history, past surgical history and problem list. Problem list updated.  Objective:   Vitals:   07/24/16 0932  BP: 119/79  Pulse: (!) 112  Weight: 221 lb (100.2 kg)    Fetal Status: Fetal Heart Rate (bpm): 140   Movement: Present     General:  Alert, oriented and cooperative. Patient is in no acute distress.  Skin: Skin is warm and dry. No rash noted.   Cardiovascular: Normal heart rate noted  Respiratory: Normal respiratory effort, no problems with respiration noted  Abdomen: Soft, gravid, appropriate for gestational age. Pain/Pressure: Present     Pelvic:  Cervical exam deferred        Extremities: Normal range of motion.  Edema: None  Mental Status: Normal mood and affect. Normal behavior. Normal judgment and thought content.   Assessment and Plan:  Pregnancy: [id number] at 4474w2d  1. Supervision of high risk pregnancy in third trimester -Records reveiwed from health dept (115 pages)  2. Hypertension during pregnancy, antepartum, unspecified hypertension in pregnancy type New diagnosis of mild gestational htn; needs 2x  weekly testing and US for growth.  Will need c/s and BTL at 37 weeks.  Pt denies having HTN prior to pregnancy (no elevated values in Epic other than [facility] visit recently).  Preterm labor symptoms and general obstetric precautions including but not limited to vaginal bleeding, contractions, leaking of fluid and fetal movement were reviewed in detail with the patient. Please refer to After Visit Summary for other counseling recommendations.  Return in about 2 weeks (around 08/07/2016).   30 minutes spent face to face with patient and reviewed all records with patient and >50% counseling.     Lesly DukesKelly H Altha Sweitzer, MD

## 2016-07-24 NOTE — Assessment & Plan Note (Signed)
Three elevated pressures this pregnancy--2 t health dept and one at [facility]; Will treat like gestational HTN.

## 2016-07-28 ENCOUNTER — Encounter (HOSPITAL_COMMUNITY): Payer: Self-pay

## 2016-07-28 ENCOUNTER — Ambulatory Visit (HOSPITAL_COMMUNITY)
Admission: RE | Admit: 2016-07-28 | Discharge: 2016-07-28 | Disposition: A | Discharge status: Home / Self Care | Payer: Medicaid Other | Source: Ambulatory Visit | Attending: Obstetrics & Gynecology | Admitting: Obstetrics & Gynecology

## 2016-07-28 ENCOUNTER — Other Ambulatory Visit: Payer: Self-pay | Admitting: Obstetrics & Gynecology

## 2016-07-28 DIAGNOSIS — O169 Unspecified maternal hypertension, unspecified trimester: Secondary | ICD-10-CM

## 2016-07-28 DIAGNOSIS — O163 Unspecified maternal hypertension, third trimester: Secondary | ICD-10-CM | POA: Insufficient documentation

## 2016-07-28 DIAGNOSIS — O34219 Maternal care for unspecified type scar from previous cesarean delivery: Secondary | ICD-10-CM | POA: Insufficient documentation

## 2016-07-28 DIAGNOSIS — Z363 Encounter for antenatal screening for malformations: Secondary | ICD-10-CM

## 2016-07-28 DIAGNOSIS — Z3A33 33 weeks gestation of pregnancy: Secondary | ICD-10-CM

## 2016-07-28 DIAGNOSIS — Z8489 Family history of other specified conditions: Secondary | ICD-10-CM

## 2016-07-28 DIAGNOSIS — O10013 Pre-existing essential hypertension complicating pregnancy, third trimester: Secondary | ICD-10-CM

## 2016-07-28 DIAGNOSIS — Z3689 Encounter for other specified antenatal screening: Secondary | ICD-10-CM | POA: Diagnosis present

## 2016-07-28 DIAGNOSIS — F1910 Other psychoactive substance abuse, uncomplicated: Secondary | ICD-10-CM

## 2016-07-28 DIAGNOSIS — O0993 Supervision of high risk pregnancy, unspecified, third trimester: Secondary | ICD-10-CM

## 2016-08-01 ENCOUNTER — Other Ambulatory Visit: Payer: Medicaid Other

## 2016-08-03 ENCOUNTER — Ambulatory Visit (INDEPENDENT_AMBULATORY_CARE_PROVIDER_SITE_OTHER): Payer: Medicaid Other | Admitting: *Deleted

## 2016-08-03 ENCOUNTER — Ambulatory Visit: Payer: Self-pay

## 2016-08-03 VITALS — BP 124/63 | HR 113

## 2016-08-03 DIAGNOSIS — O169 Unspecified maternal hypertension, unspecified trimester: Secondary | ICD-10-CM

## 2016-08-03 DIAGNOSIS — Z3689 Encounter for other specified antenatal screening: Secondary | ICD-10-CM | POA: Diagnosis not present

## 2016-08-03 DIAGNOSIS — O133 Gestational [pregnancy-induced] hypertension without significant proteinuria, third trimester: Secondary | ICD-10-CM

## 2016-08-03 NOTE — Progress Notes (Signed)
Pt informed that the ultrasound is considered a limited OB ultrasound and is not intended to be a complete ultrasound exam.  Patient also informed that the ultrasound is not being completed with the intent of assessing for fetal or placental anomalies or any pelvic abnormalities.  Explained that the purpose of today's ultrasound is to assess for presentation and amniotic fluid volume.  Patient acknowledges the purpose of the exam and the limitations of the study.    

## 2016-08-07 ENCOUNTER — Other Ambulatory Visit: Payer: Medicaid Other | Admitting: Family Medicine

## 2016-08-09 ENCOUNTER — Encounter (HOSPITAL_COMMUNITY): Payer: Self-pay

## 2016-08-10 ENCOUNTER — Telehealth: Payer: Self-pay | Admitting: *Deleted

## 2016-08-10 ENCOUNTER — Other Ambulatory Visit: Payer: Medicaid Other | Admitting: Obstetrics and Gynecology

## 2016-08-10 NOTE — Telephone Encounter (Signed)
Called pt due to 2 missed appts from this week.  Pt states that she did not have childcare and could not keep the appts. She reports very good FM daily and agrees to appt for next week. She will be called by our scheduling staff with the information. She stated that a voicemail message is ok.

## 2016-08-14 ENCOUNTER — Encounter: Payer: Self-pay | Admitting: Obstetrics and Gynecology

## 2016-08-14 ENCOUNTER — Other Ambulatory Visit (HOSPITAL_COMMUNITY)
Admission: RE | Admit: 2016-08-14 | Discharge: 2016-08-14 | Disposition: A | Discharge status: Home / Self Care | Payer: Medicaid Other | Source: Ambulatory Visit | Attending: Obstetrics and Gynecology | Admitting: Obstetrics and Gynecology

## 2016-08-14 ENCOUNTER — Ambulatory Visit (INDEPENDENT_AMBULATORY_CARE_PROVIDER_SITE_OTHER): Payer: Medicaid Other | Admitting: Obstetrics and Gynecology

## 2016-08-14 ENCOUNTER — Ambulatory Visit: Payer: Self-pay

## 2016-08-14 VITALS — BP 129/67 | HR 112 | Wt 234.1 lb

## 2016-08-14 DIAGNOSIS — O133 Gestational [pregnancy-induced] hypertension without significant proteinuria, third trimester: Secondary | ICD-10-CM | POA: Diagnosis present

## 2016-08-14 DIAGNOSIS — O34219 Maternal care for unspecified type scar from previous cesarean delivery: Secondary | ICD-10-CM

## 2016-08-14 DIAGNOSIS — Z3689 Encounter for other specified antenatal screening: Secondary | ICD-10-CM

## 2016-08-14 DIAGNOSIS — O0993 Supervision of high risk pregnancy, unspecified, third trimester: Secondary | ICD-10-CM

## 2016-08-14 DIAGNOSIS — Z98891 History of uterine scar from previous surgery: Secondary | ICD-10-CM

## 2016-08-14 DIAGNOSIS — Z113 Encounter for screening for infections with a predominantly sexual mode of transmission: Secondary | ICD-10-CM | POA: Diagnosis present

## 2016-08-14 DIAGNOSIS — O139 Gestational [pregnancy-induced] hypertension without significant proteinuria, unspecified trimester: Secondary | ICD-10-CM

## 2016-08-14 LAB — COMPREHENSIVE METABOLIC PANEL
ALK PHOS: 137 U/L — AB (ref 33–115)
ALT: 9 U/L (ref 6–29)
AST: 19 U/L (ref 10–30)
Albumin: 2.9 g/dL — ABNORMAL LOW (ref 3.6–5.1)
BILIRUBIN TOTAL: 0.3 mg/dL (ref 0.2–1.2)
BUN: 7 mg/dL (ref 7–25)
CO2: 23 mmol/L (ref 20–31)
Calcium: 8.9 mg/dL (ref 8.6–10.2)
Chloride: 107 mmol/L (ref 98–110)
Creat: 0.75 mg/dL (ref 0.50–1.10)
GLUCOSE: 82 mg/dL (ref 65–99)
POTASSIUM: 4.1 mmol/L (ref 3.5–5.3)
Sodium: 136 mmol/L (ref 135–146)
Total Protein: 6.1 g/dL (ref 6.1–8.1)

## 2016-08-14 LAB — POCT URINALYSIS DIP (DEVICE)
Bilirubin Urine: NEGATIVE
GLUCOSE, UA: NEGATIVE mg/dL
Hgb urine dipstick: NEGATIVE
Ketones, ur: NEGATIVE mg/dL
NITRITE: NEGATIVE
PROTEIN: NEGATIVE mg/dL
Specific Gravity, Urine: 1.015 (ref 1.005–1.030)
UROBILINOGEN UA: 0.2 mg/dL (ref 0.0–1.0)
pH: 7 (ref 5.0–8.0)

## 2016-08-14 NOTE — Progress Notes (Signed)
Prenatal Visit Note Date: 08/14/2016 Clinic: [facility]  Subjective:  Joann King is a 26 y.o. [id number] at 3960w2d being seen today for ongoing prenatal care.  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  Patient reports no complaints.   Contractions: Irregular. Vag. Bleeding: None.  Movement: Present. Denies leaking of fluid.   The following portions of the patient's history were reviewed and updated as appropriate: allergies, current medications, past family history, past medical history, past social history, past surgical history and problem list. Problem list updated.  Objective:   Vitals:   08/14/16 1502  BP: 129/67  Pulse: (!) 112  Weight: 234 lb 1.6 oz (106.2 kg)    Fetal Status: Fetal Heart Rate (bpm): NST Fundal Height: 36 cm Movement: Present  Presentation: Vertex  General:  Alert, oriented and cooperative. Patient is in no acute distress.  Skin: Skin is warm and dry. No rash noted.   Cardiovascular: Normal heart rate noted  Respiratory: Normal respiratory effort, no problems with respiration noted  Abdomen: Soft, gravid, appropriate for gestational age. Pain/Pressure: Present     Pelvic:  Cervical exam deferred        Extremities: Normal range of motion.     Mental Status: Normal mood and affect. Normal behavior. Normal judgment and thought content.   Urinalysis:      Assessment and Plan:  Pregnancy: [id number] at 3060w2d  1. Gestational hypertension without significant proteinuria, antepartum Check surveillance HELLP labs today. 1/26 efw/ac/afi normal.  rNST today (130 baseline, +accels, no  decel, mod variability, negative toco x 3767m) Normal AFI today  2. Supervision of high risk pregnancy in third trimester Routine care. LARC - Culture, beta strep (group b only) - GC/Chlamydia probe amp ([place]  3. History of cesarean delivery Set up for 37wk rpt c-section already  Preterm labor symptoms and general obstetric precautions including but not limited to vaginal bleeding, contractions, leaking of fluid and fetal movement were reviewed in detail with the patient. Please refer to After Visit Summary for other counseling recommendations.  RTC: continue with 2x/week testing   Aspen Bingharlie Gilles Trimpe, MD

## 2016-08-14 NOTE — Progress Notes (Signed)
Pt informed that the ultrasound is considered a limited OB ultrasound and is not intended to be a complete ultrasound exam.  Patient also informed that the ultrasound is not being completed with the intent of assessing for fetal or placental anomalies or any pelvic abnormalities.  Explained that the purpose of today's ultrasound is to assess for presentation and amniotic fluid volume.  Patient acknowledges the purpose of the exam and the limitations of the study.    Rpt C/S scheduled 2/17

## 2016-08-15 ENCOUNTER — Encounter: Payer: Self-pay | Admitting: Obstetrics and Gynecology

## 2016-08-15 DIAGNOSIS — D649 Anemia, unspecified: Secondary | ICD-10-CM | POA: Insufficient documentation

## 2016-08-15 LAB — CBC
HCT: 29.5 % — ABNORMAL LOW (ref 35.0–45.0)
Hemoglobin: 9.6 g/dL — ABNORMAL LOW (ref 11.7–15.5)
MCH: 28.6 pg (ref 27.0–33.0)
MCHC: 32.5 g/dL (ref 32.0–36.0)
MCV: 87.8 fL (ref 80.0–100.0)
MPV: 9 fL (ref 7.5–12.5)
PLATELETS: 353 10*3/uL (ref 140–400)
RBC: 3.36 MIL/uL — ABNORMAL LOW (ref 3.80–5.10)
RDW: 13.8 % (ref 11.0–15.0)
WBC: 9.1 10*3/uL (ref 3.8–10.8)

## 2016-08-15 LAB — GC/CHLAMYDIA PROBE AMP (~~LOC~~) NOT AT ARMC
Chlamydia: NEGATIVE
Neisseria Gonorrhea: NEGATIVE

## 2016-08-16 ENCOUNTER — Encounter: Payer: Self-pay | Admitting: Obstetrics and Gynecology

## 2016-08-16 DIAGNOSIS — O99820 Streptococcus B carrier state complicating pregnancy: Secondary | ICD-10-CM | POA: Insufficient documentation

## 2016-08-16 LAB — CULTURE, BETA STREP (GROUP B ONLY)

## 2016-08-17 ENCOUNTER — Encounter (HOSPITAL_COMMUNITY)
Admission: RE | Admit: 2016-08-17 | Discharge: 2016-08-17 | Disposition: A | Discharge status: Home / Self Care | Payer: Medicaid Other | Source: Ambulatory Visit | Attending: Obstetrics and Gynecology | Admitting: Obstetrics and Gynecology

## 2016-08-17 ENCOUNTER — Ambulatory Visit (INDEPENDENT_AMBULATORY_CARE_PROVIDER_SITE_OTHER): Payer: Medicaid Other | Admitting: Obstetrics and Gynecology

## 2016-08-17 VITALS — BP 117/66 | HR 112

## 2016-08-17 DIAGNOSIS — O139 Gestational [pregnancy-induced] hypertension without significant proteinuria, unspecified trimester: Secondary | ICD-10-CM

## 2016-08-17 DIAGNOSIS — O133 Gestational [pregnancy-induced] hypertension without significant proteinuria, third trimester: Secondary | ICD-10-CM

## 2016-08-17 LAB — BASIC METABOLIC PANEL
ANION GAP: 8 (ref 5–15)
BUN: <5 mg/dL — ABNORMAL LOW (ref 6–20)
CHLORIDE: 105 mmol/L (ref 101–111)
CO2: 21 mmol/L — ABNORMAL LOW (ref 22–32)
Calcium: 8.6 mg/dL — ABNORMAL LOW (ref 8.9–10.3)
Creatinine, Ser: 0.6 mg/dL (ref 0.44–1.00)
GFR calc Af Amer: >60 mL/min (ref >60)
Glucose, Bld: 108 mg/dL — ABNORMAL HIGH (ref 65–99)
POTASSIUM: 3.7 mmol/L (ref 3.5–5.1)
SODIUM: 134 mmol/L — AB (ref 135–145)

## 2016-08-17 LAB — CBC
HCT: 29 % — ABNORMAL LOW (ref 36.0–46.0)
HEMOGLOBIN: 10 g/dL — AB (ref 12.0–15.0)
MCH: 28.9 pg (ref 26.0–34.0)
MCHC: 34.5 g/dL (ref 30.0–36.0)
MCV: 83.8 fL (ref 78.0–100.0)
PLATELETS: 343 10*3/uL (ref 150–400)
RBC: 3.46 MIL/uL — ABNORMAL LOW (ref 3.87–5.11)
RDW: 14.2 % (ref 11.5–15.5)
WBC: 9.3 10*3/uL (ref 4.0–10.5)

## 2016-08-17 NOTE — Progress Notes (Signed)
2/15- NST reviewed and reactive

## 2016-08-17 NOTE — Progress Notes (Signed)
Rpt C/S scheduled 2/17.

## 2016-08-17 NOTE — Patient Instructions (Signed)
20 Spruha Burnell BlanksM Georg  08/17/2016   Your procedure is scheduled on:  08/19/2016  [facility] through the [facility] at 0930 AM.  Pick up the phone at the desk and dial [phone].   Call this number if you have problems the morning of surgery: [phone]   Remember:   Do not eat food:After Midnight.  Do not drink clear liquids: After Midnight.  Take these medicines the morning of surgery with A SIP OF WATER: zantac   Do not wear jewelry, make-up or nail polish.  Do not wear lotions, powders, or perfumes. Do not wear deodorant.  Do not shave 48 hours prior to surgery.  Do not bring valuables to the hospital.  [facility] is not   responsible for any belongings or valuables brought to the hospital.  Contacts, dentures or bridgework may not be worn into surgery.  Leave suitcase in the car. After surgery it may be brought to your room.  For patients admitted to the hospital, checkout time is 11:00 AM the day of              discharge.   Patients discharged the day of surgery will not be allowed to drive             home.  Name and phone number of your driver: na  Special Instructions:   N/A   Please read over the following fact sheets that you were given:   Surgical Site Infection Prevention

## 2016-08-18 ENCOUNTER — Encounter (HOSPITAL_COMMUNITY)
Admission: RE | Admit: 2016-08-18 | Discharge: 2016-08-18 | Disposition: A | Discharge status: Home / Self Care | Payer: Medicaid Other | Source: Ambulatory Visit

## 2016-08-18 LAB — RPR: RPR Ser Ql: NONREACTIVE

## 2016-08-19 ENCOUNTER — Encounter (HOSPITAL_COMMUNITY)
Admission: RE | Disposition: A | Discharge status: Home / Self Care | Payer: Self-pay | Source: Ambulatory Visit | Attending: Obstetrics and Gynecology

## 2016-08-19 ENCOUNTER — Inpatient Hospital Stay (HOSPITAL_COMMUNITY)
Admission: RE | Admit: 2016-08-19 | Discharge: 2016-08-22 | DRG: 766 | Disposition: A | Discharge status: Home / Self Care | Payer: Medicaid Other | Source: Ambulatory Visit | Attending: Obstetrics and Gynecology | Admitting: Obstetrics and Gynecology

## 2016-08-19 ENCOUNTER — Encounter (HOSPITAL_COMMUNITY): Payer: Self-pay | Admitting: *Deleted

## 2016-08-19 ENCOUNTER — Inpatient Hospital Stay (HOSPITAL_COMMUNITY): Payer: Medicaid Other | Admitting: Anesthesiology

## 2016-08-19 DIAGNOSIS — O34211 Maternal care for low transverse scar from previous cesarean delivery: Secondary | ICD-10-CM | POA: Diagnosis present

## 2016-08-19 DIAGNOSIS — O135 Gestational [pregnancy-induced] hypertension without significant proteinuria, complicating the puerperium: Secondary | ICD-10-CM | POA: Diagnosis present

## 2016-08-19 DIAGNOSIS — K219 Gastro-esophageal reflux disease without esophagitis: Secondary | ICD-10-CM | POA: Diagnosis present

## 2016-08-19 DIAGNOSIS — O99214 Obesity complicating childbirth: Secondary | ICD-10-CM | POA: Diagnosis present

## 2016-08-19 DIAGNOSIS — O99824 Streptococcus B carrier state complicating childbirth: Secondary | ICD-10-CM | POA: Diagnosis present

## 2016-08-19 DIAGNOSIS — Z3A37 37 weeks gestation of pregnancy: Secondary | ICD-10-CM

## 2016-08-19 DIAGNOSIS — Z302 Encounter for sterilization: Secondary | ICD-10-CM

## 2016-08-19 DIAGNOSIS — Z8249 Family history of ischemic heart disease and other diseases of the circulatory system: Secondary | ICD-10-CM | POA: Diagnosis not present

## 2016-08-19 DIAGNOSIS — O9962 Diseases of the digestive system complicating childbirth: Secondary | ICD-10-CM | POA: Diagnosis present

## 2016-08-19 DIAGNOSIS — O134 Gestational [pregnancy-induced] hypertension without significant proteinuria, complicating childbirth: Principal | ICD-10-CM | POA: Diagnosis present

## 2016-08-19 DIAGNOSIS — Z6838 Body mass index (BMI) 38.0-38.9, adult: Secondary | ICD-10-CM | POA: Diagnosis not present

## 2016-08-19 DIAGNOSIS — Z87891 Personal history of nicotine dependence: Secondary | ICD-10-CM

## 2016-08-19 DIAGNOSIS — Z8489 Family history of other specified conditions: Secondary | ICD-10-CM

## 2016-08-19 DIAGNOSIS — F1910 Other psychoactive substance abuse, uncomplicated: Secondary | ICD-10-CM

## 2016-08-19 DIAGNOSIS — O139 Gestational [pregnancy-induced] hypertension without significant proteinuria, unspecified trimester: Secondary | ICD-10-CM

## 2016-08-19 DIAGNOSIS — Z833 Family history of diabetes mellitus: Secondary | ICD-10-CM | POA: Diagnosis not present

## 2016-08-19 DIAGNOSIS — Z98891 History of uterine scar from previous surgery: Secondary | ICD-10-CM

## 2016-08-19 DIAGNOSIS — O0993 Supervision of high risk pregnancy, unspecified, third trimester: Secondary | ICD-10-CM

## 2016-08-19 HISTORY — PX: TUBAL LIGATION: SHX77

## 2016-08-19 HISTORY — DX: Gestational (pregnancy-induced) hypertension without significant proteinuria, unspecified trimester: O13.9

## 2016-08-19 LAB — PREPARE RBC (CROSSMATCH)

## 2016-08-19 SURGERY — Surgical Case
Anesthesia: Spinal | Site: Abdomen | Wound class: Clean Contaminated

## 2016-08-19 MED ORDER — ONDANSETRON HCL 4 MG/2ML IJ SOLN
4.0000 mg | Freq: Four times a day (QID) | INTRAMUSCULAR | Status: DC | PRN
  Administered 2016-08-19 – 2016-08-20 (×2): 4 mg via INTRAVENOUS
  Filled 2016-08-19 (×2): qty 2

## 2016-08-19 MED ORDER — CEFAZOLIN SODIUM-DEXTROSE 2-4 GM/100ML-% IV SOLN
2.0000 g | INTRAVENOUS | Status: AC
  Administered 2016-08-19: 2 g via INTRAVENOUS

## 2016-08-19 MED ORDER — SCOPOLAMINE 1 MG/3DAYS TD PT72SCOPOLAMINE 1 MG/3DAYS
MEDICATED_PATCH | TRANSDERMAL | Status: DC | PRN
Start: 2016-08-19 — End: 2016-08-19
  Administered 2016-08-19: 1 via TRANSDERMAL

## 2016-08-19 MED ORDER — CEFAZOLIN SODIUM-DEXTROSE 2-3 GM-% IV SOLR
INTRAVENOUS | Status: DC | PRN
  Administered 2016-08-19: 2 g via INTRAVENOUS

## 2016-08-19 MED ORDER — MIDAZOLAM HCL 2 MG/2ML IJ SOLN: 0.5000 mg | Freq: Once | INTRAMUSCULAR | Status: DC | PRN

## 2016-08-19 MED ORDER — SODIUM CHLORIDE 0.9 % IR SOLN
Status: DC | PRN
  Administered 2016-08-19: 1000 mL

## 2016-08-19 MED ORDER — PHENYLEPHRINE HCL 10 MG/ML IJ SOLN
INTRAMUSCULAR | Status: DC | PRN
  Administered 2016-08-19 (×2): 80 ug via INTRAVENOUS
  Administered 2016-08-19: 120 ug via INTRAVENOUS
  Administered 2016-08-19 (×2): 80 ug via INTRAVENOUS

## 2016-08-19 MED ORDER — SIMETHICONE 80 MG PO CHEW
80.0000 mg | CHEWABLE_TABLET | ORAL | Status: DC | PRN
  Administered 2016-08-21: 80 mg via ORAL
  Filled 2016-08-19 (×2): qty 1

## 2016-08-19 MED ORDER — KETOROLAC TROMETHAMINE 30 MG/ML IJ SOLN
30.0000 mg | Freq: Four times a day (QID) | INTRAMUSCULAR | Status: AC | PRN
  Administered 2016-08-20: 30 mg via INTRAVENOUS
  Filled 2016-08-19: qty 1

## 2016-08-19 MED ORDER — LACTATED RINGERS IV SOLN
INTRAVENOUS | Status: DC
  Administered 2016-08-19 (×3): via INTRAVENOUS

## 2016-08-19 MED ORDER — WITCH HAZEL-GLYCERIN EX PADS: 1.0000 "application " | MEDICATED_PAD | CUTANEOUS | Status: DC | PRN

## 2016-08-19 MED ORDER — OXYTOCIN 40 UNITS IN LACTATED RINGERS INFUSION - SIMPLE MED: 2.5000 [IU]/h | INTRAVENOUS | Status: AC

## 2016-08-19 MED ORDER — ONDANSETRON HCL 4 MG/2ML IJ SOLN
INTRAMUSCULAR | Status: DC | PRN
  Administered 2016-08-19: 4 mg via INTRAVENOUS

## 2016-08-19 MED ORDER — PROMETHAZINE HCL 25 MG/ML IJ SOLN: 6.2500 mg | INTRAMUSCULAR | Status: DC | PRN

## 2016-08-19 MED ORDER — LACTATED RINGERS IV SOLN
INTRAVENOUS | Status: DC
  Administered 2016-08-20: 02:00:00 via INTRAVENOUS

## 2016-08-19 MED ORDER — PRENATAL MULTIVITAMIN CH
1.0000 | ORAL_TABLET | Freq: Every day | ORAL | Status: DC
  Administered 2016-08-20 – 2016-08-21 (×2): 1 via ORAL
  Filled 2016-08-19 (×3): qty 1

## 2016-08-19 MED ORDER — DEXAMETHASONE SODIUM PHOSPHATE 4 MG/ML IJ SOLN
INTRAMUSCULAR | Status: DC | PRN
  Administered 2016-08-19: 4 mg via INTRAVENOUS

## 2016-08-19 MED ORDER — MORPHINE SULFATE (PF) 0.5 MG/ML IJ SOLN
INTRAMUSCULAR | Status: DC | PRN
  Administered 2016-08-19: .2 mg via INTRATHECAL

## 2016-08-19 MED ORDER — DIBUCAINE 1 % RE OINT: 1.0000 "application " | TOPICAL_OINTMENT | RECTAL | Status: DC | PRN

## 2016-08-19 MED ORDER — BUPIVACAINE IN DEXTROSE 0.75-8.25 % IT SOLN
INTRATHECAL | Status: DC | PRN
  Administered 2016-08-19: 9 mg via INTRATHECAL

## 2016-08-19 MED ORDER — ACETAMINOPHEN 325 MG PO TABS
650.0000 mg | ORAL_TABLET | ORAL | Status: DC | PRN
  Administered 2016-08-20 – 2016-08-21 (×3): 650 mg via ORAL
  Filled 2016-08-19 (×3): qty 2

## 2016-08-19 MED ORDER — OXYCODONE HCL 5 MG PO TABS
5.0000 mg | ORAL_TABLET | ORAL | Status: DC | PRN
  Administered 2016-08-20 – 2016-08-22 (×5): 5 mg via ORAL
  Filled 2016-08-19 (×5): qty 1

## 2016-08-19 MED ORDER — MORPHINE SULFATE (PF) 4 MG/ML IV SOLN: 1.0000 mg | INTRAVENOUS | Status: DC | PRN

## 2016-08-19 MED ORDER — DEXAMETHASONE SODIUM PHOSPHATE 4 MG/ML IJ SOLN
INTRAMUSCULAR | Status: AC
  Filled 2016-08-19: qty 1

## 2016-08-19 MED ORDER — MORPHINE SULFATE (PF) 0.5 MG/ML IJ SOLN
INTRAMUSCULAR | Status: AC
  Filled 2016-08-19: qty 10

## 2016-08-19 MED ORDER — FENTANYL CITRATE (PF) 100 MCG/2ML IJ SOLN
INTRAMUSCULAR | Status: AC
  Filled 2016-08-19: qty 2

## 2016-08-19 MED ORDER — ONDANSETRON HCL 4 MG/2ML IJ SOLN
INTRAMUSCULAR | Status: AC
  Filled 2016-08-19: qty 2

## 2016-08-19 MED ORDER — PHENYLEPHRINE 8 MG IN D5W 100 ML (0.08MG/ML) PREMIX OPTIME
INJECTION | INTRAVENOUS | Status: DC | PRN
  Administered 2016-08-19: 80 ug/min via INTRAVENOUS

## 2016-08-19 MED ORDER — MENTHOL 3 MG MT LOZG: 1.0000 | LOZENGE | OROMUCOSAL | Status: DC | PRN

## 2016-08-19 MED ORDER — SIMETHICONE 80 MG PO CHEW: 80.0000 mg | CHEWABLE_TABLET | Freq: Three times a day (TID) | ORAL | Status: DC

## 2016-08-19 MED ORDER — DIPHENHYDRAMINE HCL 25 MG PO CAPS
25.0000 mg | ORAL_CAPSULE | Freq: Four times a day (QID) | ORAL | Status: DC | PRN
  Administered 2016-08-20: 25 mg via ORAL
  Filled 2016-08-19 (×2): qty 1

## 2016-08-19 MED ORDER — IBUPROFEN 600 MG PO TABS
600.0000 mg | ORAL_TABLET | Freq: Four times a day (QID) | ORAL | Status: DC
  Administered 2016-08-20 – 2016-08-22 (×9): 600 mg via ORAL
  Filled 2016-08-19 (×11): qty 1

## 2016-08-19 MED ORDER — COCONUT OIL OIL: 1.0000 "application " | TOPICAL_OIL | Status: DC | PRN

## 2016-08-19 MED ORDER — SENNOSIDES-DOCUSATE SODIUM 8.6-50 MG PO TABS
2.0000 | ORAL_TABLET | ORAL | Status: DC
  Filled 2016-08-19: qty 2

## 2016-08-19 MED ORDER — OXYCODONE HCL 5 MG PO TABS: 10.0000 mg | ORAL_TABLET | ORAL | Status: DC | PRN

## 2016-08-19 MED ORDER — TETANUS-DIPHTH-ACELL PERTUSSIS 5-2.5-18.5 LF-MCG/0.5 IM SUSP: 0.5000 mL | Freq: Once | INTRAMUSCULAR | Status: DC

## 2016-08-19 MED ORDER — FENTANYL CITRATE (PF) 100 MCG/2ML IJ SOLN
INTRAMUSCULAR | Status: DC | PRN
  Administered 2016-08-19: 10 ug via INTRATHECAL

## 2016-08-19 MED ORDER — SIMETHICONE 80 MG PO CHEW
80.0000 mg | CHEWABLE_TABLET | ORAL | Status: DC
  Filled 2016-08-19: qty 1

## 2016-08-19 MED ORDER — KETOROLAC TROMETHAMINE 30 MG/ML IJ SOLN
30.0000 mg | Freq: Four times a day (QID) | INTRAMUSCULAR | Status: DC | PRN
  Administered 2016-08-19: 30 mg via INTRAMUSCULAR

## 2016-08-19 MED ORDER — KETOROLAC TROMETHAMINE 30 MG/ML IJ SOLN
INTRAMUSCULAR | Status: AC
  Administered 2016-08-19: 30 mg via INTRAMUSCULAR
  Filled 2016-08-19: qty 1

## 2016-08-19 MED ORDER — MEPERIDINE HCL 25 MG/ML IJ SOLN: 6.2500 mg | INTRAMUSCULAR | Status: DC | PRN

## 2016-08-19 SURGICAL SUPPLY — 33 items
APL SKNCLS STERI-STRIP NONHPOA (GAUZE/BANDAGES/DRESSINGS) ×2
BENZOIN TINCTURE PRP APPL 2/3 (GAUZE/BANDAGES/DRESSINGS) ×1 IMPLANT
BRR ADH 6X5 SEPRAFILM 1 SHT (MISCELLANEOUS)
CLAMP CORD UMBIL (MISCELLANEOUS) IMPLANT
CLOSURE STERI STRIP 1/2 X4 (GAUZE/BANDAGES/DRESSINGS) ×1 IMPLANT
CONTAINER PREFILL 10% NBF 15ML (MISCELLANEOUS) IMPLANT
DRSG OPSITE POSTOP 4X10 (GAUZE/BANDAGES/DRESSINGS) ×3 IMPLANT
DURAPREP 26ML APPLICATOR (WOUND CARE) ×3 IMPLANT
ELECT REM PT RETURN 9FT ADLT (ELECTROSURGICAL) ×3
ELECTRODE REM PT RTRN 9FT ADLT (ELECTROSURGICAL) ×2 IMPLANT
EXTRACTOR VACUUM M CUP 4 TUBE (SUCTIONS) IMPLANT
GLOVE BIOGEL PI IND STRL 6.5 (GLOVE) ×2 IMPLANT
GLOVE BIOGEL PI IND STRL 7.0 (GLOVE) ×2 IMPLANT
GLOVE BIOGEL PI INDICATOR 6.5 (GLOVE) ×1
GLOVE BIOGEL PI INDICATOR 7.0 (GLOVE) ×1
GLOVE SURG SS PI 6.0 STRL IVOR (GLOVE) ×3 IMPLANT
GOWN STRL REUS W/TWL LRG LVL3 (GOWN DISPOSABLE) ×6 IMPLANT
KIT ABG SYR 3ML LUER SLIP (SYRINGE) IMPLANT
NDL HYPO 25X5/8 SAFETYGLIDE (NEEDLE) IMPLANT
NEEDLE HYPO 25X5/8 SAFETYGLIDE (NEEDLE) IMPLANT
NS IRRIG 1000ML POUR BTL (IV SOLUTION) ×3 IMPLANT
PACK C SECTION WH (CUSTOM PROCEDURE TRAY) ×3 IMPLANT
PAD ABD 7.5X8 STRL (GAUZE/BANDAGES/DRESSINGS) ×1 IMPLANT
PAD OB MATERNITY 4.3X12.25 (PERSONAL CARE ITEMS) ×3 IMPLANT
PENCIL SMOKE EVAC W/HOLSTER (ELECTROSURGICAL) ×3 IMPLANT
RTRCTR C-SECT PINK 25CM LRG (MISCELLANEOUS) IMPLANT
SEPRAFILM MEMBRANE 5X6 (MISCELLANEOUS) IMPLANT
SPONGE GAUZE 4X4 12PLY STER LF (GAUZE/BANDAGES/DRESSINGS) ×1 IMPLANT
SUT PLAIN 0 NONE (SUTURE) ×3 IMPLANT
SUT VIC AB 0 CT1 36 (SUTURE) ×12 IMPLANT
SUT VIC AB 4-0 KS 27 (SUTURE) ×3 IMPLANT
TOWEL OR 17X24 6PK STRL BLUE (TOWEL DISPOSABLE) ×3 IMPLANT
TRAY FOLEY CATH SILVER 14FR (SET/KITS/TRAYS/PACK) ×3 IMPLANT
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
## 2016-08-19 NOTE — Lactation Note (Signed)
This note was copied from a baby's chart. Lactation Consultation Note RN reports mom has changed to formula feedings.  Patient Name: Joann King ZOXWR'UToday's Date: 08/19/2016     Maternal Data    Feeding Feeding Type: Bottle Fed - Formula Nipple Type: Slow - flow Length of feed: 5 min  LATCH Score/Interventions Latch: Repeated attempts needed to sustain latch, nipple held in mouth throughout feeding, stimulation needed to elicit sucking reflex. Intervention(s): Assist with latch;Breast compression  Audible Swallowing: None Intervention(s): Skin to skin  Type of Nipple: Flat Intervention(s): No intervention needed  Comfort (Breast/Nipple): Soft / non-tender     Hold (Positioning): Assistance needed to correctly position infant at breast and maintain latch. Intervention(s): Breastfeeding basics reviewed;Skin to skin  LATCH Score: 5  Lactation Tools Discussed/Used     Consult Status      Shoptaw, Arvella MerlesJana Lynn 08/19/2016, 10:08 PM

## 2016-08-19 NOTE — Anesthesia Procedure Notes (Signed)
Spinal  Patient location during procedure: OR End time: 08/19/2016 5:11 PM Staffing Anesthesiologist: Jairo BenJACKSON, Heatherly Stenner Performed: anesthesiologist  Preanesthetic Checklist Completed: patient identified, surgical consent, pre-op evaluation, timeout performed, IV checked, risks and benefits discussed and monitors and equipment checked Spinal Block Patient position: sitting Prep: site prepped and draped and DuraPrep Patient monitoring: blood pressure, continuous pulse ox, cardiac monitor and heart rate Approach: midline Location: L3-4 Injection technique: single-shot Needle Needle type: Pencan  Needle gauge: 24 G Needle length: 9 cm Assessment Sensory level: T4 Additional Notes Pt identified in Operating room.  Monitors applied. Working IV access confirmed. Sterile prep, drape lumbar spine.  1% lido local L 3,4.  #24ga Pencan into clear CSF L 3,4.  9mg  0.75% Bupivacaine with dextrose, fentanyl, morphine injected with asp CSF beginning and end of injection.  Patient asymptomatic, VSS, no heme aspirated, tolerated well.  Sandford Craze Gisela Lea, MD

## 2016-08-19 NOTE — Anesthesia Postprocedure Evaluation (Signed)
Anesthesia Post Note  Patient: Joann King  Procedure(s) Performed: Procedure(s) (LRB): CESAREAN SECTION (N/A) BILATERAL TUBAL LIGATION (Bilateral)  Patient location during evaluation: PACU Anesthesia Type: Spinal Level of consciousness: awake and alert, oriented and patient cooperative Pain management: pain level controlled Vital Signs Assessment: post-procedure vital signs reviewed and stable Respiratory status: spontaneous breathing, nonlabored ventilation and respiratory function stable Cardiovascular status: blood pressure returned to baseline and stable Postop Assessment: spinal receding, patient able to bend at knees, no headache and no signs of nausea or vomiting Anesthetic complications: no        Last Vitals:  Vitals:   08/19/16 1932 08/19/16 1933  BP: 95/71 (!) 104/57  Pulse: 87 88  Resp: 18 (!) 21  Temp:  36.4 C    Last Pain:  Vitals:   08/19/16 1933  TempSrc:   PainSc: 3    Pain Goal:                 Jansen Sciuto,E. Magdelene Ruark

## 2016-08-19 NOTE — H&P (Signed)
Joann King is a 26 y.o. female [phone] at 937 weeks presenting for scheduled cesarean section secondary to gestational hypertension. Patient transferred care from the health department at around 33 weeks when HTN was noted. She has a history of 2 previous c-section and left salpingectomy due to ectopic pregnancy. Patient is without complaints. She reports good fetal movement. She denies vaginal bleeding, leakage of fluid or contractions.  OB History    Gravida Para Term Preterm AB Living   5 2 2   2 2    SAB TAB Ectopic Multiple Live Births     1 1   2      Past Medical History:  Diagnosis Date  . Anemia    during first pregnancy  . Ectopic pregnancy, tubal   . Heartburn in pregnancy   . Herpes genitalia    doesn't think ever had outbreak  . Pregnancy induced hypertension    Past Surgical History:  Procedure Laterality Date  . CESAREAN SECTION  2009  . CESAREAN SECTION N/A 10/07/2013   Procedure: CESAREAN SECTION;  Surgeon: Lesly DukesKelly H Leggett, MD;  Location: [facility];  Service: Obstetrics;  Laterality: N/A;  . DIAGNOSTIC LAPAROSCOPY WITH REMOVAL OF ECTOPIC PREGNANCY Left 08/02/2015   Procedure: DIAGNOSTIC LAPAROSCOPY WITH REMOVAL OF ECTOPIC PREGNANCY;  Surgeon: Catalina AntiguaPeggy Hugh Garrow, MD;  Location: [facility];  Service: Gynecology;  Laterality: Left;  for ectopic    Family History: family history includes Diabetes in her maternal grandmother; Hypertension in her maternal grandmother. Social History:  reports that she has quit smoking. Her smoking use included Cigarettes. She smoked 0.50 packs per day. She has never used smokeless tobacco. She reports that she drinks alcohol. She reports that she does not use drugs.     Maternal Diabetes: No Genetic Screening: Normal Maternal Ultrasounds/Referrals: Normal Fetal Ultrasounds or other Referrals:  None Maternal Substance Abuse:  No Significant Maternal Medications:  None Significant Maternal Lab Results:  None Other Comments:  None  ROS  See  pertinent in HPI History   Blood pressure 126/72, pulse 99, temperature 98.9 F (37.2 C), temperature source Oral, resp. rate 18, height 5\' 6"  (1.676 m), weight 235 lb (106.6 kg), last menstrual period 12/04/2015, SpO2 100 %. Exam Physical Exam  GENERAL: Well-developed, well-nourished female in no acute distress.  LUNGS: Clear to auscultation bilaterally.  HEART: Regular rate and rhythm. ABDOMEN: Soft, nontender, gravid PELVIC: Not indicated EXTREMITIES: No cyanosis, clubbing, or edema, 2+ distal pulses.  Prenatal labs: ABO, Rh: --/--/O POS (02/15 1110) Antibody: NEG (02/15 1110) Rubella: Immune (09/18 0000) RPR: Non Reactive (02/15 1110)  HBsAg: Negative (09/18 0000)  HIV: Non-reactive (12/29 0000)  GBS:   positive  Assessment/Plan: 26 yo G5P2022 at 37 weeks with gestational hypertension here for repeat cesarean section and bilateral tubal ligation - Risks and benefits reviewed and explained to the patient including but not limited to risks of bleeding, infection and damage to adjacent organs. Patient verbalized understanding - Patient also desires permanent sterilization. Risks and benefits of procedure discussed with patient including permanence of method, bleeding, infection, injury to surrounding organs and need for additional procedures. Risk failure of 0.5-1% with increased risk of ectopic gestation if pregnancy occurs was also discussed with patient. Patient verbalized understanding and all questions were answered. Consent signed    Emmaline Wahba 08/19/2016, 4:41 PM

## 2016-08-19 NOTE — Transfer of Care (Deleted)
Immediate Anesthesia Transfer of Care Note  Patient: Joann GammonDiesha M King  Procedure(s) Performed: Procedure(s): CESAREAN SECTION (N/A) BILATERAL TUBAL LIGATION (Bilateral)  Patient Location: PACU  Anesthesia Type:Spinal  Level of Consciousness: awake  Airway & Oxygen Therapy: Patient Spontanous Breathing  Post-op Assessment: Report given to RN  Post vital signs: Reviewed and stable  Last Vitals:  Vitals:   08/19/16 1532  BP: 126/72  Pulse: 99  Resp: 18  Temp: 37.2 C    Last Pain:  Vitals:   08/19/16 1532  TempSrc: Oral  PainSc: 0-No pain         Complications: persistent nausea and vomiting

## 2016-08-19 NOTE — Op Note (Addendum)
Joann Climesiesha Burnell BlanksM Joann King PROCEDURE DATE: 08/19/2016  PREOPERATIVE DIAGNOSIS: Intrauterine pregnancy at  5843w0d weeks gestation with gestational hypertension; previous uterine incision kerr x2 and Undesired fertility  POSTOPERATIVE DIAGNOSIS: The same  PROCEDURE:     Cesarean Section and Tubal Sterilization using Pomeroy method   SURGEON:  Dr. Gigi GinPeggy Jahon Bart  ASSISTANT: none  INDICATIONS: Joann King is a 26 y.o. [id number] at 9343w0d with gestational hypertension scheduled for cesarean section secondary to previous uterine incision kerr x2.  The risks of cesarean section discussed with the patient included but were not limited to: bleeding which may require transfusion or reoperation; infection which may require antibiotics; injury to bowel, bladder, ureters or other surrounding organs; injury to the fetus; need for additional procedures including hysterectomy in the event of a life-threatening hemorrhage; placental abnormalities wth subsequent pregnancies, incisional problems, thromboembolic phenomenon and other postoperative/anesthesia complications. Patient also desires permanent sterilization. Risks and benefits of procedure discussed with patient including permanence of method, bleeding, infection, injury to surrounding organs and need for additional procedures. Risk failure of 0.5-1% with increased risk of ectopic gestation if pregnancy occurs was also discussed with patient. The patient concurred with the proposed plan, giving informed written consent for the procedure.    FINDINGS:  Viable female infant in cephalic presentation.  Apgars 5 and 8, and 9. Cord gas 7.10.  Clear amniotic fluid.  Intact placenta, three vessel cord.  Normal uterus, right fallopian tube and ovaries bilaterally.  ANESTHESIA:    Spinal INTRAVENOUS FLUIDS:3500 ml ESTIMATED BLOOD LOSS: 800 ml URINE OUTPUT:  50 ml SPECIMENS: Placenta sent to pathology COMPLICATIONS: None immediate  PROCEDURE IN DETAIL:  The patient received  intravenous antibiotics and had sequential compression devices applied to her lower extremities while in the preoperative area.  She was then taken to the operating room where anesthesia was induced and was found to be adequate. A foley catheter was placed into her bladder and attached to Joann King gravity. She was then placed in a dorsal supine position with a leftward tilt, and prepped and draped in a sterile manner. After an adequate timeout was performed, a Pfannenstiel skin incision was made with scalpel and carried through to the underlying layer of fascia. The fascia was incised in the midline and this incision was extended bilaterally using the Mayo scissors. Kocher clamps were applied to the superior aspect of the fascial incision and the underlying rectus muscles were dissected off bluntly. A similar process was carried out on the inferior aspect of the facial incision. The rectus muscles were separated in the midline bluntly and the peritoneum was entered bluntly. Adhesions involving the uterus and anterior abdominal wall where carefully lysed. The Alexis self-retaining retractor was introduced into the abdominal cavity. Attention was turned to the lower uterine segment where a transverse hysterotomy was made with a scalpel and extended bilaterally bluntly. With the assistance of a vacuum (4 attempts were made), the infant was successfully delivered, and cord was clamped and cut and infant was handed over to awaiting neonatology team. Uterine massage was then administered and the placenta delivered intact with three-vessel cord. The uterus was cleared of clot and debris.  The hysterotomy was closed with 0 Vicryl in a running locked fashion, and an imbricating layer was also placed with a 0 Vicryl. Overall, excellent hemostasis was noted. The pelvis copiously irrigated and cleared of all clot and debris. What appeared to be a remnant of the patient's left fallopian tube was identified, brought to the  incision, and grasped with  a Babcock clamp. The tube was then followed out to the fimbria. The Babcock clamp was then used to grasp the tube approximately 4 cm from the cornual region. A 3 cm segment of the tube was then ligated with free tie of plain gut suture, transected and excised. Good hemostasis was noted and the tube was returned to the abdomen. The right fallopian tube was then identified to its fimbriated end, ligated, and a 3 cm segment excised in a similar fashion. Excellent hemostasis was noted, and the tube returned to the abdomen.  Hemostasis was confirmed on all surfaces.  The peritoneum and the muscles were reapproximated using 0 vicryl interrupted stitches. The fascia was then closed using 0 Vicryl in a running fashion.  The skin was closed in a subcuticular fashion using 3.0 Vicryl. The patient tolerated the procedure well. Sponge, lap, instrument and needle counts were correct x 2. She was taken to the recovery room in stable condition.    Joann Rolison ConstantMD  08/19/2016 6:21 PM

## 2016-08-19 NOTE — Transfer of Care (Signed)
Immediate Anesthesia Transfer of Care Note  Patient: Joann King  Procedure(s) Performed: Procedure(s): CESAREAN SECTION (N/A) BILATERAL TUBAL LIGATION (Bilateral)  Patient Location: PACU  Anesthesia Type:Spinal  Level of Consciousness: awake, alert  and oriented  Airway & Oxygen Therapy: Patient Spontanous Breathing  Post-op Assessment: Report given to RN and Post -op Vital signs reviewed and stable  Post vital signs: Reviewed and stable  Last Vitals:  Vitals:   08/19/16 1532 08/19/16 1833  BP: 126/72   Pulse: 99 (!) 103  Resp: 18 18  Temp: 37.2 C 36.4 C    Last Pain:  Vitals:   08/19/16 1833  TempSrc: Oral  PainSc:          Complications: No apparent anesthesia complications

## 2016-08-19 NOTE — Anesthesia Preprocedure Evaluation (Signed)
Anesthesia Evaluation  Patient identified by MRN, date of birth, ID band Patient awake    Reviewed: Allergy & Precautions, NPO status , Patient's Chart, lab work & pertinent test results  Airway Mallampati: II  TM Distance: >3 FB Neck ROM: Full    Dental  (+) Missing, Dental Advisory Given   Pulmonary former smoker,    breath sounds clear to auscultation       Cardiovascular hypertension (PIH, no meds),  Rhythm:Regular Rate:Normal     Neuro/Psych negative neurological ROS     GI/Hepatic Neg liver ROS, GERD  Medicated,  Endo/Other  Morbid obesity  Renal/GU negative Renal ROS     Musculoskeletal   Abdominal (+) + obese,   Peds  Hematology plt 343K, Hb 10.0   Anesthesia Other Findings   Reproductive/Obstetrics (+) Pregnancy                             Anesthesia Physical Anesthesia Plan  ASA: II  Anesthesia Plan: Spinal   Post-op Pain Management:    Induction:   Airway Management Planned: Natural Airway  Additional Equipment:   Intra-op Plan:   Post-operative Plan:   Informed Consent: I have reviewed the patients History and Physical, chart, labs and discussed the procedure including the risks, benefits and alternatives for the proposed anesthesia with the patient or authorized representative who has indicated his/her understanding and acceptance.   Dental advisory given  Plan Discussed with: CRNA  Anesthesia Plan Comments: (Plan routine monitors, SAB)        Anesthesia Quick Evaluation

## 2016-08-19 NOTE — Progress Notes (Signed)
Baby heartrate dopplered from [phone]. Range from 127-131. Daaiyah Baumert GreenwoodBrown Tiffaney Heimann, [place] 08/19/2016 4:37 PM

## 2016-08-19 NOTE — Consult Note (Signed)
Neonatology Note:   Attendance at C-section:    I was asked by Dr. Jolayne Pantheronstant to attend this repeat C/S at 37 0/7 weeks due to gestational hypertension. The mother is a G5P2A2 O pos, GBS pos with gestational HTN, on no medication. ROM at delivery, fluid clear. Difficult extraction, multiple vacuum attempts. Infant floppy with no spontaneous cry and passing terminal meconium at birth. Delayed cord clamping was not done. We bulb suctioned and gave stimulation. HR was always > 100 and color was good, although there was marked bruising of his face. He gradually had increased respiratory effort, crying out loud at 3 minutes. Tone returned more slowly, but he was having spontaneous movement of his arms by about 7 minutes. Ap 5/8/9. Lungs clear to ausc in DR. To CN to care of Pediatrician.   Joann Souhristie C. Gilberto Streck, MD

## 2016-08-20 LAB — CBC
HCT: 28.9 % — ABNORMAL LOW (ref 36.0–46.0)
HEMOGLOBIN: 9.9 g/dL — AB (ref 12.0–15.0)
MCH: 28.7 pg (ref 26.0–34.0)
MCHC: 34.3 g/dL (ref 30.0–36.0)
MCV: 83.8 fL (ref 78.0–100.0)
PLATELETS: 344 10*3/uL (ref 150–400)
RBC: 3.45 MIL/uL — ABNORMAL LOW (ref 3.87–5.11)
RDW: 14 % (ref 11.5–15.5)
WBC: 17.8 10*3/uL — ABNORMAL HIGH (ref 4.0–10.5)

## 2016-08-20 MED ORDER — IBUPROFEN 600 MG PO TABS: 600.0000 mg | ORAL_TABLET | Freq: Four times a day (QID) | ORAL | Status: DC

## 2016-08-20 MED ORDER — DIPHENHYDRAMINE HCL 50 MG/ML IJ SOLN: 12.5000 mg | INTRAMUSCULAR | Status: DC | PRN

## 2016-08-20 MED ORDER — TETANUS-DIPHTH-ACELL PERTUSSIS 5-2.5-18.5 LF-MCG/0.5 IM SUSP: 0.5000 mL | Freq: Once | INTRAMUSCULAR | Status: DC

## 2016-08-20 MED ORDER — SCOPOLAMINE 1 MG/3DAYS TD PT72
1.0000 | MEDICATED_PATCH | Freq: Once | TRANSDERMAL | Status: DC
  Filled 2016-08-20: qty 1

## 2016-08-20 MED ORDER — SENNOSIDES-DOCUSATE SODIUM 8.6-50 MG PO TABS
2.0000 | ORAL_TABLET | ORAL | Status: DC
  Administered 2016-08-21 (×2): 2 via ORAL
  Filled 2016-08-20 (×2): qty 2

## 2016-08-20 MED ORDER — DIBUCAINE 1 % RE OINT: 1.0000 "application " | TOPICAL_OINTMENT | RECTAL | Status: DC | PRN

## 2016-08-20 MED ORDER — ACETAMINOPHEN 325 MG PO TABS: 650.0000 mg | ORAL_TABLET | ORAL | Status: DC | PRN

## 2016-08-20 MED ORDER — OXYTOCIN 40 UNITS IN LACTATED RINGERS INFUSION - SIMPLE MED: 2.5000 [IU]/h | INTRAVENOUS | Status: DC

## 2016-08-20 MED ORDER — WITCH HAZEL-GLYCERIN EX PADS: 1.0000 "application " | MEDICATED_PAD | CUTANEOUS | Status: DC | PRN

## 2016-08-20 MED ORDER — NALBUPHINE HCL 10 MG/ML IJ SOLN: 5.0000 mg | Freq: Once | INTRAMUSCULAR | Status: DC | PRN

## 2016-08-20 MED ORDER — MENTHOL 3 MG MT LOZG: 1.0000 | LOZENGE | OROMUCOSAL | Status: DC | PRN

## 2016-08-20 MED ORDER — DIPHENHYDRAMINE HCL 25 MG PO CAPS: 25.0000 mg | ORAL_CAPSULE | ORAL | Status: DC | PRN

## 2016-08-20 MED ORDER — DEXTROSE 5 % IV SOLN
1.0000 ug/kg/h | INTRAVENOUS | Status: DC | PRN
  Filled 2016-08-20: qty 2

## 2016-08-20 MED ORDER — COCONUT OIL OIL: 1.0000 "application " | TOPICAL_OIL | Status: DC | PRN

## 2016-08-20 MED ORDER — ONDANSETRON HCL 4 MG/2ML IJ SOLN: 4.0000 mg | Freq: Three times a day (TID) | INTRAMUSCULAR | Status: DC | PRN

## 2016-08-20 MED ORDER — OXYCODONE HCL 5 MG PO TABS: 5.0000 mg | ORAL_TABLET | ORAL | Status: DC | PRN

## 2016-08-20 MED ORDER — DIPHENHYDRAMINE HCL 25 MG PO CAPS: 25.0000 mg | ORAL_CAPSULE | Freq: Four times a day (QID) | ORAL | Status: DC | PRN

## 2016-08-20 MED ORDER — LACTATED RINGERS IV SOLN: INTRAVENOUS | Status: DC

## 2016-08-20 MED ORDER — SODIUM CHLORIDE 0.9% FLUSH: 3.0000 mL | INTRAVENOUS | Status: DC | PRN

## 2016-08-20 MED ORDER — OXYCODONE HCL 5 MG PO TABS: 10.0000 mg | ORAL_TABLET | ORAL | Status: DC | PRN

## 2016-08-20 MED ORDER — ZOLPIDEM TARTRATE 5 MG PO TABS: 5.0000 mg | ORAL_TABLET | Freq: Every evening | ORAL | Status: DC | PRN

## 2016-08-20 MED ORDER — PRENATAL MULTIVITAMIN CH: 1.0000 | ORAL_TABLET | Freq: Every day | ORAL | Status: DC

## 2016-08-20 MED ORDER — NALOXONE HCL 0.4 MG/ML IJ SOLN: 0.4000 mg | INTRAMUSCULAR | Status: DC | PRN

## 2016-08-20 MED ORDER — SIMETHICONE 80 MG PO CHEW
80.0000 mg | CHEWABLE_TABLET | Freq: Three times a day (TID) | ORAL | Status: DC
  Administered 2016-08-20 – 2016-08-22 (×6): 80 mg via ORAL
  Filled 2016-08-20 (×4): qty 1

## 2016-08-20 MED ORDER — SIMETHICONE 80 MG PO CHEW: 80.0000 mg | CHEWABLE_TABLET | ORAL | Status: DC | PRN

## 2016-08-20 MED ORDER — SIMETHICONE 80 MG PO CHEW: 80.0000 mg | CHEWABLE_TABLET | ORAL | Status: DC

## 2016-08-20 MED ORDER — NALBUPHINE HCL 10 MG/ML IJ SOLN: 5.0000 mg | INTRAMUSCULAR | Status: DC | PRN

## 2016-08-20 NOTE — Progress Notes (Signed)
Subjective: Postpartum Day 1: Cesarean Delivery Patient reports incisional pain, tolerating PO and no problems voiding.    Objective: Vital signs in last 24 hours: Temp:  [97.5 F (36.4 C)-98.9 F (37.2 C)] 97.9 F (36.6 C) (02/18 0745) Pulse Rate:  [71-103] 71 (02/18 0745) Resp:  [13-21] 18 (02/18 0745) BP: (95-146)/(54-80) 104/80 (02/18 0745) SpO2:  [94 %-100 %] 99 % (02/18 0745) Weight:  [235 lb (106.6 kg)] 235 lb (106.6 kg) (02/17 1532)  Physical Exam:  General: alert, cooperative, appears stated age and no distress Lochia: appropriate Uterine Fundus: firm Incision: no significant drainage, no dehiscence, no significant erythema DVT Evaluation: No evidence of DVT seen on physical exam.   Recent Labs  08/17/16 1110 08/20/16 0559  HGB 10.0* 9.9*  HCT 29.0* 28.9*    Assessment/Plan: Status post Cesarean section. Doing well postoperatively.  Continue current care.  Joann King 08/20/2016, 9:20 AM

## 2016-08-20 NOTE — Addendum Note (Signed)
Addendum  created 08/20/16 0758 by Lewie LoronJohn Nychelle Cassata, MD   Order list changed, Order sets accessed

## 2016-08-20 NOTE — Addendum Note (Signed)
Addendum  created 08/20/16 [id number] by Algis GreenhouseLinda A Lenorris Karger, CRNA   Sign clinical note

## 2016-08-20 NOTE — Progress Notes (Signed)
Pt tolerated getting up out of bed.  0600: pt up to bathroom without problems.Unable to void. pericare given and explained

## 2016-08-20 NOTE — Anesthesia Postprocedure Evaluation (Signed)
Anesthesia Post Note  Patient: Joann King  Procedure(s) Performed: Procedure(s) (LRB): CESAREAN SECTION (N/A) BILATERAL TUBAL LIGATION (Bilateral)  Patient location during evaluation: Mother Baby Anesthesia Type: Spinal Level of consciousness: awake Pain management: satisfactory to patient Vital Signs Assessment: post-procedure vital signs reviewed and stable Respiratory status: spontaneous breathing Cardiovascular status: stable Anesthetic complications: no        Last Vitals:  Vitals:   08/20/16 0600 08/20/16 0745  BP: 114/77 104/80  Pulse: 74 71  Resp: 18 18  Temp: 36.7 C 36.6 C    Last Pain:  Vitals:   08/20/16 0745  TempSrc: Oral  PainSc: 3    Pain Goal: Patients Stated Pain Goal: 3 (08/20/16 0745)               Cephus ShellingBURGER,Drake Wuertz

## 2016-08-21 ENCOUNTER — Encounter (HOSPITAL_COMMUNITY): Payer: Self-pay | Admitting: Obstetrics and Gynecology

## 2016-08-21 LAB — CBC
HCT: 25.4 % — ABNORMAL LOW (ref 36.0–46.0)
HEMOGLOBIN: 8.6 g/dL — AB (ref 12.0–15.0)
MCH: 28.9 pg (ref 26.0–34.0)
MCHC: 33.9 g/dL (ref 30.0–36.0)
MCV: 85.2 fL (ref 78.0–100.0)
PLATELETS: 343 10*3/uL (ref 150–400)
RBC: 2.98 MIL/uL — AB (ref 3.87–5.11)
RDW: 14.2 % (ref 11.5–15.5)
WBC: 13.9 10*3/uL — ABNORMAL HIGH (ref 4.0–10.5)

## 2016-08-21 LAB — TYPE AND SCREEN
BLOOD PRODUCT EXPIRATION DATE: 201803172359
Blood Product Expiration Date: 201803172359
Unit Type and Rh: 5100
Unit Type and Rh: 5100

## 2016-08-21 MED ORDER — NALBUPHINE HCL 10 MG/ML IJ SOLN: 5.0000 mg | Freq: Once | INTRAMUSCULAR | Status: DC | PRN

## 2016-08-21 MED ORDER — NALBUPHINE HCL 10 MG/ML IJ SOLN
5.0000 mg | Freq: Once | INTRAMUSCULAR | Status: DC | PRN
Start: 2016-08-21 — End: 2016-08-22

## 2016-08-21 MED ORDER — NALBUPHINE HCL 10 MG/ML IJ SOLN: 5.0000 mg | INTRAMUSCULAR | Status: DC | PRN

## 2016-08-21 NOTE — Plan of Care (Signed)
Problem: Skin Integrity: Goal: Demonstration of wound healing without infection will improve Pressure dressing still on incision. Encouraged patient to shower and get tape around dressing wet and remove dressing. Encouraged to leave honeycomb on incision. Discussed with patient that if she has trouble removing dressing, get tape wet and once back in bed to call RN to remove pressure dressing. Patient verbalized understanding.

## 2016-08-21 NOTE — Progress Notes (Signed)
Subjective: Postpartum Day 2: Cesarean Delivery Patient reports incisional pain, tolerating PO, + flatus and no problems voiding.    Objective: Vital signs in last 24 hours: Temp:  [97.8 F (36.6 C)-98.1 F (36.7 C)] 97.8 F (36.6 C) (02/19 0500) Pulse Rate:  [71-76] 71 (02/19 0500) Resp:  [18] 18 (02/19 0500) BP: (104-127)/(63-80) 113/63 (02/19 0500) SpO2:  [98 %-100 %] 100 % (02/18 1535)  Physical Exam:  General: alert, cooperative, appears stated age and no distress Lochia: appropriate Uterine Fundus: firm Incision: healing well, no significant drainage, no dehiscence, no significant erythema DVT Evaluation: No evidence of DVT seen on physical exam.   Recent Labs  08/20/16 0559 08/21/16 0532  HGB 9.9* 8.6*  HCT 28.9* 25.4*    Assessment/Plan: Status post Cesarean section. Doing well postoperatively.  Continue current care.  Wyvonnia DuskyMarie Jancarlos Thrun 08/21/2016, 7:13 AM
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
## 2016-08-21 NOTE — Plan of Care (Signed)
Problem: Activity: Goal: Ability to tolerate increased activity will improve Encouraged patient to ambulate in hallway three times today.

## 2016-08-22 MED ORDER — IBUPROFEN 600 MG PO TABS: 600.0000 mg | ORAL_TABLET | Freq: Four times a day (QID) | ORAL | 0 refills | Status: DC

## 2016-08-22 MED ORDER — OXYCODONE HCL 5 MG PO TABS: 5.0000 mg | ORAL_TABLET | ORAL | 0 refills | Status: DC | PRN

## 2016-08-22 NOTE — Discharge Summary (Signed)
Obstetric Discharge Summary Reason for Admission: Scheduled repeat (third) cesarean and BTL Prenatal Procedures: ultrasound Intrapartum Procedures: cesarean: low cervical, transverse and tubal ligation Postpartum Procedures: none Complications-Operative and Postpartum: none Hemoglobin  Date Value Ref Range Status  08/21/2016 8.6 (L) 12.0 - 15.0 g/dL Final  60/45/409807/15/2017 11.911.7 g/dL Final   HCT  Date Value Ref Range Status  08/21/2016 25.4 (L) 36.0 - 46.0 % Final  01/15/2016 33 % Final    Physical Exam:  General: alert Lochia: appropriate Uterine Fundus: firm and NT at U-2 Honeycomb dressing: c/d/i DVT Evaluation: No evidence of DVT seen on physical exam.  Discharge Diagnoses: Term Pregnancy-delivered  Discharge Information: Date: 08/22/2016 Activity: pelvic rest Diet: routine Medications: None Condition: stable Instructions: refer to practice specific booklet Discharge to: home Follow-up Information    Scheryl DarterJames Arnold, MD. Schedule an appointment as soon as possible for a visit in 6 week(s).   Specialty:  Obstetrics and Gynecology Contact information: [address] [place] [address] [phone]           Newborn Data: Live born female  Birth Weight: 6 lb 7.7 oz (2940 g) APGAR: 5, 8  Home with mother.  Jasia Hiltunen C Delories Mauri 08/22/2016, 8:20 AM

## 2016-08-22 NOTE — Discharge Instructions (Signed)
Cesarean Delivery, Care After Refer to this sheet in the next few weeks. These instructions provide you with information about caring for yourself after your procedure. Your health care provider may also give you more specific instructions. Your treatment has been planned according to current medical practices, but problems sometimes occur. Call your health care provider if you have any problems or questions after your procedure. What can I expect after the procedure? After the procedure, it is common to have:  A small amount of blood or clear fluid coming from the incision.  Some redness, swelling, and pain in your incision area.  Some abdominal pain and soreness.  Vaginal bleeding (lochia).  Pelvic cramps.  Fatigue. Follow these instructions at home: Incision care  Follow instructions from your health care provider about how to take care of your incision. Make sure you:  Wash your hands with soap and water before you change your bandage (dressing). If soap and water are not available, use hand sanitizer.  Change your dressing as told by your health care provider.  Leave stitches (sutures), skin staples, skin glue, or adhesive strips in place. These skin closures may need to stay in place for 2 weeks or longer. If adhesive strip edges start to loosen and curl up, you may trim the loose edges. Do not remove adhesive strips completely unless your health care provider tells you to do that.  Check your incision area every day for signs of infection. Check for:  More redness, swelling, or pain.  More fluid or blood.  Warmth.  Pus or a bad smell.  When you cough or sneeze, hug a pillow. This helps with pain and decreases the chance of your incision opening up (dehiscing). Do this until your incision heals. Medicines  Take over-the-counter and prescription medicines only as told by your health care provider.  If you were prescribed an antibiotic medicine, take it as told by your  health care provider. Do not stop taking the antibiotic until it is finished. Driving  Do not drive or operate heavy machinery while taking prescription pain medicine.  Do not drive for 24 hours if you received a sedative. Lifestyle  Do not drink alcohol. This is especially important if you are breastfeeding or taking pain medicine.  Do not use tobacco products, including cigarettes, chewing tobacco, or e-cigarettes. If you need help quitting, ask your health care provider. Tobacco can delay wound healing. Eating and drinking  Drink at least 8 eight-ounce glasses of water every day unless told not to by your health care provider. If you breastfeed, you may need to drink more water than this.  Eat high-fiber foods every day. These foods may help prevent or relieve constipation. High-fiber foods include:  Whole grain cereals and breads.  Brown rice.  Beans.  Fresh fruits and vegetables. Activity  Return to your normal activities as told by your health care provider. Ask your health care provider what activities are safe for you.  Rest as much as possible. Try to rest or take a nap while your baby is sleeping.  Do not lift anything that is heavier than your baby or 10 lb (4.5 kg) as told by your health care provider.  Ask your health care provider when you can engage in sexual activity. This may depend on your:  Risk of infection.  Healing rate.  Comfort and desire to engage in sexual activity. Bathing  Do not take baths, swim, or use a hot tub until your health care provider approves.  Ask your health care provider if you can take showers. You may only be allowed to take sponge baths until your incision heals.  Keep your dressing dry as told by your health care provider. General instructions  Do not use tampons or douches until your health care provider approves.  Wear:  Loose, comfortable clothing.  A supportive and well-fitting bra.  Watch for any blood clots that  may pass from your vagina. These may look like clumps of dark red, brown, or black discharge.  Keep your perineum clean and dry as told by your health care provider.  Wipe from front to back when you use the toilet.  If possible, have someone help you care for your baby and help with household activities for a few days after you leave the hospital.  Keep all follow-up visits for you and your baby as told by your health care provider. This is important. Contact a health care provider if:  You have:  Bad-smelling vaginal discharge.  Difficulty urinating.  Pain when urinating.  A sudden increase or decrease in the frequency of your bowel movements.  More redness, swelling, or pain around your incision.  More fluid or blood coming from your incision.  Pus or a bad smell coming from your incision.  A fever.  A rash.  Little or no interest in activities you used to enjoy.  Questions about caring for yourself or your baby.  Nausea.  Your incision feels warm to the touch.  Your breasts turn red or become painful or hard.  You feel unusually sad or worried.  You vomit.  You pass large blood clots from your vagina. If you pass a blood clot, save it to show to your health care provider. Do not flush blood clots down the toilet without showing your health care provider.  You urinate more than usual.  You are dizzy or light-headed.  You have not breastfed and have not had a menstrual period for 12 weeks after delivery.  You stopped breastfeeding and have not had a menstrual period for 12 weeks after stopping breastfeeding. Get help right away if:  You have:  Pain that does not go away or get better with medicine.  Chest pain.  Difficulty breathing.  Blurred vision or spots in your vision.  Thoughts about hurting yourself or your baby.  New pain in your abdomen or in one of your legs.  A severe headache.  You faint.  You bleed from your vagina so much that  you fill two sanitary pads in one hour. This information is not intended to replace advice given to you by your health care provider. Make sure you discuss any questions you have with your health care provider. Document Released: 03/11/2002 Document Revised: 10/28/2015 Document Reviewed: 05/24/2015 Elsevier Interactive Patient Education  2017 [employer] Iron-Rich Diet Introduction Iron is a mineral that helps your body to produce hemoglobin. Hemoglobin is a protein in your red blood cells that carries oxygen to your body's tissues. Eating too little iron may cause you to feel weak and tired, and it can increase your risk for infection. Eating enough iron is necessary for your body's metabolism, muscle function, and nervous system. Iron is naturally found in many foods. It can also be added to foods or fortified in foods. There are two types of dietary iron:  Heme iron. Heme iron is absorbed by the body more easily than nonheme iron. Heme iron is found in meat, poultry, and fish.  Nonheme iron. Nonheme  iron is found in dietary supplements, iron-fortified grains, beans, and vegetables. You may need to follow an iron-rich diet if:  You have been diagnosed with iron deficiency or iron-deficiency anemia.  You have a condition that prevents you from absorbing dietary iron, such as:  Infection in your intestines.  Celiac disease. This involves long-lasting (chronic) inflammation of your intestines.  You do not eat enough iron.  You eat a diet that is high in foods that impair iron absorption.  You have lost a lot of blood.  You have heavy bleeding during your menstrual cycle.  You are pregnant. What is my plan? Your health care provider may help you to determine how much iron you need per day based on your condition. Generally, when a person consumes sufficient amounts of iron in the diet, the following iron needs are met:  Men.  50-58 years old: 11 mg per day.  76-25 years old: 8  mg per day.  Women.  74-34 years old: 15 mg per day.  81-21 years old: 18 mg per day.  Over 19 years old: 8 mg per day.  Pregnant women: 27 mg per day.  Breastfeeding women: 9 mg per day. What do I need to know about an iron-rich diet?  Eat fresh fruits and vegetables that are high in vitamin C along with foods that are high in iron. This will help increase the amount of iron that your body absorbs from food, especially with foods containing nonheme iron. Foods that are high in vitamin C include oranges, peppers, tomatoes, and mango.  Take iron supplements only as directed by your health care provider. Overdose of iron can be life-threatening. If you were prescribed iron supplements, take them with orange juice or a vitamin C supplement.  Cook foods in pots and pans that are made from iron.  Eat nonheme iron-containing foods alongside foods that are high in heme iron. This helps to improve your iron absorption.  Certain foods and drinks contain compounds that impair iron absorption. Avoid eating these foods in the same meal as iron-rich foods or with iron supplements. These include:  Coffee, black tea, and red wine.  Milk, dairy products, and foods that are high in calcium.  Beans, soybeans, and peas.  Whole grains.  When eating foods that contain both nonheme iron and compounds that impair iron absorption, follow these tips to absorb iron better.  Soak beans overnight before cooking.  Soak whole grains overnight and drain them before using.  Ferment flours before baking, such as using yeast in bread dough. What foods can I eat? Grains  Iron-fortified breakfast cereal. Iron-fortified whole-wheat bread. Enriched rice. Sprouted grains. Vegetables  Spinach. Potatoes with skin. Green peas. Broccoli. Red and green bell peppers. Fermented vegetables. Fruits  Prunes. Raisins. Oranges. Strawberries. Mango. Grapefruit. Meats and Other Protein Sources  Beef liver. Oysters. Beef.  Shrimp. [place]. Chicken. Sparks. Sardines. Chickpeas. Nuts. Tofu. Beverages  Tomato juice. Fresh orange juice. Prune juice. Hibiscus tea. Fortified instant breakfast shakes. Condiments  Tahini. Fermented soy sauce. Sweets and Desserts  Black-strap molasses. Other  Wheat germ. The items listed above may not be a complete list of recommended foods or beverages. Contact your dietitian for more options.  What foods are not recommended? Grains  Whole grains. Bran cereal. Bran flour. Oats. Vegetables  Artichokes. Brussels sprouts. Kale. Fruits  Blueberries. Raspberries. Strawberries. Figs. Meats and Other Protein Sources  Soybeans. Products made from soy protein. Dairy  Milk. Cream. Cheese. Yogurt. Cottage cheese. Beverages  Coffee.  Black tea. Red wine. Sweets and Desserts  Cocoa. Chocolate. Ice cream. Other  Basil. Oregano. Parsley. The items listed above may not be a complete list of foods and beverages to avoid. Contact your dietitian for more information.  This information is not intended to replace advice given to you by your health care provider. Make sure you discuss any questions you have with your health care provider. Document Released: 01/31/2005 Document Revised: 01/07/2016 Document Reviewed: 01/14/2014  2017 Elsevier

## 2016-09-27 ENCOUNTER — Encounter: Payer: Self-pay | Admitting: Advanced Practice Midwife

## 2016-09-27 ENCOUNTER — Ambulatory Visit (INDEPENDENT_AMBULATORY_CARE_PROVIDER_SITE_OTHER): Payer: Medicaid Other | Admitting: Advanced Practice Midwife

## 2016-09-27 ENCOUNTER — Encounter: Payer: Self-pay | Admitting: Family Medicine

## 2016-09-27 NOTE — Patient Instructions (Signed)
Breast Self-Awareness Breast self-awareness means being familiar with how your breasts look and feel. It involves checking your breasts regularly and reporting any changes to your health care provider. Practicing breast self-awareness is important. A change in your breasts can be a sign of a serious medical problem. Being familiar with how your breasts look and feel allows you to find any problems early, when treatment is more likely to be successful. All women should practice breast self-awareness, including women who have had breast implants. How to do a breast self-exam One way to learn what is normal for your breasts and whether your breasts are changing is to do a breast self-exam. To do a breast self-exam: Look for Changes   1. Remove all the clothing above your waist. 2. Stand in front of a mirror in a room with good lighting. 3. Put your hands on your hips. 4. Push your hands firmly downward. 5. Compare your breasts in the mirror. Look for differences between them (asymmetry), such as:  Differences in shape.  Differences in size.  Puckers, dips, and bumps in one breast and not the other. 6. Look at each breast for changes in your skin, such as:  Redness.  Scaly areas. 7. Look for changes in your nipples, such as:  Discharge.  Bleeding.  Dimpling.  Redness.  A change in position. Feel for Changes   Carefully feel your breasts for lumps and changes. It is best to do this while lying on your back on the floor and again while sitting or standing in the shower or tub with soapy water on your skin. Feel each breast in the following way:  Place the arm on the side of the breast you are examining above your head.  Feel your breast with the other hand.  Start in the nipple area and make  inch (2 cm) overlapping circles to feel your breast. Use the pads of your three middle fingers to do this. Apply light pressure, then medium pressure, then firm pressure. The light pressure  will allow you to feel the tissue closest to the skin. The medium pressure will allow you to feel the tissue that is a little deeper. The firm pressure will allow you to feel the tissue close to the ribs.  Continue the overlapping circles, moving downward over the breast until you feel your ribs below your breast.  Move one finger-width toward the center of the body. Continue to use the  inch (2 cm) overlapping circles to feel your breast as you move slowly up toward your collarbone.  Continue the up and down exam using all three pressures until you reach your armpit. Write Down What You Find   Write down what is normal for each breast and any changes that you find. Keep a written record with breast changes or normal findings for each breast. By writing this information down, you do not need to depend only on memory for size, tenderness, or location. Write down where you are in your menstrual cycle, if you are still menstruating. If you are having trouble noticing differences in your breasts, do not get discouraged. With time you will become more familiar with the variations in your breasts and more comfortable with the exam. How often should I examine my breasts? Examine your breasts every month. If you are breastfeeding, the best time to examine your breasts is after a feeding or after using a breast pump. If you menstruate, the best time to examine your breasts is 5-7 days   after your period is over. During your period, your breasts are lumpier, and it may be more difficult to notice changes. When should I see my health care provider? See your health care provider if you notice:  A change in shape or size of your breasts or nipples.  A change in the skin of your breast or nipples, such as a reddened or scaly area.  Unusual discharge from your nipples.  A lump or thick area that was not there before.  Pain in your breasts.  Anything that concerns you. This information is not intended to  replace advice given to you by your health care provider. Make sure you discuss any questions you have with your health care provider. Document Released: 06/19/2005 Document Revised: 11/25/2015 Document Reviewed: 05/09/2015 Elsevier Interactive Patient Education  2017 [employer] Inc[employer]  

## 2016-09-27 NOTE — Progress Notes (Signed)
Subjective:     Joann King is a 26 y.o. female who presents for a postpartum visit. She is 5 weeks postpartum following a low cervical transverse Cesarean section. I have fully reviewed the prenatal and intrapartum course. The delivery was at 37 gestational weeks. Outcome: repeat cesarean section, low transverse incision. Anesthesia: spinal. Postpartum course has been Uncomplicated. Baby's course has been uncomplicated. Baby is feeding by bottle - Similac Advance. Bleeding no bleeding. Bowel function is normal. Bladder function is normal. Patient is sexually active. Contraception method is tubal ligation. Postpartum depression screening: . negative The following portions of the patient's history were reviewed and updated as appropriate: allergies, current medications, past family history, past medical history, past social history, past surgical history and problem list.  Pap neg 03/2016  Review of Systems Pertinent items are noted in HPI.   Objective:  BP 113/66   Pulse 72   Ht 5\' 6"  (1.676 m)   Wt 221 lb 4.8 oz (100.4 kg)   Breastfeeding? No   BMI 35.72 kg/m  General:  alert, cooperative, appears stated age and no distress   Breasts:  declined  Lungs: clear to auscultation bilaterally  Heart:  regular rate and rhythm, S1, S2 normal, no murmur, click, rub or gallop  Abdomen: soft, non-tender; bowel sounds normal; no masses,  no organomegaly   Vulva:  not evaluated  Vagina: not evaluated        Assessment:     Nml postpartum exam. Pap smear not done at today's visit.    1. Postpartum care and examination    Plan:    1. Contraception: tubal ligation 2. Follow up in: 1 year or as needed.

## 2016-12-11 ENCOUNTER — Encounter (HOSPITAL_COMMUNITY): Payer: Self-pay | Admitting: Emergency Medicine

## 2016-12-11 DIAGNOSIS — Y999 Unspecified external cause status: Secondary | ICD-10-CM

## 2016-12-11 DIAGNOSIS — S29012A Strain of muscle and tendon of back wall of thorax, initial encounter: Secondary | ICD-10-CM | POA: Diagnosis present

## 2016-12-11 DIAGNOSIS — Y9389 Activity, other specified: Secondary | ICD-10-CM | POA: Insufficient documentation

## 2016-12-11 DIAGNOSIS — M546 Pain in thoracic spine: Secondary | ICD-10-CM | POA: Diagnosis not present

## 2016-12-11 DIAGNOSIS — M25512 Pain in left shoulder: Secondary | ICD-10-CM | POA: Insufficient documentation

## 2016-12-11 DIAGNOSIS — Y92410 Unspecified street and highway as the place of occurrence of the external cause: Secondary | ICD-10-CM | POA: Insufficient documentation

## 2016-12-11 DIAGNOSIS — Z87891 Personal history of nicotine dependence: Secondary | ICD-10-CM | POA: Diagnosis not present

## 2016-12-11 DIAGNOSIS — M25511 Pain in right shoulder: Secondary | ICD-10-CM | POA: Insufficient documentation

## 2016-12-11 DIAGNOSIS — Z5321 Procedure and treatment not carried out due to patient leaving prior to being seen by health care provider: Secondary | ICD-10-CM
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
## 2016-12-12 ENCOUNTER — Emergency Department (HOSPITAL_COMMUNITY)
Admission: EM | Admit: 2016-12-12 | Discharge: 2016-12-12 | Disposition: A | Discharge status: Home / Self Care | Payer: No Typology Code available for payment source | Attending: Emergency Medicine | Admitting: Emergency Medicine

## 2016-12-12 ENCOUNTER — Emergency Department (HOSPITAL_COMMUNITY)
Admission: EM | Admit: 2016-12-12 | Discharge: 2016-12-12 | Disposition: A | Discharge status: Home / Self Care | Payer: No Typology Code available for payment source | Source: Home / Self Care

## 2016-12-12 DIAGNOSIS — Y999 Unspecified external cause status: Secondary | ICD-10-CM | POA: Insufficient documentation

## 2016-12-12 DIAGNOSIS — M546 Pain in thoracic spine: Secondary | ICD-10-CM | POA: Insufficient documentation

## 2016-12-12 DIAGNOSIS — Y9389 Activity, other specified: Secondary | ICD-10-CM | POA: Insufficient documentation

## 2016-12-12 DIAGNOSIS — Z87891 Personal history of nicotine dependence: Secondary | ICD-10-CM | POA: Insufficient documentation

## 2016-12-12 DIAGNOSIS — Y92410 Unspecified street and highway as the place of occurrence of the external cause: Secondary | ICD-10-CM | POA: Insufficient documentation

## 2016-12-12 DIAGNOSIS — Z5321 Procedure and treatment not carried out due to patient leaving prior to being seen by health care provider: Secondary | ICD-10-CM | POA: Insufficient documentation

## 2016-12-12 MED ORDER — IBUPROFEN 400 MG PO TABS
400.0000 mg | ORAL_TABLET | Freq: Once | ORAL | Status: AC | PRN
  Administered 2016-12-12: 400 mg via ORAL

## 2016-12-12 MED ORDER — IBUPROFEN 400 MG PO TABS
ORAL_TABLET | ORAL | Status: AC
  Filled 2016-12-12: qty 1
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
## 2016-12-12 NOTE — ED Notes (Signed)
Pt called with no response 

## 2016-12-12 NOTE — ED Notes (Signed)
Patient called x 2 with no response. 

## 2016-12-12 NOTE — ED Notes (Signed)
Pt called to reassess vitals, had no answer.

## 2017-07-28 ENCOUNTER — Other Ambulatory Visit: Payer: Self-pay

## 2017-07-28 ENCOUNTER — Encounter (HOSPITAL_COMMUNITY): Payer: Self-pay | Admitting: Emergency Medicine

## 2017-07-28 ENCOUNTER — Emergency Department (HOSPITAL_COMMUNITY)
Admission: EM | Admit: 2017-07-28 | Discharge: 2017-07-28 | Disposition: A | Discharge status: Home / Self Care | Payer: Medicaid Other | Attending: Emergency Medicine | Admitting: Emergency Medicine

## 2017-07-28 DIAGNOSIS — Z87891 Personal history of nicotine dependence: Secondary | ICD-10-CM | POA: Insufficient documentation

## 2017-07-28 DIAGNOSIS — R42 Dizziness and giddiness: Secondary | ICD-10-CM | POA: Insufficient documentation

## 2017-07-28 DIAGNOSIS — R55 Syncope and collapse: Secondary | ICD-10-CM

## 2017-07-28 LAB — URINALYSIS, ROUTINE W REFLEX MICROSCOPIC
Bilirubin Urine: NEGATIVE
GLUCOSE, UA: NEGATIVE mg/dL
Hgb urine dipstick: NEGATIVE
Ketones, ur: NEGATIVE mg/dL
LEUKOCYTES UA: NEGATIVE
Nitrite: NEGATIVE
PH: 6 (ref 5.0–8.0)
Protein, ur: NEGATIVE mg/dL
Specific Gravity, Urine: 1.004 — ABNORMAL LOW (ref 1.005–1.030)

## 2017-07-28 LAB — BASIC METABOLIC PANEL
Anion gap: 9 (ref 5–15)
BUN: 10 mg/dL (ref 6–20)
CHLORIDE: 105 mmol/L (ref 101–111)
CO2: 22 mmol/L (ref 22–32)
Calcium: 8.9 mg/dL (ref 8.9–10.3)
Creatinine, Ser: 0.78 mg/dL (ref 0.44–1.00)
GFR calc Af Amer: >60 mL/min (ref >60)
GFR calc non Af Amer: >60 mL/min (ref >60)
Glucose, Bld: 91 mg/dL (ref 65–99)
POTASSIUM: 3.5 mmol/L (ref 3.5–5.1)
SODIUM: 136 mmol/L (ref 135–145)

## 2017-07-28 LAB — CBC
HEMATOCRIT: 33.9 % — AB (ref 36.0–46.0)
Hemoglobin: 11.3 g/dL — ABNORMAL LOW (ref 12.0–15.0)
MCH: 30.2 pg (ref 26.0–34.0)
MCHC: 33.3 g/dL (ref 30.0–36.0)
MCV: 90.6 fL (ref 78.0–100.0)
Platelets: 370 10*3/uL (ref 150–400)
RBC: 3.74 MIL/uL — AB (ref 3.87–5.11)
RDW: 14.8 % (ref 11.5–15.5)
WBC: 5.1 10*3/uL (ref 4.0–10.5)

## 2017-07-28 LAB — I-STAT BETA HCG BLOOD, ED (MC, WL, AP ONLY): I-stat hCG, quantitative: <5 m[IU]/mL (ref <5)

## 2017-07-28 MED ORDER — SODIUM CHLORIDE 0.9 % IV BOLUS (SEPSIS)
1000.0000 mL | Freq: Once | INTRAVENOUS | Status: AC
  Administered 2017-07-28: 1000 mL via INTRAVENOUS

## 2017-07-28 NOTE — ED Provider Notes (Signed)
[facility] EMERGENCY DEPARTMENT Provider Note   CSN: [id number] Arrival date & time: 07/28/17  4098     History   Chief Complaint Chief Complaint  Patient presents with  . Dizziness    HPI   Blood pressure 130/89, pulse 74, temperature 98.3 F (36.8 C), resp. rate 17, height 5\' 6"  (1.676 m), weight 97.5 kg (215 lb), last menstrual period 07/07/2017, SpO2 98 %, not currently breastfeeding.  Joann King is a 27 y.o. female complaining of lightheaded sensation intermittently over the last several weeks while standing at work, she works at [employer] and stands frequently, she does get regular breaks.  She does have a history of anemia, no transfusions and she does not take any iron supplements.  She denies any vertigo, palpitations, cough, syncope, shortness of breath, history of DVT/PE, recent immobilizations, calf pain, leg swelling, exogenous estrogen, family history of early cardiac death.  Last menstrual period was 2 weeks ago, states that she cannot be pregnant.  No abdominal pain.  She been eating and drinking normally, she tries to push fluids, no nausea vomiting or diarrhea.  Past Medical History:  Diagnosis Date  . Anemia    during first pregnancy  . Ectopic pregnancy, tubal   . Heartburn in pregnancy   . Herpes genitalia    doesn't think ever had outbreak  . Pregnancy induced hypertension     Patient Active Problem List   Diagnosis Date Noted  . Anemia 08/15/2016  . Gestational hypertension 07/20/2016  . Family history of mother as victim of domestic violence 07/20/2016  . Substance abuse (HCC) 07/20/2016  . History of cesarean delivery 10/07/2013  . Alcohol use complicating pregnancy in third trimester 10/07/2013    Past Surgical History:  Procedure Laterality Date  . CESAREAN SECTION  2009  . CESAREAN SECTION N/A 10/07/2013   Procedure: CESAREAN SECTION;  Surgeon: Lesly Dukes, MD;  Location: [facility];  Service: Obstetrics;  Laterality: N/A;   . CESAREAN SECTION WITH BILATERAL TUBAL LIGATION N/A 08/19/2016   Procedure: CESAREAN SECTION;  Surgeon: Catalina Antigua, MD;  Location: [facility];  Service: Obstetrics;  Laterality: N/A;  . DIAGNOSTIC LAPAROSCOPY WITH REMOVAL OF ECTOPIC PREGNANCY Left 08/02/2015   Procedure: DIAGNOSTIC LAPAROSCOPY WITH REMOVAL OF ECTOPIC PREGNANCY;  Surgeon: Catalina Antigua, MD;  Location: [facility];  Service: Gynecology;  Laterality: Left;  for ectopic   . TUBAL LIGATION Bilateral 08/19/2016   Procedure: BILATERAL TUBAL LIGATION;  Surgeon: Catalina Antigua, MD;  Location: [facility];  Service: Obstetrics;  Laterality: Bilateral;    OB History    Gravida Para Term Preterm AB Living   5 3 3   2 3    SAB TAB Ectopic Multiple Live Births     1 1 0 3       Home Medications    Prior to Admission medications   Medication Sig Start Date End Date Taking? Authorizing Provider  oxyCODONE (OXY IR/ROXICODONE) 5 MG immediate release tablet Take 1 tablet (5 mg total) by mouth every 4 (four) hours as needed (pain scale 4-7). 08/22/16   Allie Bossier, MD    Family History Family History  Problem Relation Age of Onset  . Diabetes Maternal Grandmother   . Hypertension Maternal Grandmother     Social History Social History   Tobacco Use  . Smoking status: Former Smoker    Packs/day: 0.50    Types: Cigarettes  . Smokeless tobacco: Never Used  Substance Use Topics  . Alcohol  use: No  . Drug use: No     Allergies   Patient has no known allergies.   Review of Systems Review of Systems  A complete review of systems was obtained and all systems are negative except as noted in the HPI and PMH.   Physical Exam Updated Vital Signs BP 137/81   Pulse 65   Temp 98.3 F (36.8 C)   Resp 17   Ht 5\' 6"  (1.676 m)   Wt 97.5 kg (215 lb)   LMP 07/07/2017   SpO2 100%   BMI 34.70 kg/m   Physical Exam  Constitutional: She is oriented to person, place, and time. She appears well-developed and  well-nourished. No distress.  HENT:  Head: Normocephalic and atraumatic.  Mouth/Throat: Oropharynx is clear and moist.  No significant conjunctival pallor  Eyes: Conjunctivae and EOM are normal. Pupils are equal, round, and reactive to light.  Neck: Normal range of motion.  Cardiovascular: Normal rate, regular rhythm and intact distal pulses.  Pulmonary/Chest: Effort normal and breath sounds normal.  Abdominal: Soft. There is no tenderness.  Musculoskeletal: Normal range of motion.  Neurological: She is alert and oriented to person, place, and time.  Skin: She is not diaphoretic.  Psychiatric: She has a normal mood and affect.  Nursing note and vitals reviewed.    ED Treatments / Results  Labs (all labs ordered are listed, but only abnormal results are displayed) Labs Reviewed  CBC - Abnormal; Notable for the following components:      Result Value   RBC 3.74 (*)    Hemoglobin 11.3 (*)    HCT 33.9 (*)    All other components within normal limits  URINALYSIS, ROUTINE W REFLEX MICROSCOPIC - Abnormal; Notable for the following components:   Color, Urine STRAW (*)    APPearance HAZY (*)    Specific Gravity, Urine 1.004 (*)    All other components within normal limits  BASIC METABOLIC PANEL  I-STAT BETA HCG BLOOD, ED ([facility], [facility], [facility] ONLY)    EKG  EKG Interpretation  Date/Time:  Saturday July 28 2017 07:52:21 EST Ventricular Rate:  78 PR Interval:  162 QRS Duration: 90 QT Interval:  364 QTC Calculation: 414 R Axis:   89 Text Interpretation:  Normal sinus rhythm Normal ECG Confirmed by Benjiman Core [phone]) on 07/28/2017 10:15:42 AM       Radiology No results found.  Procedures Procedures (including critical care time)  Medications Ordered in ED Medications  sodium chloride 0.9 % bolus 1,000 mL (0 mLs Intravenous Stopped 07/28/17 1038)     Initial Impression / Assessment and Plan / ED Course  I have reviewed the triage vital signs and the nursing  notes.  Pertinent labs & imaging results that were available during my care of the patient were reviewed by me and considered in my medical decision making (see chart for details).     Vitals:   07/28/17 0900 07/28/17 0930 07/28/17 1000 07/28/17 1030  BP: 135/79 128/77 127/83 137/81  Pulse: 68 68 70 65  Resp: (!) 22 15 12 17   Temp:      SpO2: 100% 100% 100% 100%  Weight:      Height:        Medications  sodium chloride 0.9 % bolus 1,000 mL (0 mLs Intravenous Stopped 07/28/17 1038)    Joann King is 27 y.o. female presenting with lightheaded sensation standing, patient states that the history of anemia however her hemoglobin today is 11.7 with normal  MCV.  EKG with no arrhythmia, patient is not pregnant, no electrolyte abnormality, no significant abnormality on orthostatic vital signs, patient is given fluid bolus and will write her a note to recommend frequent breaks at work while standing.  Evaluation does not show pathology that would require ongoing emergent intervention or inpatient treatment. Pt is hemodynamically stable and mentating appropriately. Discussed findings and plan with patient/guardian, who agrees with care plan. All questions answered. Return precautions discussed and outpatient follow up given.      Final Clinical Impressions(s) / ED Diagnoses   Final diagnoses:  Near syncope    ED Discharge Orders    None       Lynetta Mareisciotta, Mardella Laymanicole, PA-C 07/28/17 1150    Benjiman CorePickering, Nathan, MD 07/28/17 1531

## 2017-07-28 NOTE — Discharge Instructions (Signed)
Do not hesitate to return to the Emergency Department for any new, worsening or concerning symptoms.  ° °If you do not have a primary care doctor you can establish one at the  ° °[facility]: °[address] °[place] [address] °[phone] ° °After you establish care. Let them know you were seen in the emergency room. They must obtain records for further management.  ° ° °

## 2017-07-28 NOTE — ED Triage Notes (Signed)
Pt. Stated, Joann Atlasve been getting dizzy for a year off and on and it seems like its getting worse.

## 2017-09-25 ENCOUNTER — Other Ambulatory Visit: Payer: Self-pay

## 2017-09-25 ENCOUNTER — Encounter (HOSPITAL_COMMUNITY): Payer: Self-pay | Admitting: *Deleted

## 2017-09-25 ENCOUNTER — Emergency Department (HOSPITAL_COMMUNITY)
Admission: EM | Admit: 2017-09-25 | Discharge: 2017-09-25 | Disposition: A | Discharge status: Home / Self Care | Payer: Medicaid Other | Attending: Emergency Medicine | Admitting: Emergency Medicine

## 2017-09-25 DIAGNOSIS — Z87891 Personal history of nicotine dependence: Secondary | ICD-10-CM | POA: Insufficient documentation

## 2017-09-25 DIAGNOSIS — R42 Dizziness and giddiness: Secondary | ICD-10-CM | POA: Diagnosis present

## 2017-09-25 DIAGNOSIS — Z79899 Other long term (current) drug therapy: Secondary | ICD-10-CM | POA: Diagnosis not present

## 2017-09-25 LAB — CBC
HEMATOCRIT: 36.9 % (ref 36.0–46.0)
HEMOGLOBIN: 11.9 g/dL — AB (ref 12.0–15.0)
MCH: 29.2 pg (ref 26.0–34.0)
MCHC: 32.2 g/dL (ref 30.0–36.0)
MCV: 90.7 fL (ref 78.0–100.0)
Platelets: 377 10*3/uL (ref 150–400)
RBC: 4.07 MIL/uL (ref 3.87–5.11)
RDW: 14 % (ref 11.5–15.5)
WBC: 5.5 10*3/uL (ref 4.0–10.5)

## 2017-09-25 LAB — BASIC METABOLIC PANEL
Anion gap: 9 (ref 5–15)
BUN: 9 mg/dL (ref 6–20)
CO2: 21 mmol/L — AB (ref 22–32)
Calcium: 9 mg/dL (ref 8.9–10.3)
Chloride: 107 mmol/L (ref 101–111)
Creatinine, Ser: 0.88 mg/dL (ref 0.44–1.00)
GFR calc non Af Amer: >60 mL/min (ref >60)
Glucose, Bld: 82 mg/dL (ref 65–99)
POTASSIUM: 4 mmol/L (ref 3.5–5.1)
SODIUM: 137 mmol/L (ref 135–145)

## 2017-09-25 LAB — URINALYSIS, ROUTINE W REFLEX MICROSCOPIC
BACTERIA UA: NONE SEEN
BILIRUBIN URINE: NEGATIVE
Glucose, UA: NEGATIVE mg/dL
Hgb urine dipstick: NEGATIVE
KETONES UR: NEGATIVE mg/dL
Nitrite: NEGATIVE
PH: 6 (ref 5.0–8.0)
PROTEIN: NEGATIVE mg/dL
Specific Gravity, Urine: 1.014 (ref 1.005–1.030)

## 2017-09-25 LAB — CBG MONITORING, ED: Glucose-Capillary: 74 mg/dL (ref 65–99)

## 2017-09-25 LAB — I-STAT BETA HCG BLOOD, ED (MC, WL, AP ONLY): I-stat hCG, quantitative: <5 m[IU]/mL (ref <5)

## 2017-09-25 NOTE — Discharge Instructions (Signed)
Please read attached information. If you experience any new or worsening signs or symptoms please return to the emergency room for evaluation. Please follow-up with your primary care provider or specialist as discussed. Please use medication prescribed only as directed and discontinue taking if you have any concerning signs or symptoms.   °

## 2017-09-25 NOTE — ED Notes (Signed)
Pt ambulatory w/ stead gait.

## 2017-09-25 NOTE — ED Provider Notes (Addendum)
[facility] EMERGENCY DEPARTMENT Provider Note   CSN: [id number] Arrival date & time: 09/25/17  1914     History   Chief Complaint Chief Complaint  Patient presents with  . Dizziness    HPI Joann King is a 27 y.o. female.  HPI   27 year old female presents today with complaints of dizziness.  Patient notes that after giving birth to her child approximately 1 year ago she has had intermittent episodes of dizziness.  She notes this comes on slowly with no associated neurological deficits.  She denies any room spinning sensation reports she feels like she may pass out.  She notes that while standing at work symptoms are worse.  She denies any blood loss, denies any infectious etiology, chest pain or shortness of breath.  Patient has been seen for this in the past with no acute findings.  Patient has not followed up as an outpatient for this.  Past Medical History:  Diagnosis Date  . Anemia    during first pregnancy  . Ectopic pregnancy, tubal   . Heartburn in pregnancy   . Herpes genitalia    doesn't think ever had outbreak  . Pregnancy induced hypertension     Patient Active Problem List   Diagnosis Date Noted  . Anemia 08/15/2016  . Gestational hypertension 07/20/2016  . Family history of mother as victim of domestic violence 07/20/2016  . Substance abuse (HCC) 07/20/2016  . History of cesarean delivery 10/07/2013  . Alcohol use complicating pregnancy in third trimester 10/07/2013    Past Surgical History:  Procedure Laterality Date  . CESAREAN SECTION  2009  . CESAREAN SECTION N/A 10/07/2013   Procedure: CESAREAN SECTION;  Surgeon: Lesly Dukes, MD;  Location: [facility];  Service: Obstetrics;  Laterality: N/A;  . CESAREAN SECTION WITH BILATERAL TUBAL LIGATION N/A 08/19/2016   Procedure: CESAREAN SECTION;  Surgeon: Catalina Antigua, MD;  [facility];  Service: Obstetrics;  Laterality: N/A;  . DIAGNOSTIC LAPAROSCOPY WITH REMOVAL OF  ECTOPIC PREGNANCY Left 08/02/2015   Procedure: DIAGNOSTIC LAPAROSCOPY WITH REMOVAL OF ECTOPIC PREGNANCY;  Surgeon: Catalina Antigua, MD;  Location: [facility];  Service: Gynecology;  Laterality: Left;  for ectopic   . TUBAL LIGATION Bilateral 08/19/2016   Procedure: BILATERAL TUBAL LIGATION;  Surgeon: Catalina Antigua, MD;  Location: [facility];  Service: Obstetrics;  Laterality: Bilateral;     OB History    Gravida  5   Para  3   Term  3   Preterm      AB  2   Living  3     SAB      TAB  1   Ectopic  1   Multiple  0   Live Births  3            Home Medications    Prior to Admission medications   Medication Sig Start Date End Date Taking? Authorizing Provider  oxyCODONE (OXY IR/ROXICODONE) 5 MG immediate release tablet Take 1 tablet (5 mg total) by mouth every 4 (four) hours as needed (pain scale 4-7). 08/22/16   Allie Bossier, MD    Family History Family History  Problem Relation Age of Onset  . Diabetes Maternal Grandmother   . Hypertension Maternal Grandmother     Social History Social History   Tobacco Use  . Smoking status: Former Smoker    Packs/day: 0.50    Types: Cigarettes  . Smokeless tobacco: Never Used  Substance Use Topics  .  Alcohol use: No  . Drug use: No     Allergies   Patient has no known allergies.   Review of Systems Review of Systems  All other systems reviewed and are negative.    Physical Exam Updated Vital Signs BP 140/81 (BP Location: Right Arm)   Pulse 79   Temp 98.2 F (36.8 C) (Oral)   Resp 16   Ht 5\' 6"  (1.676 m)   Wt 102.1 kg (225 lb)   LMP 09/06/2017 (Approximate)   SpO2 95%   BMI 36.32 kg/m   Physical Exam  Constitutional: She is oriented to person, place, and time. She appears well-developed and well-nourished.  HENT:  Head: Normocephalic and atraumatic.  Eyes: Pupils are equal, round, and reactive to light. Conjunctivae are normal. Right eye exhibits no discharge. Left eye exhibits no discharge.  No scleral icterus.  Neck: Normal range of motion. No JVD present. No tracheal deviation present.  Cardiovascular: Normal rate, regular rhythm, normal heart sounds and intact distal pulses. Exam reveals no gallop and no friction rub.  No murmur heard. Pulmonary/Chest: Effort normal and breath sounds normal. No stridor. No respiratory distress. She has no wheezes. She has no rales. She exhibits no tenderness.  Neurological: She is alert and oriented to person, place, and time. She displays normal reflexes. No cranial nerve deficit or sensory deficit. She exhibits normal muscle tone. Coordination normal.  Psychiatric: She has a normal mood and affect. Her behavior is normal. Judgment and thought content normal.  Nursing note and vitals reviewed.    ED Treatments / Results  Labs (all labs ordered are listed, but only abnormal results are displayed) Labs Reviewed  BASIC METABOLIC PANEL - Abnormal; Notable for the following components:      Result Value   CO2 21 (*)    All other components within normal limits  CBC - Abnormal; Notable for the following components:   Hemoglobin 11.9 (*)    All other components within normal limits  URINALYSIS, ROUTINE W REFLEX MICROSCOPIC - Abnormal; Notable for the following components:   Leukocytes, UA SMALL (*)    Squamous Epithelial / LPF 0-5 (*)    All other components within normal limits  CBG MONITORING, ED  I-STAT BETA HCG BLOOD, ED ([facility], [facility], [facility] ONLY)    EKG None   ED ECG REPORT   Date: 11/20/2017  Rate: 84  Rhythm: normal sinus rhythm  QRS Axis: normal  Intervals: normal  ST/T Wave abnormalities: normal  Conduction Disutrbances:none  Narrative Interpretation:   Old EKG Reviewed: unchanged  I have personally reviewed the EKG tracing and agree with the computerized printout as noted.  Radiology No results found.  Procedures Procedures (including critical care time)  Medications Ordered in ED Medications - No data to  display   Initial Impression / Assessment and Plan / ED Course  I have reviewed the triage vital signs and the nursing notes.  Pertinent labs & imaging results that were available during my care of the patient were reviewed by me and considered in my medical decision making (see chart for details).  Labs: UA, I stat beta HCG, BMP, CBC   Imaging: ED EKG-no acute abnormalities  Consults:  Therapeutics:  Discharge Meds:   Assessment/Plan:    27 year old female presents today with complaints of dizziness.  She is well-appearing in no acute distress.  This has been ongoing for 1 year without worsening of symptoms this is episodic with no associated neurological deficits.  I have very low  suspicion for central cause of patient's dizziness no need for imaging at this time.  I have low suspicion for PE, cardiac etiology, blood loss or hypovolemia.  I had a lengthy discussion with the patient encouraged her to follow-up with the primary care provider for ongoing evaluation and management.  She is given strict return precautions, she verbalized understanding and agreement to today's plan.  Patient's laboratory analysis without acute abnormalities.   Final Clinical Impressions(s) / ED Diagnoses   Final diagnoses:  Dizziness    ED Discharge Orders    None       Rosalio Loud 09/25/17 1441    Benjiman Core, MD 09/25/17 2210    Eyvonne Mechanic, PA-C 11/20/17 1347    Benjiman Core, MD 11/20/17 [phone]

## 2017-09-25 NOTE — ED Triage Notes (Signed)
Pt was here in January for dizziness and states it comes and go and she gets dizzy.  Pt denies pain, but feels hot and gets sweaty and dizziness.  Pt states the dizziness will start all the sudden. Nausea 2 days ago.  Reports headaches occasionally. Pt has had tubal.  She does have some times where she felt like her heart was racing.

## 2017-12-19 ENCOUNTER — Emergency Department (HOSPITAL_COMMUNITY)
Admission: EM | Admit: 2017-12-19 | Discharge: 2017-12-19 | Disposition: A | Discharge status: Home / Self Care | Payer: Medicaid Other | Attending: Emergency Medicine | Admitting: Emergency Medicine

## 2017-12-19 ENCOUNTER — Encounter (HOSPITAL_COMMUNITY): Payer: Self-pay | Admitting: Emergency Medicine

## 2017-12-19 DIAGNOSIS — Z87891 Personal history of nicotine dependence: Secondary | ICD-10-CM | POA: Diagnosis not present

## 2017-12-19 DIAGNOSIS — E860 Dehydration: Secondary | ICD-10-CM | POA: Diagnosis not present

## 2017-12-19 DIAGNOSIS — R112 Nausea with vomiting, unspecified: Secondary | ICD-10-CM

## 2017-12-19 LAB — CBC
HEMATOCRIT: 39.9 % (ref 36.0–46.0)
Hemoglobin: 12.6 g/dL (ref 12.0–15.0)
MCH: 29.8 pg (ref 26.0–34.0)
MCHC: 31.6 g/dL (ref 30.0–36.0)
MCV: 94.3 fL (ref 78.0–100.0)
Platelets: 316 10*3/uL (ref 150–400)
RBC: 4.23 MIL/uL (ref 3.87–5.11)
RDW: 13.4 % (ref 11.5–15.5)
WBC: 6.7 10*3/uL (ref 4.0–10.5)

## 2017-12-19 LAB — URINALYSIS, ROUTINE W REFLEX MICROSCOPIC
BILIRUBIN URINE: NEGATIVE
Glucose, UA: NEGATIVE mg/dL
HGB URINE DIPSTICK: NEGATIVE
Ketones, ur: NEGATIVE mg/dL
NITRITE: NEGATIVE
Protein, ur: NEGATIVE mg/dL
SPECIFIC GRAVITY, URINE: 1.014 (ref 1.005–1.030)
pH: 5 (ref 5.0–8.0)

## 2017-12-19 LAB — COMPREHENSIVE METABOLIC PANEL
ALT: 22 U/L (ref 14–54)
AST: 25 U/L (ref 15–41)
Albumin: 3.5 g/dL (ref 3.5–5.0)
Alkaline Phosphatase: 74 U/L (ref 38–126)
Anion gap: 4 — ABNORMAL LOW (ref 5–15)
BILIRUBIN TOTAL: 0.8 mg/dL (ref 0.3–1.2)
BUN: 16 mg/dL (ref 6–20)
CO2: 24 mmol/L (ref 22–32)
Calcium: 8.9 mg/dL (ref 8.9–10.3)
Chloride: 107 mmol/L (ref 101–111)
Creatinine, Ser: 0.87 mg/dL (ref 0.44–1.00)
GFR calc non Af Amer: >60 mL/min (ref >60)
Glucose, Bld: 91 mg/dL (ref 65–99)
POTASSIUM: 4.2 mmol/L (ref 3.5–5.1)
Sodium: 135 mmol/L (ref 135–145)
TOTAL PROTEIN: 6.8 g/dL (ref 6.5–8.1)

## 2017-12-19 LAB — LIPASE, BLOOD: LIPASE: 32 U/L (ref 11–51)

## 2017-12-19 LAB — I-STAT BETA HCG BLOOD, ED (MC, WL, AP ONLY)

## 2017-12-19 MED ORDER — LACTATED RINGERS IV BOLUS
1000.0000 mL | Freq: Once | INTRAVENOUS | Status: AC
  Administered 2017-12-19: 1000 mL via INTRAVENOUS

## 2017-12-19 MED ORDER — ONDANSETRON HCL 4 MG/2ML IJ SOLN
4.0000 mg | Freq: Once | INTRAMUSCULAR | Status: AC
  Administered 2017-12-19: 4 mg via INTRAVENOUS
  Filled 2017-12-19: qty 2

## 2017-12-19 MED ORDER — PENICILLIN V POTASSIUM 250 MG PO TABS
500.0000 mg | ORAL_TABLET | Freq: Once | ORAL | Status: AC
  Administered 2017-12-19: 500 mg via ORAL
  Filled 2017-12-19: qty 2

## 2017-12-19 MED ORDER — ONDANSETRON HCL 4 MG PO TABS: 4.0000 mg | ORAL_TABLET | Freq: Three times a day (TID) | ORAL | 0 refills | Status: DC | PRN

## 2017-12-19 MED ORDER — PENICILLIN V POTASSIUM 500 MG PO TABS: 500.0000 mg | ORAL_TABLET | Freq: Three times a day (TID) | ORAL | 0 refills | Status: DC

## 2017-12-19 NOTE — ED Notes (Signed)
Pt ambulatory to exam room with coffee in hand

## 2017-12-19 NOTE — ED Triage Notes (Signed)
Pt reports vomiting since yesterday, states she feels weak and like she is going to pass out. Pt ambulatory to triage, resp e/u, nad.

## 2017-12-19 NOTE — ED Notes (Signed)
Patient ambulatory to bathroom with steady gait at this time 

## 2017-12-19 NOTE — ED Notes (Signed)
Pt states she is feeling better and denies pain. She is requesting something to eat. Per MD Messner pt okay to eat. Pt given sandwich and water.

## 2017-12-19 NOTE — ED Notes (Signed)
Patient aware of need for urine specimen and will let this RN know when she feels she is able to provide one.

## 2017-12-19 NOTE — ED Provider Notes (Signed)
Emergency Department Provider Note   I have reviewed the triage vital signs and the nursing notes.   HISTORY  Chief Complaint Emesis   HPI Joann King is a 27 y.o. female who had diarrhea two days ago, nausea yesterday and is now here with headache, left jaw pain, weakness and feeling dehydrated. Denies fevers, cough, sob, urinary symptoms, sinus pain or ear pain.   No other associated or modifying symptoms.    Past Medical History:  Diagnosis Date  . Anemia    during first pregnancy  . Ectopic pregnancy, tubal   . Heartburn in pregnancy   . Herpes genitalia    doesn't think ever had outbreak  . Pregnancy induced hypertension     Patient Active Problem List   Diagnosis Date Noted  . Anemia 08/15/2016  . Gestational hypertension 07/20/2016  . Family history of mother as victim of domestic violence 07/20/2016  . Substance abuse (HCC) 07/20/2016  . History of cesarean delivery 10/07/2013  . Alcohol use complicating pregnancy in third trimester 10/07/2013    Past Surgical History:  Procedure Laterality Date  . CESAREAN SECTION  2009  . CESAREAN SECTION N/A 10/07/2013   Procedure: CESAREAN SECTION;  Surgeon: Lesly Dukes, MD;  Location: [facility];  Service: Obstetrics;  Laterality: N/A;  . CESAREAN SECTION WITH BILATERAL TUBAL LIGATION N/A 08/19/2016   Procedure: CESAREAN SECTION;  Surgeon: Catalina Antigua, MD;  Location: [facility];  Service: Obstetrics;  Laterality: N/A;  . DIAGNOSTIC LAPAROSCOPY WITH REMOVAL OF ECTOPIC PREGNANCY Left 08/02/2015   Procedure: DIAGNOSTIC LAPAROSCOPY WITH REMOVAL OF ECTOPIC PREGNANCY;  Surgeon: Catalina Antigua, MD;  Location: [facility];  Service: Gynecology;  Laterality: Left;  for ectopic   . TUBAL LIGATION Bilateral 08/19/2016   Procedure: BILATERAL TUBAL LIGATION;  Surgeon: Catalina Antigua, MD;  Location: [facility];  Service: Obstetrics;  Laterality: Bilateral;    Current Outpatient Rx  . Order #: [id number] Class:  Historical Med  . Order #: [id number] Class: Print  . Order #: [id number] Class: Print    Allergies Patient has no known allergies.  Family History  Problem Relation Age of Onset  . Diabetes Maternal Grandmother   . Hypertension Maternal Grandmother     Social History Social History   Tobacco Use  . Smoking status: Former Smoker    Packs/day: 0.50    Types: Cigarettes  . Smokeless tobacco: Never Used  Substance Use Topics  . Alcohol use: No  . Drug use: No    Review of Systems  All other systems negative except as documented in the HPI. All pertinent positives and negatives as reviewed in the HPI. ____________________________________________   PHYSICAL EXAM:  VITAL SIGNS: ED Triage Vitals  Enc Vitals Group     BP 12/19/17 0732 (!) 144/86     Pulse Rate 12/19/17 0732 83     Resp 12/19/17 0732 16     Temp 12/19/17 0732 98.6 F (37 C)     Temp Source 12/19/17 0732 Oral     SpO2 12/19/17 0732 98 %    Constitutional: Alert and oriented. Well appearing and in no acute distress. Eyes: Conjunctivae are normal. PERRL. EOMI. Head: Atraumatic. Nose: No congestion/rhinnorhea. Mouth/Throat: Mucous membranes are dry.  Oropharynx non-erythematous. Erythema and edema around molars on bottom left. No fluctuance or e/o abscess.  Neck: No stridor.  No meningeal signs.   Cardiovascular: Normal rate, regular rhythm. Good peripheral circulation. Grossly normal heart sounds.   Respiratory: Normal respiratory effort.  No retractions. Lungs  CTAB. Gastrointestinal: Soft and nontender. No distention.  Musculoskeletal: No lower extremity tenderness nor edema. No gross deformities of extremities. Neurologic:  Normal speech and language. No gross focal neurologic deficits are appreciated.  Skin:  Skin is warm, dry and intact. No rash noted.  ____________________________________________   LABS (all labs ordered are listed, but only abnormal results are displayed)  Labs Reviewed    COMPREHENSIVE METABOLIC PANEL - Abnormal; Notable for the following components:      Result Value   Anion gap 4 (*)    All other components within normal limits  URINALYSIS, ROUTINE W REFLEX MICROSCOPIC - Abnormal; Notable for the following components:   APPearance HAZY (*)    Leukocytes, UA TRACE (*)    Bacteria, UA RARE (*)    All other components within normal limits  LIPASE, BLOOD  CBC  I-STAT BETA HCG BLOOD, ED ([facility], [facility], [facility] ONLY)   _____________________________________________   INITIAL IMPRESSION / ASSESSMENT AND PLAN / ED COURSE  Will eval for e/o organ damage, suspect mild dehydration from viral illness in combination with dental infection.   Improved symptoms. Tolerating PO, discharged.    Pertinent labs & imaging results that were available during my care of the patient were reviewed by me and considered in my medical decision making (see chart for details).  ____________________________________________  FINAL CLINICAL IMPRESSION(S) / ED DIAGNOSES  Final diagnoses:  Nausea and vomiting, intractability of vomiting not specified, unspecified vomiting type  Dehydration     MEDICATIONS GIVEN DURING THIS VISIT:  Medications  lactated ringers bolus 1,000 mL ( Intravenous Stopped 12/19/17 0950)  ondansetron (ZOFRAN) injection 4 mg (4 mg Intravenous Given 12/19/17 0826)  penicillin v potassium (VEETID) tablet 500 mg (500 mg Oral Given 12/19/17 0823)     NEW OUTPATIENT MEDICATIONS STARTED DURING THIS VISIT:  New Prescriptions   ONDANSETRON (ZOFRAN) 4 MG TABLET    Take 1 tablet (4 mg total) by mouth every 8 (eight) hours as needed for nausea or vomiting.   PENICILLIN V POTASSIUM (VEETID) 500 MG TABLET    Take 1 tablet (500 mg total) by mouth 3 (three) times daily.    Note:  This note was prepared with assistance of Dragon voice recognition software. Occasional wrong-word or sound-a-like substitutions may have occurred due to the inherent limitations of voice  recognition software.   Marily MemosMesner, Elyssia Strausser, MD 12/19/17 1048

## 2018-02-06 ENCOUNTER — Ambulatory Visit (HOSPITAL_COMMUNITY)
Admission: EM | Admit: 2018-02-06 | Discharge: 2018-02-06 | Disposition: A | Discharge status: Home / Self Care | Payer: Medicaid Other | Attending: Family Medicine | Admitting: Family Medicine

## 2018-02-06 ENCOUNTER — Encounter (HOSPITAL_COMMUNITY): Payer: Self-pay

## 2018-02-06 ENCOUNTER — Other Ambulatory Visit: Payer: Self-pay

## 2018-02-06 DIAGNOSIS — H9311 Tinnitus, right ear: Secondary | ICD-10-CM | POA: Diagnosis not present

## 2018-02-06 MED ORDER — FLUTICASONE PROPIONATE 50 MCG/ACT NA SUSP: 2.0000 | Freq: Every day | NASAL | 12 refills | Status: DC

## 2018-02-06 NOTE — ED Provider Notes (Signed)
[facility]   [id number] 02/06/18 Arrival Time: 1648   SUBJECTIVE:  Joann King is a 27 y.o. female who presents to the urgent care with complaint of three weeks of left sided tinnitus without hearing loss, balance problems or ear pain  No sinus congestion  Tried OTC "tinnitus ear drops" which did not help     Past Medical History:  Diagnosis Date  . Anemia    during first pregnancy  . Ectopic pregnancy, tubal   . Heartburn in pregnancy   . Herpes genitalia    doesn't think ever had outbreak  . Pregnancy induced hypertension    Family History  Problem Relation Age of Onset  . Diabetes Maternal Grandmother   . Hypertension Maternal Grandmother    Social History   Socioeconomic History  . Marital status: Single    Spouse name: Not on file  . Number of children: Not on file  . Years of education: Not on file  . Highest education level: Not on file  Occupational History  . Not on file  Social Needs  . Financial resource strain: Not on file  . Food insecurity:    Worry: Not on file    Inability: Not on file  . Transportation needs:    Medical: Not on file    Non-medical: Not on file  Tobacco Use  . Smoking status: Former Smoker    Packs/day: 0.50    Types: Cigarettes  . Smokeless tobacco: Never Used  Substance and Sexual Activity  . Alcohol use: No  . Drug use: No  . Sexual activity: Yes    Birth control/protection: None, Surgical    Comment: pregnant  Lifestyle  . Physical activity:    Days per week: Not on file    Minutes per session: Not on file  . Stress: Not on file  Relationships  . Social connections:    Talks on phone: Not on file    Gets together: Not on file    Attends religious service: Not on file    Active member of club or organization: Not on file    Attends meetings of clubs or organizations: Not on file    Relationship status: Not on file  . Intimate partner violence:    Fear of current or ex partner: Not on file   Emotionally abused: Not on file    Physically abused: Not on file    Forced sexual activity: Not on file  Other Topics Concern  . Not on file  Social History Narrative  . Not on file   No outpatient medications have been marked as taking for the 02/06/18 encounter [facility](Hospital Encounter).   No Known Allergies    ROS: As per HPI, remainder of ROS negative.   OBJECTIVE:   Vitals:   02/06/18 1739 02/06/18 1742  BP: 120/71   Pulse: 88   Resp: 18   Temp: 97.8 F (36.6 C)   TempSrc: Oral   SpO2: 100%   Weight:  252 lb (114.3 kg)     General appearance: alert; no distress Eyes: PERRL; EOMI; conjunctiva normal HENT: normocephalic; atraumatic; TMs show mild serous otitis changes, canal normal, external ears normal without trauma; nasal mucosa normal; oral mucosa normal Neck: supple Back: no CVA tenderness Extremities: no cyanosis or edema; symmetrical with no gross deformities Skin: warm and dry Neurologic: normal gait; grossly normal Psychological: alert and cooperative; normal mood and affect      Labs:  Results for orders placed or performed  during the hospital encounter of 12/19/17  Lipase, blood  Result Value Ref Range   Lipase 32 11 - 51 U/L  Comprehensive metabolic panel  Result Value Ref Range   Sodium 135 135 - 145 mmol/L   Potassium 4.2 3.5 - 5.1 mmol/L   Chloride 107 101 - 111 mmol/L   CO2 24 22 - 32 mmol/L   Glucose, Bld 91 65 - 99 mg/dL   BUN 16 6 - 20 mg/dL   Creatinine, Ser 1.61 0.44 - 1.00 mg/dL   Calcium 8.9 8.9 - 09.6 mg/dL   Total Protein 6.8 6.5 - 8.1 g/dL   Albumin 3.5 3.5 - 5.0 g/dL   AST 25 15 - 41 U/L   ALT 22 14 - 54 U/L   Alkaline Phosphatase 74 38 - 126 U/L   Total Bilirubin 0.8 0.3 - 1.2 mg/dL   GFR calc non Af Amer >60 >60 mL/min   GFR calc Af Amer >60 >60 mL/min   Anion gap 4 (L) 5 - 15  CBC  Result Value Ref Range   WBC 6.7 4.0 - 10.5 K/uL   RBC 4.23 3.87 - 5.11 MIL/uL   Hemoglobin 12.6 12.0 - 15.0 g/dL   HCT 04.5 40.9 -  81.1 %   MCV 94.3 78.0 - 100.0 fL   MCH 29.8 26.0 - 34.0 pg   MCHC 31.6 30.0 - 36.0 g/dL   RDW 91.4 78.2 - 95.6 %   Platelets 316 150 - 400 K/uL  Urinalysis, Routine w reflex microscopic  Result Value Ref Range   Color, Urine YELLOW YELLOW   APPearance HAZY (A) CLEAR   Specific Gravity, Urine 1.014 1.005 - 1.030   pH 5.0 5.0 - 8.0   Glucose, UA NEGATIVE NEGATIVE mg/dL   Hgb urine dipstick NEGATIVE NEGATIVE   Bilirubin Urine NEGATIVE NEGATIVE   Ketones, ur NEGATIVE NEGATIVE mg/dL   Protein, ur NEGATIVE NEGATIVE mg/dL   Nitrite NEGATIVE NEGATIVE   Leukocytes, UA TRACE (A) NEGATIVE   RBC / HPF 0-5 0 - 5 RBC/hpf   WBC, UA 0-5 0 - 5 WBC/hpf   Bacteria, UA RARE (A) NONE SEEN   Squamous Epithelial / LPF 11-20 0 - 5   Mucus PRESENT   I-Stat beta hCG blood, ED  Result Value Ref Range   I-stat hCG, quantitative <5.0 <5 mIU/mL   Comment 3            Labs Reviewed - No data to display  No results found.     ASSESSMENT & PLAN:  1. Tinnitus aurium, right     Meds ordered this encounter  Medications  . fluticasone (FLONASE) 50 MCG/ACT nasal spray    Sig: Place 2 sprays into both nostrils daily.    Dispense:  9.9 g    Refill:  12    Reviewed expectations re: course of current medical issues. Questions answered. Outlined signs and symptoms indicating need for more acute intervention. Patient verbalized understanding. After Visit Summary given.     Elvina Sidle, MD 02/06/18 Flossie Buffy

## 2018-02-06 NOTE — ED Notes (Signed)
No answer when called to treatment room.  

## 2018-02-06 NOTE — ED Triage Notes (Signed)
Pt c/o ear has a ringing sound and headache 1 week.

## 2018-03-06 ENCOUNTER — Encounter (HOSPITAL_COMMUNITY): Payer: Self-pay

## 2018-03-06 DIAGNOSIS — M545 Low back pain: Secondary | ICD-10-CM | POA: Insufficient documentation

## 2018-03-06 DIAGNOSIS — Z87891 Personal history of nicotine dependence: Secondary | ICD-10-CM | POA: Diagnosis not present

## 2018-03-06 NOTE — ED Triage Notes (Signed)
Pt c/o lower back pain. Pt reports that she tripped and fell today. Pt reports that she took some tylenol and was not effective in managing her pain. Pt does report that she has RT thigh pain 7/10 pain aches.

## 2018-03-07 ENCOUNTER — Emergency Department (HOSPITAL_COMMUNITY)
Admission: EM | Admit: 2018-03-07 | Discharge: 2018-03-07 | Disposition: A | Discharge status: Home / Self Care | Payer: Medicaid Other | Attending: Emergency Medicine | Admitting: Emergency Medicine

## 2018-03-07 ENCOUNTER — Other Ambulatory Visit: Payer: Self-pay

## 2018-03-07 ENCOUNTER — Emergency Department (HOSPITAL_COMMUNITY): Payer: Medicaid Other

## 2018-03-07 DIAGNOSIS — M545 Low back pain, unspecified: Secondary | ICD-10-CM

## 2018-03-07 LAB — POC URINE PREG, ED: Preg Test, Ur: NEGATIVE

## 2018-03-07 MED ORDER — METHOCARBAMOL 500 MG PO TABS: 500.0000 mg | ORAL_TABLET | Freq: Two times a day (BID) | ORAL | 0 refills | Status: DC | PRN

## 2018-03-07 MED ORDER — KETOROLAC TROMETHAMINE 15 MG/ML IJ SOLN
60.0000 mg | Freq: Once | INTRAMUSCULAR | Status: DC
  Filled 2018-03-07: qty 4

## 2018-03-07 MED ORDER — KETOROLAC TROMETHAMINE 60 MG/2ML IM SOLN
60.0000 mg | Freq: Once | INTRAMUSCULAR | Status: AC
  Administered 2018-03-07: 60 mg via INTRAMUSCULAR
  Filled 2018-03-07: qty 2

## 2018-03-07 MED ORDER — LIDOCAINE 5 % EX PTCH
1.0000 | MEDICATED_PATCH | CUTANEOUS | Status: DC
  Administered 2018-03-07: 1 via TRANSDERMAL
  Filled 2018-03-07: qty 1

## 2018-03-07 MED ORDER — NAPROXEN 500 MG PO TABS: 500.0000 mg | ORAL_TABLET | Freq: Two times a day (BID) | ORAL | 0 refills | Status: DC | PRN

## 2018-03-07 NOTE — ED Notes (Signed)
Patient transported to X-ray 

## 2018-03-07 NOTE — Discharge Instructions (Signed)
Alternate ice and heat to areas of injury 3-4 times per day to limit inflammation and spasm.  Avoid strenuous activity and heavy lifting.  We recommend consistent use of naproxen in addition to Robaxin for muscle spasms. Do not drive or drink alcohol after taking Robaxin as it may make you drowsy and impair your judgment.  We recommend follow-up with a primary care doctor to ensure resolution of symptoms.  Return to the ED for any new or concerning symptoms. 

## 2018-03-07 NOTE — ED Provider Notes (Signed)
[facility]-EMERGENCY DEPT Provider Note   CSN: [id number] Arrival date & time: 03/06/18  2220     History   Chief Complaint Chief Complaint  Patient presents with  . Back Pain    HPI Joann King is a 27 y.o. female.  27 year old female with a history of anemia presents to the emergency department for evaluation of low back pain.  She states that she was walking to her car when she tripped over the curb.  This caused her to fall on her bottom and low back.  Reports worsening pain with ambulation.  It has been constant and unrelieved with Tylenol.  No bowel or bladder incontinence, inability to ambulate, extremity numbness.  The history is provided by the patient. No language interpreter was used.  Back Pain      Past Medical History:  Diagnosis Date  . Anemia    during first pregnancy  . Ectopic pregnancy, tubal   . Heartburn in pregnancy   . Herpes genitalia    doesn't think ever had outbreak  . Pregnancy induced hypertension     Patient Active Problem List   Diagnosis Date Noted  . Anemia 08/15/2016  . Gestational hypertension 07/20/2016  . Family history of mother as victim of domestic violence 07/20/2016  . Substance abuse (HCC) 07/20/2016  . History of cesarean delivery 10/07/2013  . Alcohol use complicating pregnancy in third trimester 10/07/2013    Past Surgical History:  Procedure Laterality Date  . CESAREAN SECTION  2009  . CESAREAN SECTION N/A 10/07/2013   Procedure: CESAREAN SECTION;  Surgeon: Lesly Dukes, MD;  Location: [facility];  Service: Obstetrics;  Laterality: N/A;  . CESAREAN SECTION WITH BILATERAL TUBAL LIGATION N/A 08/19/2016   Procedure: CESAREAN SECTION;  Surgeon: Catalina Antigua, MD;  [facility];  Service: Obstetrics;  Laterality: N/A;  . DIAGNOSTIC LAPAROSCOPY WITH REMOVAL OF ECTOPIC PREGNANCY Left 08/02/2015   Procedure: DIAGNOSTIC LAPAROSCOPY WITH REMOVAL OF ECTOPIC PREGNANCY;  Surgeon: Catalina Antigua, MD;  Location: [facility];  Service: Gynecology;  Laterality: Left;  for ectopic   . TUBAL LIGATION Bilateral 08/19/2016   Procedure: BILATERAL TUBAL LIGATION;  Surgeon: Catalina Antigua, MD;  [facility];  Service: Obstetrics;  Laterality: Bilateral;     OB History    Gravida  5   Para  3   Term  3   Preterm      AB  2   Living  3     SAB      TAB  1   Ectopic  1   Multiple  0   Live Births  3            Home Medications    Prior to Admission medications   Medication Sig Start Date End Date Taking? Authorizing Provider  fluticasone (FLONASE) 50 MCG/ACT nasal spray Place 2 sprays into both nostrils daily. 02/06/18   Elvina Sidle, MD  methocarbamol (ROBAXIN) 500 MG tablet Take 1 tablet (500 mg total) by mouth every 12 (twelve) hours as needed for muscle spasms. 03/07/18   Antony Madura, PA-C  naproxen (NAPROSYN) 500 MG tablet Take 1 tablet (500 mg total) by mouth every 12 (twelve) hours as needed for mild pain or moderate pain. 03/07/18   Antony Madura, PA-C    Family History Family History  Problem Relation Age of Onset  . Diabetes Maternal Grandmother   . Hypertension Maternal Grandmother     Social History Social History  Tobacco Use  . Smoking status: Former Smoker    Packs/day: 0.50    Types: Cigarettes  . Smokeless tobacco: Never Used  Substance Use Topics  . Alcohol use: No  . Drug use: No     Allergies   Patient has no known allergies.   Review of Systems Review of Systems  Musculoskeletal: Positive for back pain.  Ten systems reviewed and are negative for acute change, except as noted in the HPI.    Physical Exam Updated Vital Signs Pulse 80   Temp 98.9 F (37.2 C) (Oral)   Ht 5\' 6"  (1.676 m)   Wt 109.8 kg   LMP 02/25/2018   BMI 39.06 kg/m   Physical Exam  Constitutional: She is oriented to person, place, and time. She appears well-developed and well-nourished. No distress.  Nontoxic appearing and in no  distress  HENT:  Head: Normocephalic and atraumatic.  Eyes: Conjunctivae and EOM are normal. No scleral icterus.  Neck: Normal range of motion.  Pulmonary/Chest: Effort normal. No respiratory distress.  Respirations even and unlabored  Musculoskeletal: Normal range of motion.  Tenderness to palpation of the lower lumbar midline.  No bony deformities, step-offs, crepitus.  Neurological: She is alert and oriented to person, place, and time. She exhibits normal muscle tone. Coordination normal.  Sensation to light touch intact.  Ambulatory with antalgic gait.  Skin: Skin is warm and dry. No rash noted. She is not diaphoretic. No erythema. No pallor.  Psychiatric: She has a normal mood and affect. Her behavior is normal.  Nursing note and vitals reviewed.    ED Treatments / Results  Labs (all labs ordered are listed, but only abnormal results are displayed) Labs Reviewed  POC URINE PREG, ED    EKG None  Radiology Dg Lumbar Spine Complete  Result Date: 03/07/2018 CLINICAL DATA:  Initial evaluation for acute low back pain status post fall. EXAM: LUMBAR SPINE - COMPLETE 4+ VIEW COMPARISON:  None. FINDINGS: There is no evidence of lumbar spine fracture. Alignment is normal. Intervertebral disc spaces are maintained. IMPRESSION: Negative. Electronically Signed   By: Rise Mu M.D.   On: 03/07/2018 01:37    Procedures Procedures (including critical care time)  Medications Ordered in ED Medications  lidocaine (LIDODERM) 5 % 1 patch (1 patch Transdermal Patch Applied 03/07/18 0122)  ketorolac (TORADOL) 15 MG/ML injection 60 mg (has no administration in time range)  ketorolac (TORADOL) injection 60 mg (60 mg Intramuscular Given 03/07/18 0124)     Initial Impression / Assessment and Plan / ED Course  I have reviewed the triage vital signs and the nursing notes.  Pertinent labs & imaging results that were available during my care of the patient were reviewed by me and considered  in my medical decision making (see chart for details).     Patient with back pain after fall earlier today.  She is neurovascularly intact.  Patient can walk but states is painful.  No loss of bowel or bladder control.  No concern for cauda equina.  X-rays reviewed which show no evidence of acute fracture, bony deformity.  Plan for continued outpatient supportive care.  Return precautions discussed and provided. Patient discharged in stable condition with no unaddressed concerns.   Final Clinical Impressions(s) / ED Diagnoses   Final diagnoses:  Acute bilateral low back pain without sciatica    ED Discharge Orders         Ordered    naproxen (NAPROSYN) 500 MG tablet  Every 12 hours  PRN     03/07/18 0141    methocarbamol (ROBAXIN) 500 MG tablet  Every 12 hours PRN     03/07/18 0141           Antony Madura, PA-C 03/07/18 0154    Dione Booze, MD 03/07/18 [phone]

## 2018-03-07 NOTE — ED Notes (Signed)
Bed: WA02 Expected date:  Expected time:  Means of arrival:  Comments: 

## 2018-03-11 DIAGNOSIS — H903 Sensorineural hearing loss, bilateral: Secondary | ICD-10-CM

## 2018-03-11 DIAGNOSIS — H9312 Tinnitus, left ear: Secondary | ICD-10-CM | POA: Insufficient documentation

## 2018-03-11 HISTORY — DX: Tinnitus, left ear: H93.12

## 2018-03-11 HISTORY — DX: Sensorineural hearing loss, bilateral: H90.3

## 2018-03-13 ENCOUNTER — Other Ambulatory Visit: Payer: Self-pay | Admitting: Physician Assistant

## 2018-03-13 DIAGNOSIS — H903 Sensorineural hearing loss, bilateral: Secondary | ICD-10-CM

## 2018-03-13 DIAGNOSIS — H9312 Tinnitus, left ear: Secondary | ICD-10-CM

## 2018-03-13 DIAGNOSIS — H905 Unspecified sensorineural hearing loss: Secondary | ICD-10-CM

## 2018-03-20 ENCOUNTER — Emergency Department (HOSPITAL_COMMUNITY)
Admission: EM | Admit: 2018-03-20 | Discharge: 2018-03-21 | Disposition: A | Discharge status: Home / Self Care | Payer: Medicaid Other | Attending: Emergency Medicine | Admitting: Emergency Medicine

## 2018-03-20 ENCOUNTER — Encounter (HOSPITAL_COMMUNITY): Payer: Self-pay | Admitting: Obstetrics and Gynecology

## 2018-03-20 ENCOUNTER — Other Ambulatory Visit: Payer: Self-pay

## 2018-03-20 DIAGNOSIS — M545 Low back pain, unspecified: Secondary | ICD-10-CM

## 2018-03-20 DIAGNOSIS — Z87891 Personal history of nicotine dependence: Secondary | ICD-10-CM | POA: Insufficient documentation

## 2018-03-20 NOTE — ED Triage Notes (Signed)
PT presents to the ED with c/o back pain. PT reports she was recent seen here for the same and prescribed a muscle relaxer and pain medicine that is not helping. PT reports she had an episode of almost losing her control of her bladder when she was attempting to get out of the bed today. Pt reports she is unsure what is causing the pain.

## 2018-03-21 MED ORDER — LIDOCAINE 5 % EX PTCH: 1.0000 | MEDICATED_PATCH | CUTANEOUS | 0 refills | Status: DC

## 2018-03-21 MED ORDER — LIDOCAINE 5 % EX PTCH
1.0000 | MEDICATED_PATCH | CUTANEOUS | Status: DC
  Administered 2018-03-21: 1 via TRANSDERMAL
  Filled 2018-03-21: qty 1

## 2018-03-21 MED ORDER — KETOROLAC TROMETHAMINE 60 MG/2ML IM SOLN
15.0000 mg | Freq: Once | INTRAMUSCULAR | Status: AC
  Administered 2018-03-21: 15 mg via INTRAMUSCULAR
  Filled 2018-03-21: qty 2

## 2018-03-21 NOTE — ED Provider Notes (Signed)
[facility] Provider Note   CSN: [id number] Arrival date & time: 03/20/18  2155     History   Chief Complaint Chief Complaint  Patient presents with  . Back Pain    HPI Joann King is a 27 y.o. female.  The history is provided by medical records and the patient. No language interpreter was used.  Back Pain     Joann King is a 27 y.o. female  with a PMH as listed below who presents to the Emergency Department complaining of lateral low back pain since yesterday.  Patient states she had a similar episode on 9/05 where she sought care in the emergency department.  She felt better after a few days.  Her symptoms returned last night.  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  When she got home, she noticed diffuse pain across her low back.  She tried naproxen and Robaxin with some relief, but not much.  Pain is worse with certain movements and ambulation. Patient denies upper back or neck pain. No fever, saddle anesthesia, weakness, numbness, urinary complaints including retention/incontinence. No history of cancer, IVDU, or recent spinal procedures.  Past Medical History:  Diagnosis Date  . Anemia    during first pregnancy  . Ectopic pregnancy, tubal   . Heartburn in pregnancy   . Herpes genitalia    doesn't think ever had outbreak  . Pregnancy induced hypertension     Patient Active Problem List   Diagnosis Date Noted  . Anemia 08/15/2016  . Gestational hypertension 07/20/2016  . Family history of mother as victim of domestic violence 07/20/2016  . Substance abuse (HCC) 07/20/2016  . History of cesarean delivery 10/07/2013  . Alcohol use complicating pregnancy in third trimester 10/07/2013    Past Surgical History:  Procedure Laterality Date  . CESAREAN SECTION  2009  . CESAREAN SECTION N/A 10/07/2013   Procedure: CESAREAN SECTION;  Surgeon: Lesly Dukes,  MD;  Location: [facility];  Service: Obstetrics;  Laterality: N/A;  . CESAREAN SECTION WITH BILATERAL TUBAL LIGATION N/A 08/19/2016   Procedure: CESAREAN SECTION;  Surgeon: Catalina Antigua, MD;  Location: [facility];  Service: Obstetrics;  Laterality: N/A;  . DIAGNOSTIC LAPAROSCOPY WITH REMOVAL OF ECTOPIC PREGNANCY Left 08/02/2015   Procedure: DIAGNOSTIC LAPAROSCOPY WITH REMOVAL OF ECTOPIC PREGNANCY;  Surgeon: Catalina Antigua, MD;  Location: [facility];  Service: Gynecology;  Laterality: Left;  for ectopic   . TUBAL LIGATION Bilateral 08/19/2016   Procedure: BILATERAL TUBAL LIGATION;  Surgeon: Catalina Antigua, MD;  Location: [facility];  Service: Obstetrics;  Laterality: Bilateral;     OB History    Gravida  5   Para  3   Term  3   Preterm      AB  2   Living  3     SAB      TAB  1   Ectopic  1   Multiple  0   Live Births  3            Home Medications    Prior to Admission medications   Medication Sig Start Date End Date Taking? Authorizing Provider  diphenhydrAMINE-Phenylephrine (THERAFLU COLD/COUGH NIGHTTIME PO) Take 30 mLs by mouth at bedtime as needed (cold symptoms).   Yes [provider]  methocarbamol (ROBAXIN) 500 MG tablet Take 1 tablet (500 mg total) by mouth every 12 (  twelve) hours as needed for muscle spasms. 03/07/18  Yes Antony MaduraHumes, Kelly, PA-C  naproxen (NAPROSYN) 500 MG tablet Take 1 tablet (500 mg total) by mouth every 12 (twelve) hours as needed for mild pain or moderate pain. 03/07/18  Yes Antony MaduraHumes, Kelly, PA-C  fluticasone (FLONASE) 50 MCG/ACT nasal spray Place 2 sprays into both nostrils daily. Patient not taking: Reported on 03/20/2018 02/06/18   Elvina SidleLauenstein, Kurt, MD  lidocaine (LIDODERM) 5 % Place 1 patch onto the skin daily. Remove & Discard patch within 12 hours or as directed by MD 03/21/18   Ward, Chase PicketJaime Pilcher, PA-C    Family History Family History  Problem Relation Age of Onset  . Diabetes Maternal Grandmother   . Hypertension Maternal  Grandmother     Social History Social History   Tobacco Use  . Smoking status: Former Smoker    Packs/day: 0.50    Types: Cigarettes  . Smokeless tobacco: Never Used  Substance Use Topics  . Alcohol use: No  . Drug use: No     Allergies   Patient has no known allergies.   Review of Systems Review of Systems  Musculoskeletal: Positive for back pain.  All other systems reviewed and are negative.    Physical Exam Updated Vital Signs BP (!) 127/59 (BP Location: Right Arm)   Pulse 83   Temp 98.3 F (36.8 C) (Oral)   Resp 17   LMP 02/25/2018   SpO2 100%   Physical Exam  Constitutional: She is oriented to person, place, and time. She appears well-developed and well-nourished.  Neck:  No midline or paraspinal tenderness. Full ROM without pain.  Cardiovascular: Normal rate, regular rhythm, normal heart sounds and intact distal pulses.  Pulmonary/Chest: Effort normal and breath sounds normal. No respiratory distress.  Abdominal: Soft. Bowel sounds are normal. She exhibits no distension. There is no tenderness.  Musculoskeletal:       Back:  Tenderness to palpation across low back as depicted in image. 5/5 muscle strength in bilateral LE's. Straight leg raises are negative bilaterally for radicular symptoms. Able to ambulate independently with steady gait.  Neurological: She is alert and oriented to person, place, and time. She has normal reflexes.  Bilateral lower extremities neurovascularly intact.  Skin: Skin is warm and dry. No rash noted. No erythema.  Nursing note and vitals reviewed.    ED Treatments / Results  Labs (all labs ordered are listed, but only abnormal results are displayed) Labs Reviewed - No data to display  EKG None  Radiology No results found.  Procedures Procedures (including critical care time)  Medications Ordered in ED Medications  lidocaine (LIDODERM) 5 % 1 patch (1 patch Transdermal Patch Applied 03/21/18 0037)  ketorolac  (TORADOL) injection 15 mg (15 mg Intramuscular Given 03/21/18 0037)     Initial Impression / Assessment and Plan / ED Course  I have reviewed the triage vital signs and the nursing notes.  Pertinent labs & imaging results that were available during my care of the patient were reviewed by me and considered in my medical decision making (see chart for details).    Piedad ClimesDiesha Burnell BlanksM Boom is a 27 y.o. female who presents to ED for low back pain.   Patient demonstrates no lower extremity weakness, saddle anesthesia, bowel or bladder incontinence or neuro deficits. No concern for cauda equina. No fevers or other infectious symptoms to suggest that the patient's back pain is due to an infection. Lower extremities are neurovascularly intact and patient is ambulating without difficulty.  X-ray performed on 9/05 of L-spine which was negative. Evaluation does not show pathology that would require ongoing emergent intervention or inpatient treatment. I have reviewed return precautions, including the development of any of these signs or symptoms and the patient has voiced understanding. I reviewed symptomatic home care instructions and PCP follow-up if symptoms do not improve. RX for lidoderm patches given. Add tylenol into current pain regimen. Patient voiced understanding and agreement with plan. All questions answered.   Final Clinical Impressions(s) / ED Diagnoses   Final diagnoses:  Acute bilateral low back pain without sciatica    ED Discharge Orders         Ordered    lidocaine (LIDODERM) 5 %  Every 24 hours     03/21/18 0046           Ward, Chase Picket, PA-C 03/21/18 5284    Geoffery Lyons, MD 03/21/18 [phone]

## 2018-03-21 NOTE — Discharge Instructions (Signed)
It was my pleasure taking care of you today!   You have been seen in the Emergency Department today for back pain.   Continue taking your naproxen as needed for pain. In addition to this, you can take 500-1000mg  of Tylenol every 8 hours as needed for pain. Continue taking your muscle relaxer at night. In addition to this, use ice and/or heat for additional pain relief.  Your back pain should get better over the next 2 weeks. Please follow up with your doctor this week for a recheck if still having symptoms.  COLD THERAPY DIRECTIONS:  Ice or gel packs can be used to reduce both pain and swelling. Ice is the most helpful within the first 24 to 48 hours after an injury or flareup from overusing a muscle or joint.  Ice is effective, has very few side effects, and is safe for most people to use.    Return to the ED for worsening back pain, fever, weakness or numbness of either leg, or if you develop either (1) an inability to urinate or have bowel movements, or (2) loss of your ability to control your bathroom functions (if you start having "accidents"), or if you develop other new symptoms that concern you.

## 2018-03-30 ENCOUNTER — Ambulatory Visit
Admission: RE | Admit: 2018-03-30 | Discharge: 2018-03-30 | Disposition: A | Discharge status: Home / Self Care | Payer: Medicaid Other | Source: Ambulatory Visit | Attending: Physician Assistant | Admitting: Physician Assistant

## 2018-03-30 DIAGNOSIS — H9312 Tinnitus, left ear: Secondary | ICD-10-CM

## 2018-03-30 DIAGNOSIS — H905 Unspecified sensorineural hearing loss: Secondary | ICD-10-CM

## 2018-03-30 DIAGNOSIS — H903 Sensorineural hearing loss, bilateral: Secondary | ICD-10-CM

## 2018-03-30 MED ORDER — GADOBENATE DIMEGLUMINE 529 MG/ML IV SOLN
20.0000 mL | Freq: Once | INTRAVENOUS | Status: AC | PRN
  Administered 2018-03-30: 20 mL via INTRAVENOUS

## 2018-04-06 ENCOUNTER — Encounter (HOSPITAL_COMMUNITY): Payer: Self-pay | Admitting: *Deleted

## 2018-04-06 ENCOUNTER — Ambulatory Visit (HOSPITAL_COMMUNITY)
Admission: EM | Admit: 2018-04-06 | Discharge: 2018-04-06 | Disposition: A | Discharge status: Home / Self Care | Payer: Medicaid Other | Attending: Family Medicine | Admitting: Family Medicine

## 2018-04-06 DIAGNOSIS — S93402A Sprain of unspecified ligament of left ankle, initial encounter: Secondary | ICD-10-CM

## 2018-04-06 HISTORY — DX: Dorsalgia, unspecified: M54.9

## 2018-04-06 NOTE — Discharge Instructions (Signed)
Ankle swelling likely sprain/overuse Please wear brace for support Ice and elevate at home Use anti-inflammatories for pain/swelling. You may take up to 800 mg Ibuprofen every 8 hours with food. You may supplement Ibuprofen with Tylenol [phone] mg every 8 hours. OR Naprosyn twice daily Please be sure your shoes fit well and are not worn out  Follow up if developing increased pain, swelling, redness, calf pain, chest pain or shortness of breath

## 2018-04-06 NOTE — ED Triage Notes (Signed)
C/O left ankle swelling x 2 days without injury.  C/O pain with weight bearing.

## 2018-04-06 NOTE — ED Provider Notes (Signed)
[facility]    CSN: [id number] Arrival date & time: 04/06/18  1526     History   Chief Complaint Chief Complaint  Patient presents with  . Joint Swelling    HPI Joann King is a 27 y.o. female no contributing past medical history presenting today for evaluation of left ankle pain and swelling.  Patient states that for the past 2 days she has had increased swelling and discomfort in her ankle.  She denies any specific injury.  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  She states that she typically wears [employer]'s for her job, but believes that these are not supportive for her.  Denies her like ankle.  Denies calf pain.  Denies previous DVT/PE.  Denies OCP use.  Patient is a current smoker.  Denies recent hospitalization, immobilization or surgery.  Denies chest pain or shortness of breath.  She has not taken any medicines.  She has tried icing and elevating her foot.  Symptoms have improved since last night after doing this.  HPI  Past Medical History:  Diagnosis Date  . Anemia    during first pregnancy  . Back pain   . Ectopic pregnancy, tubal   . Heartburn in pregnancy   . Herpes genitalia    doesn't think ever had outbreak  . Pregnancy induced hypertension     Patient Active Problem List   Diagnosis Date Noted  . Anemia 08/15/2016  . Gestational hypertension 07/20/2016  . Family history of mother as victim of domestic violence 07/20/2016  . Substance abuse (HCC) 07/20/2016  . History of cesarean delivery 10/07/2013  . Alcohol use complicating pregnancy in third trimester 10/07/2013    Past Surgical History:  Procedure Laterality Date  . CESAREAN SECTION  2009  . CESAREAN SECTION N/A 10/07/2013   Procedure: CESAREAN SECTION;  Surgeon: Lesly Dukes, MD;  Location: [facility];  Service: Obstetrics;  Laterality: N/A;  . CESAREAN SECTION WITH BILATERAL TUBAL LIGATION N/A 08/19/2016   Procedure: CESAREAN  SECTION;  Surgeon: Catalina Antigua, MD;  Location: [facility];  Service: Obstetrics;  Laterality: N/A;  . DIAGNOSTIC LAPAROSCOPY WITH REMOVAL OF ECTOPIC PREGNANCY Left 08/02/2015   Procedure: DIAGNOSTIC LAPAROSCOPY WITH REMOVAL OF ECTOPIC PREGNANCY;  Surgeon: Catalina Antigua, MD;  Location: [facility];  Service: Gynecology;  Laterality: Left;  for ectopic   . TUBAL LIGATION Bilateral 08/19/2016   Procedure: BILATERAL TUBAL LIGATION;  Surgeon: Catalina Antigua, MD;  Location: [facility];  Service: Obstetrics;  Laterality: Bilateral;    OB History    Gravida  5   Para  3   Term  3   Preterm      AB  2   Living  3     SAB      TAB  1   Ectopic  1   Multiple  0   Live Births  3            Home Medications    Prior to Admission medications   Medication Sig Start Date End Date Taking? Authorizing Provider  lidocaine (LIDODERM) 5 % Place 1 patch onto the skin daily. Remove & Discard patch within 12 hours or as directed by MD 03/21/18   Ward, Chase Picket, PA-C  methocarbamol (ROBAXIN) 500 MG tablet Take 1 tablet (500 mg total) by mouth every 12 (twelve) hours as needed for muscle spasms. 03/07/18   Antony Madura, PA-C  naproxen (NAPROSYN) 500 MG tablet Take 1 tablet (500 mg total) by mouth every 12 (twelve) hours as needed for mild pain or moderate pain. 03/07/18   Antony Madura, PA-C    Family History Family History  Problem Relation Age of Onset  . Diabetes Maternal Grandmother   . Hypertension Maternal Grandmother     Social History Social History   Tobacco Use  . Smoking status: Former Smoker    Packs/day: 0.50    Types: Cigarettes  . Smokeless tobacco: Never Used  Substance Use Topics  . Alcohol use: No  . Drug use: No     Allergies   Patient has no known allergies.   Review of Systems Review of Systems  Constitutional: Negative for fatigue and fever.  Eyes: Negative for visual disturbance.  Respiratory: Negative for shortness of breath.     Cardiovascular: Negative for chest pain.  Gastrointestinal: Negative for abdominal pain, nausea and vomiting.  Musculoskeletal: Positive for arthralgias, joint swelling and myalgias.  Skin: Negative for color change, rash and wound.  Neurological: Negative for dizziness, weakness, light-headedness and headaches.     Physical Exam Triage Vital Signs ED Triage Vitals  Enc Vitals Group     BP 04/06/18 1548 116/66     Pulse Rate 04/06/18 1547 83     Resp 04/06/18 1547 18     Temp --      Temp src --      SpO2 04/06/18 1547 100 %     Weight --      Height --      Head Circumference --      Peak Flow --      Pain Score 04/06/18 1548 6     Pain Loc --      Pain Edu? --      Excl. in GC? --    No data found.  Updated Vital Signs BP 116/66   Pulse 83   Resp 18   LMP 03/21/2018 (Approximate)   SpO2 100%   Breastfeeding? No   Visual Acuity Right Eye Distance:   Left Eye Distance:   Bilateral Distance:    Right Eye Near:   Left Eye Near:    Bilateral Near:     Physical Exam  Constitutional: She appears well-developed and well-nourished. No distress.  HENT:  Head: Normocephalic and atraumatic.  Eyes: Conjunctivae are normal.  Neck: Neck supple.  Cardiovascular: Normal rate and regular rhythm.  No murmur heard. Pulmonary/Chest: Effort normal and breath sounds normal. No respiratory distress.  CTA BL  Abdominal: Soft. There is no tenderness.  Musculoskeletal: She exhibits no edema.  Minimal swelling and tenderness to left ankle, mild tenderness over medial malleolus, no overt swelling visualized.  No overlying erythema.  Nontender to lateral malleolus and first through fifth metatarsals. No calf tenderness, negative Homans Full active range of motion at ankle and toes Dorsalis pedis 2+, cap refill less than 2 seconds  Neurological: She is alert.  Skin: Skin is warm and dry.  Psychiatric: She has a normal mood and affect.  Nursing note and vitals reviewed.    UC  Treatments / Results  Labs (all labs ordered are listed, but only abnormal results are displayed) Labs Reviewed - No data to display  EKG None  Radiology No results found.  Procedures Procedures (including critical care time)  Medications Ordered in UC Medications - No data to display  Initial Impression / Assessment and Plan / UC Course  I have reviewed the triage  vital signs and the nursing notes.  Pertinent labs & imaging results that were available during my care of the patient were reviewed by me and considered in my medical decision making (see chart for details).     Patient with minimal swelling to left ankle, likely overuse/sprain, will defer imaging at this time given lack of injury and minimal tenderness.  Do not suspect DVT.  Will provide patient with ASO, ice and elevate, anti-inflammatories, discussed proper fitting shoes.  Resting.  Discussed signs and symptoms of DVT if symptoms worsening to return for.Discussed strict return precautions. Patient verbalized understanding and is agreeable with plan.  Final Clinical Impressions(s) / UC Diagnoses   Final diagnoses:  Sprain of left ankle, unspecified ligament, initial encounter     Discharge Instructions     Ankle swelling likely sprain/overuse Please wear brace for support Ice and elevate at home Use anti-inflammatories for pain/swelling. You may take up to 800 mg Ibuprofen every 8 hours with food. You may supplement Ibuprofen with Tylenol [phone] mg every 8 hours. OR Naprosyn twice daily Please be sure your shoes fit well and are not worn out  Follow up if developing increased pain, swelling, redness, calf pain, chest pain or shortness of breath   ED Prescriptions    None     Controlled Substance Prescriptions [place] Controlled Substance Registry consulted? Not Applicable   Lew Dawes, [place] 04/06/18 1805

## 2018-05-10 ENCOUNTER — Encounter (HOSPITAL_COMMUNITY): Payer: Self-pay

## 2018-05-10 ENCOUNTER — Emergency Department (HOSPITAL_COMMUNITY): Payer: Medicaid Other

## 2018-05-10 ENCOUNTER — Emergency Department (HOSPITAL_COMMUNITY)
Admission: EM | Admit: 2018-05-10 | Discharge: 2018-05-10 | Disposition: A | Discharge status: Home / Self Care | Payer: Medicaid Other | Attending: Emergency Medicine | Admitting: Emergency Medicine

## 2018-05-10 ENCOUNTER — Other Ambulatory Visit: Payer: Self-pay

## 2018-05-10 DIAGNOSIS — Z87891 Personal history of nicotine dependence: Secondary | ICD-10-CM | POA: Diagnosis not present

## 2018-05-10 DIAGNOSIS — R42 Dizziness and giddiness: Secondary | ICD-10-CM | POA: Diagnosis present

## 2018-05-10 HISTORY — DX: Dizziness and giddiness: R42

## 2018-05-10 LAB — COMPREHENSIVE METABOLIC PANEL WITH GFR
ALT: 22 U/L (ref 0–44)
AST: 26 U/L (ref 15–41)
Albumin: 3.7 g/dL (ref 3.5–5.0)
Alkaline Phosphatase: 89 U/L (ref 38–126)
Anion gap: 8 (ref 5–15)
BUN: 14 mg/dL (ref 6–20)
CO2: 25 mmol/L (ref 22–32)
Calcium: 8.9 mg/dL (ref 8.9–10.3)
Chloride: 105 mmol/L (ref 98–111)
Creatinine, Ser: 0.9 mg/dL (ref 0.44–1.00)
GFR calc Af Amer: >60 mL/min
GFR calc non Af Amer: >60 mL/min
Glucose, Bld: 75 mg/dL (ref 70–99)
Potassium: 3.9 mmol/L (ref 3.5–5.1)
Sodium: 138 mmol/L (ref 135–145)
Total Bilirubin: 0.7 mg/dL (ref 0.3–1.2)
Total Protein: 7.6 g/dL (ref 6.5–8.1)

## 2018-05-10 LAB — CBC WITH DIFFERENTIAL/PLATELET
Abs Immature Granulocytes: 0.01 10*3/uL (ref 0.00–0.07)
Basophils Absolute: 0 10*3/uL (ref 0.0–0.1)
Basophils Relative: 0 %
Eosinophils Absolute: 0.2 10*3/uL (ref 0.0–0.5)
Eosinophils Relative: 2 %
HCT: 38.9 % (ref 36.0–46.0)
Hemoglobin: 12.6 g/dL (ref 12.0–15.0)
Immature Granulocytes: 0 %
Lymphocytes Relative: 38 %
Lymphs Abs: 3 10*3/uL (ref 0.7–4.0)
MCH: 30.4 pg (ref 26.0–34.0)
MCHC: 32.4 g/dL (ref 30.0–36.0)
MCV: 93.7 fL (ref 80.0–100.0)
Monocytes Absolute: 0.5 10*3/uL (ref 0.1–1.0)
Monocytes Relative: 6 %
Neutro Abs: 4.2 10*3/uL (ref 1.7–7.7)
Neutrophils Relative %: 54 %
Platelets: 362 10*3/uL (ref 150–400)
RBC: 4.15 MIL/uL (ref 3.87–5.11)
RDW: 13 % (ref 11.5–15.5)
WBC: 7.9 10*3/uL (ref 4.0–10.5)
nRBC: 0 % (ref 0.0–0.2)

## 2018-05-10 LAB — URINALYSIS, ROUTINE W REFLEX MICROSCOPIC
Bilirubin Urine: NEGATIVE
Glucose, UA: NEGATIVE mg/dL
Hgb urine dipstick: NEGATIVE
Ketones, ur: NEGATIVE mg/dL
Nitrite: NEGATIVE
Protein, ur: NEGATIVE mg/dL
Specific Gravity, Urine: 1.013 (ref 1.005–1.030)
pH: 6 (ref 5.0–8.0)

## 2018-05-10 LAB — TROPONIN I: Troponin I: <0.03 ng/mL (ref <0.03)

## 2018-05-10 LAB — HCG, QUANTITATIVE, PREGNANCY: hCG, Beta Chain, Quant, S: <1 m[IU]/mL (ref <5)

## 2018-05-10 MED ORDER — SODIUM CHLORIDE 0.9 % IV BOLUS
1000.0000 mL | Freq: Once | INTRAVENOUS | Status: AC
  Administered 2018-05-10: 1000 mL via INTRAVENOUS

## 2018-05-10 NOTE — ED Notes (Signed)
Patient states that she has experienced this before and has been seen for this issue.

## 2018-05-10 NOTE — ED Notes (Signed)
PT c/o dizziness while standing during orthostatic vitals.  

## 2018-05-10 NOTE — ED Notes (Signed)
Pt provided applejuice and graham crackers.

## 2018-05-10 NOTE — ED Notes (Signed)
Bed: UE45 Expected date:  Expected time:  Means of arrival:  Comments: EMS short of breath

## 2018-05-10 NOTE — ED Provider Notes (Signed)
[facility]-EMERGENCY DEPT Provider Note   CSN: [id number] Arrival date & time: 05/10/18  7829     History   Chief Complaint Chief Complaint  Patient presents with  . Dizziness    HPI Joann King is a 27 y.o. female.  HPI Pt was seen at 0720.  Per pt, c/o gradual onset and persistence of multiple intermittent episodes of recurrent "dizziness" for the past 2 years. Pt describes the dizziness as "lighteadedness." Pt denies any other symptoms. Denies vertigo, no CP/palpitations, no SOB/cough, no abd pain, no N/V/D, no visual changes, no focal motor weakness, no tingling/numbness in extremities, no ataxia, no slurred speech, no facial droop.  The symptoms have been associated with no other complaints. The patient has a significant history of similar symptoms previously, recently being evaluated for this complaint and multiple prior evals for same. Pt states she has not f/u with PMD or specialty MD for her symptoms.     Past Medical History:  Diagnosis Date  . Anemia    during first pregnancy  . Back pain   . Dizziness    recurrent  . Ectopic pregnancy, tubal   . Heartburn in pregnancy   . Herpes genitalia    doesn't think ever had outbreak  . Pregnancy induced hypertension     Patient Active Problem List   Diagnosis Date Noted  . Anemia 08/15/2016  . Gestational hypertension 07/20/2016  . Family history of mother as victim of domestic violence 07/20/2016  . Substance abuse (HCC) 07/20/2016  . History of cesarean delivery 10/07/2013  . Alcohol use complicating pregnancy in third trimester 10/07/2013    Past Surgical History:  Procedure Laterality Date  . CESAREAN SECTION  2009  . CESAREAN SECTION N/A 10/07/2013   Procedure: CESAREAN SECTION;  Surgeon: Lesly Dukes, MD;  Location: [facility];  Service: Obstetrics;  Laterality: N/A;  . CESAREAN SECTION WITH BILATERAL TUBAL LIGATION N/A 08/19/2016   Procedure: CESAREAN SECTION;  Surgeon: Catalina Antigua,  MD;  Location: [facility];  Service: Obstetrics;  Laterality: N/A;  . DIAGNOSTIC LAPAROSCOPY WITH REMOVAL OF ECTOPIC PREGNANCY Left 08/02/2015   Procedure: DIAGNOSTIC LAPAROSCOPY WITH REMOVAL OF ECTOPIC PREGNANCY;  Surgeon: Catalina Antigua, MD;  Location: [facility];  Service: Gynecology;  Laterality: Left;  for ectopic   . TUBAL LIGATION Bilateral 08/19/2016   Procedure: BILATERAL TUBAL LIGATION;  Surgeon: Catalina Antigua, MD;  Location: [facility];  Service: Obstetrics;  Laterality: Bilateral;     OB History    Gravida  5   Para  3   Term  3   Preterm      AB  2   Living  3     SAB      TAB  1   Ectopic  1   Multiple  0   Live Births  3            Home Medications    Prior to Admission medications   Medication Sig Start Date End Date Taking? Authorizing Provider  lidocaine (LIDODERM) 5 % Place 1 patch onto the skin daily. Remove & Discard patch within 12 hours or as directed by MD 03/21/18   Ward, Chase Picket, PA-C  methocarbamol (ROBAXIN) 500 MG tablet Take 1 tablet (500 mg total) by mouth every 12 (twelve) hours as needed for muscle spasms. 03/07/18   Antony Madura, PA-C  naproxen (NAPROSYN) 500 MG tablet Take 1 tablet (500 mg total) by mouth every 12 (twelve) hours as needed  for mild pain or moderate pain. 03/07/18   Antony Madura, PA-C    Family History Family History  Problem Relation Age of Onset  . Diabetes Maternal Grandmother   . Hypertension Maternal Grandmother     Social History Social History   Tobacco Use  . Smoking status: Former Smoker    Packs/day: 0.50    Types: Cigarettes  . Smokeless tobacco: Never Used  Substance Use Topics  . Alcohol use: No  . Drug use: No     Allergies   Patient has no known allergies.   Review of Systems Review of Systems ROS: Statement: All systems negative except as marked or noted in the HPI; Constitutional: Negative for fever and chills. ; ; Eyes: Negative for eye pain, redness and discharge. ;  ; ENMT: Negative for ear pain, hoarseness, nasal congestion, sinus pressure and sore throat. ; ; Cardiovascular: Negative for chest pain, palpitations, diaphoresis, dyspnea and peripheral edema. ; ; Respiratory: Negative for cough, wheezing and stridor. ; ; Gastrointestinal: Negative for nausea, vomiting, diarrhea, abdominal pain, blood in stool, hematemesis, jaundice and rectal bleeding. . ; ; Genitourinary: Negative for dysuria, flank pain and hematuria. ; ; Musculoskeletal: Negative for back pain and neck pain. Negative for swelling and trauma.; ; Skin: Negative for pruritus, rash, abrasions, blisters, bruising and skin lesion.; ; Neuro: +lightheadedness. Negative for headache and neck stiffness. Negative for weakness, altered level of consciousness, altered mental status, extremity weakness, paresthesias, involuntary movement, seizure and syncope.       Physical Exam Updated Vital Signs BP 125/81   Pulse 80   Temp 97.7 F (36.5 C)   Resp 15   Ht 5\' 6"  (1.676 m)   Wt 111.6 kg   LMP 04/30/2018 Comment: bilateral tubal ligation  SpO2 96%   BMI 39.71 kg/m    Patient Vitals for the past 24 hrs:  BP Temp Pulse Resp SpO2 Height Weight  05/10/18 1000 (!) 135/97 - 96 20 98 % - -  05/10/18 0930 126/65 - 78 19 99 % - -  05/10/18 0913 128/69 - 79 16 100 % - -  05/10/18 0659 - - - - - 5\' 6"  (1.676 m) 111.6 kg  05/10/18 0650 125/81 97.7 F (36.5 C) 80 15 96 % - -  05/10/18 0649 - - - - 100 % - -      06:54 Orthostatic Vital Signs DS  Orthostatic Lying   BP- Lying: 121/67   Pulse- Lying: 79       Orthostatic Sitting  BP- Sitting: 143/85   Pulse- Sitting: 86       Orthostatic Standing at 0 minutes  BP- Standing at 0 minutes: 130/78   Pulse- Standing at 0 minutes: 86     Physical Exam 0725: Physical examination:  Nursing notes reviewed; Vital signs and O2 SAT reviewed;  Constitutional: Well developed, Well nourished, Well hydrated, In no acute distress; Head:  Normocephalic,  atraumatic; Eyes: EOMI, PERRL, No scleral icterus; ENMT: TM's clear bilat. +edemetous nasal turbinates bilat with clear rhinorrhea. Mouth and pharynx normal, Mucous membranes moist; Neck: Supple, Full range of motion, No lymphadenopathy; Cardiovascular: Regular rate and rhythm, No gallop; Respiratory: Breath sounds clear & equal bilaterally, No wheezes.  Speaking full sentences with ease, Normal respiratory effort/excursion; Chest: Nontender, Movement normal; Abdomen: Soft, Nontender, Nondistended, Normal bowel sounds; Genitourinary: No CVA tenderness; Extremities: Peripheral pulses normal, No tenderness, No edema, No calf edema or asymmetry.; Neuro: AA&Ox3, Major CN grossly intact. No facial droop. Speech clear. No  gross focal motor or sensory deficits in extremities.; Skin: Color normal, Warm, Dry.   ED Treatments / Results  Labs (all labs ordered are listed, but only abnormal results are displayed)   EKG None  Radiology   Procedures Procedures (including critical care time)  Medications Ordered in ED Medications  sodium chloride 0.9 % bolus 1,000 mL (has no administration in time range)     Initial Impression / Assessment and Plan / ED Course  I have reviewed the triage vital signs and the nursing notes.  Pertinent labs & imaging results that were available during my care of the patient were reviewed by me and considered in my medical decision making (see chart for details).  MDM Reviewed: previous chart, nursing note and vitals Reviewed previous: labs and ECG Interpretation: labs, ECG, x-ray and CT scan   ED ECG REPORT   Date: 05/10/2018  Rate: 82   Rhythm: normal sinus rhythm  QRS Axis: normal  Intervals: normal  ST/T Wave abnormalities: normal  Conduction Disutrbances:none  Narrative Interpretation:   Old EKG Reviewed: unchanged; no significant changes from previous EKG dated 09/25/2017. I have personally reviewed the EKG tracing and agree with the computerized  printout as noted.   Results for orders placed or performed during the hospital encounter of 05/10/18  Urinalysis, Routine w reflex microscopic  Result Value Ref Range   Color, Urine YELLOW YELLOW   APPearance CLEAR CLEAR   Specific Gravity, Urine 1.013 1.005 - 1.030   pH 6.0 5.0 - 8.0   Glucose, UA NEGATIVE NEGATIVE mg/dL   Hgb urine dipstick NEGATIVE NEGATIVE   Bilirubin Urine NEGATIVE NEGATIVE   Ketones, ur NEGATIVE NEGATIVE mg/dL   Protein, ur NEGATIVE NEGATIVE mg/dL   Nitrite NEGATIVE NEGATIVE   Leukocytes, UA TRACE (A) NEGATIVE   RBC / HPF 0-5 0 - 5 RBC/hpf   WBC, UA 0-5 0 - 5 WBC/hpf   Bacteria, UA RARE (A) NONE SEEN   Squamous Epithelial / LPF 11-20 0 - 5   Mucus PRESENT   Comprehensive metabolic panel  Result Value Ref Range   Sodium 138 135 - 145 mmol/L   Potassium 3.9 3.5 - 5.1 mmol/L   Chloride 105 98 - 111 mmol/L   CO2 25 22 - 32 mmol/L   Glucose, Bld 75 70 - 99 mg/dL   BUN 14 6 - 20 mg/dL   Creatinine, Ser 4.78 0.44 - 1.00 mg/dL   Calcium 8.9 8.9 - 29.5 mg/dL   Total Protein 7.6 6.5 - 8.1 g/dL   Albumin 3.7 3.5 - 5.0 g/dL   AST 26 15 - 41 U/L   ALT 22 0 - 44 U/L   Alkaline Phosphatase 89 38 - 126 U/L   Total Bilirubin 0.7 0.3 - 1.2 mg/dL   GFR calc non Af Amer >60 >60 mL/min   GFR calc Af Amer >60 >60 mL/min   Anion gap 8 5 - 15  CBC with Differential  Result Value Ref Range   WBC 7.9 4.0 - 10.5 K/uL   RBC 4.15 3.87 - 5.11 MIL/uL   Hemoglobin 12.6 12.0 - 15.0 g/dL   HCT 62.1 30.8 - 65.7 %   MCV 93.7 80.0 - 100.0 fL   MCH 30.4 26.0 - 34.0 pg   MCHC 32.4 30.0 - 36.0 g/dL   RDW 84.6 96.2 - 95.2 %   Platelets 362 150 - 400 K/uL   nRBC 0.0 0.0 - 0.2 %   Neutrophils Relative % 54 %   Neutro  Abs 4.2 1.7 - 7.7 K/uL   Lymphocytes Relative 38 %   Lymphs Abs 3.0 0.7 - 4.0 K/uL   Monocytes Relative 6 %   Monocytes Absolute 0.5 0.1 - 1.0 K/uL   Eosinophils Relative 2 %   Eosinophils Absolute 0.2 0.0 - 0.5 K/uL   Basophils Relative 0 %   Basophils  Absolute 0.0 0.0 - 0.1 K/uL   Immature Granulocytes 0 %   Abs Immature Granulocytes 0.01 0.00 - 0.07 K/uL  Troponin I  Result Value Ref Range   Troponin I <0.03 <0.03 ng/mL  hCG, quantitative, pregnancy  Result Value Ref Range   hCG, Beta Chain, Quant, S <1 <5 mIU/mL   Dg Chest 2 View Result Date: 05/10/2018 CLINICAL DATA:  Dizziness down EXAM: CHEST - 2 VIEW COMPARISON:  04/08/2010 FINDINGS: The heart size and mediastinal contours are within normal limits. Both lungs are clear. The visualized skeletal structures are unremarkable. IMPRESSION: No active cardiopulmonary disease. Electronically Signed   By: Kennith Center M.D.   On: 05/10/2018 08:16   Ct Head Wo Contrast Result Date: 05/10/2018 CLINICAL DATA:  Intermittent weakness, dizziness and headache. EXAM: CT HEAD WITHOUT CONTRAST TECHNIQUE: Contiguous axial images were obtained from the base of the skull through the vertex without intravenous contrast. COMPARISON:  Brain MRI 03/30/2018. FINDINGS: Brain: No evidence of acute infarction, hemorrhage, hydrocephalus, extra-axial collection or mass lesion/mass effect. Vascular: No hyperdense vessel or unexpected calcification. Skull: Normal. Negative for fracture or focal lesion. Sinuses/Orbits: Normal. Other: None. IMPRESSION: Normal head CT. Electronically Signed   By: Drusilla Kanner M.D.   On: 05/10/2018 08:18     1040: Udip contaminated. Workup otherwise reassuring. Pt has tol PO well without N/V. Pt has ambulated with steady gait, easy resps, NAD. Not orthostatic on VS. Pt strongly encouraged to obtain PMD for f/u for good continuity of care and further evaluation of her chronic/recurrent symptoms. Dx and testing d/w pt.  Questions answered.  Verb understanding, agreeable to d/c home with outpt f/u.     Final Clinical Impressions(s) / ED Diagnoses   Final diagnoses:  None    ED Discharge Orders    None       Samuel Jester, DO 05/12/18 1541

## 2018-05-10 NOTE — Discharge Instructions (Addendum)
Take your usual prescriptions as previously directed.  Keep yourself hydrated. Move slowly when changing positions. Call your regular medical doctor today to schedule a follow up appointment within the next week.  Return to the Emergency Department immediately sooner if worsening.

## 2018-05-10 NOTE — ED Triage Notes (Signed)
Per EMS, patient is complaining of dizziness when standing. Denies chest pain, visual changes, or headaches.   LMP was 10 days ago and patient has had tubes tied.   Not currently taking any medications or being treated for any medical conditions.   EMS completed orthostatic vitals BP Sitting: 106/78 BP Standing: 110/80

## 2018-06-17 ENCOUNTER — Encounter (HOSPITAL_COMMUNITY): Payer: Self-pay

## 2018-06-17 ENCOUNTER — Other Ambulatory Visit: Payer: Self-pay

## 2018-06-17 ENCOUNTER — Emergency Department (HOSPITAL_COMMUNITY): Payer: Medicaid Other

## 2018-06-17 ENCOUNTER — Emergency Department (HOSPITAL_COMMUNITY)
Admission: EM | Admit: 2018-06-17 | Discharge: 2018-06-17 | Disposition: A | Discharge status: Home / Self Care | Payer: Medicaid Other | Attending: Emergency Medicine | Admitting: Emergency Medicine

## 2018-06-17 DIAGNOSIS — Z79899 Other long term (current) drug therapy: Secondary | ICD-10-CM | POA: Insufficient documentation

## 2018-06-17 DIAGNOSIS — M25532 Pain in left wrist: Secondary | ICD-10-CM | POA: Diagnosis present

## 2018-06-17 DIAGNOSIS — Z87891 Personal history of nicotine dependence: Secondary | ICD-10-CM | POA: Diagnosis not present

## 2018-06-17 MED ORDER — PREDNISONE 50 MG PO TABS: 50.0000 mg | ORAL_TABLET | Freq: Every day | ORAL | 0 refills | Status: DC

## 2018-06-17 MED ORDER — TRAMADOL HCL 50 MG PO TABS: 50.0000 mg | ORAL_TABLET | Freq: Four times a day (QID) | ORAL | 0 refills | Status: DC | PRN

## 2018-06-17 NOTE — Discharge Instructions (Signed)
Return here as needed.  Ice and elevate the wrist. Follow up with the orthopedist provided.
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
## 2018-06-17 NOTE — ED Notes (Signed)
Pt is alert and oriented x 4 and is verbally responsive. Pt fnd guarding left wrist and states that pain began 3 days ago. No obvious deformities are noted. Pt reports 10/10 throbbing aching pain, pt reports that she applied ice but was not effective in pain management.

## 2018-06-17 NOTE — ED Triage Notes (Signed)
Patient c/o left wrist pain x 3 days. 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

## 2018-06-27 ENCOUNTER — Encounter (HOSPITAL_COMMUNITY): Payer: Self-pay | Admitting: Emergency Medicine

## 2018-06-27 ENCOUNTER — Emergency Department (HOSPITAL_COMMUNITY): Payer: Medicaid Other

## 2018-06-27 ENCOUNTER — Other Ambulatory Visit: Payer: Self-pay

## 2018-06-27 ENCOUNTER — Emergency Department (HOSPITAL_COMMUNITY)
Admission: EM | Admit: 2018-06-27 | Discharge: 2018-06-28 | Disposition: A | Discharge status: Home / Self Care | Payer: Medicaid Other | Attending: Emergency Medicine | Admitting: Emergency Medicine

## 2018-06-27 DIAGNOSIS — R51 Headache: Secondary | ICD-10-CM | POA: Insufficient documentation

## 2018-06-27 DIAGNOSIS — M542 Cervicalgia: Secondary | ICD-10-CM | POA: Insufficient documentation

## 2018-06-27 DIAGNOSIS — Z87891 Personal history of nicotine dependence: Secondary | ICD-10-CM | POA: Insufficient documentation

## 2018-06-27 DIAGNOSIS — M7918 Myalgia, other site: Secondary | ICD-10-CM

## 2018-06-27 DIAGNOSIS — M25552 Pain in left hip: Secondary | ICD-10-CM | POA: Insufficient documentation

## 2018-06-27 DIAGNOSIS — R0789 Other chest pain: Secondary | ICD-10-CM | POA: Insufficient documentation

## 2018-06-27 MED ORDER — METHOCARBAMOL 500 MG PO TABS: 500.0000 mg | ORAL_TABLET | Freq: Two times a day (BID) | ORAL | 0 refills | Status: DC

## 2018-06-27 MED ORDER — ACETAMINOPHEN 325 MG PO TABS
650.0000 mg | ORAL_TABLET | Freq: Once | ORAL | Status: DC
  Filled 2018-06-27: qty 2
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
## 2018-06-27 NOTE — ED Notes (Signed)
Pt. To x-ray .

## 2018-06-27 NOTE — Discharge Instructions (Addendum)
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
## 2018-09-11 ENCOUNTER — Encounter (HOSPITAL_COMMUNITY): Payer: Self-pay

## 2018-09-11 ENCOUNTER — Emergency Department (HOSPITAL_COMMUNITY)
Admission: EM | Admit: 2018-09-11 | Discharge: 2018-09-11 | Disposition: A | Discharge status: Home / Self Care | Payer: Medicaid Other | Attending: Emergency Medicine | Admitting: Emergency Medicine

## 2018-09-11 ENCOUNTER — Other Ambulatory Visit: Payer: Self-pay

## 2018-09-11 DIAGNOSIS — R55 Syncope and collapse: Secondary | ICD-10-CM | POA: Insufficient documentation

## 2018-09-11 DIAGNOSIS — Z87891 Personal history of nicotine dependence: Secondary | ICD-10-CM | POA: Insufficient documentation

## 2018-09-11 DIAGNOSIS — R42 Dizziness and giddiness: Secondary | ICD-10-CM | POA: Diagnosis present

## 2018-09-11 LAB — CBC WITH DIFFERENTIAL/PLATELET
ABS IMMATURE GRANULOCYTES: 0.03 10*3/uL (ref 0.00–0.07)
BASOS PCT: 0 %
Basophils Absolute: 0 10*3/uL (ref 0.0–0.1)
Eosinophils Absolute: 0.1 10*3/uL (ref 0.0–0.5)
Eosinophils Relative: 1 %
HCT: 38.6 % (ref 36.0–46.0)
HEMOGLOBIN: 12.3 g/dL (ref 12.0–15.0)
Immature Granulocytes: 0 %
LYMPHS PCT: 31 %
Lymphs Abs: 2.4 10*3/uL (ref 0.7–4.0)
MCH: 29.5 pg (ref 26.0–34.0)
MCHC: 31.9 g/dL (ref 30.0–36.0)
MCV: 92.6 fL (ref 80.0–100.0)
MONO ABS: 0.3 10*3/uL (ref 0.1–1.0)
Monocytes Relative: 4 %
NEUTROS ABS: 4.8 10*3/uL (ref 1.7–7.7)
Neutrophils Relative %: 64 %
Platelets: 422 10*3/uL — ABNORMAL HIGH (ref 150–400)
RBC: 4.17 MIL/uL (ref 3.87–5.11)
RDW: 13.2 % (ref 11.5–15.5)
WBC: 7.7 10*3/uL (ref 4.0–10.5)
nRBC: 0 % (ref 0.0–0.2)

## 2018-09-11 LAB — BASIC METABOLIC PANEL
Anion gap: 7 (ref 5–15)
BUN: 10 mg/dL (ref 6–20)
CO2: 25 mmol/L (ref 22–32)
Calcium: 9.3 mg/dL (ref 8.9–10.3)
Chloride: 106 mmol/L (ref 98–111)
Creatinine, Ser: 0.85 mg/dL (ref 0.44–1.00)
GFR calc Af Amer: >60 mL/min (ref >60)
Glucose, Bld: 83 mg/dL (ref 70–99)
POTASSIUM: 3.9 mmol/L (ref 3.5–5.1)
Sodium: 138 mmol/L (ref 135–145)

## 2018-09-11 LAB — CBG MONITORING, ED: GLUCOSE-CAPILLARY: 75 mg/dL (ref 70–99)

## 2018-09-11 LAB — URINALYSIS, ROUTINE W REFLEX MICROSCOPIC
Bilirubin Urine: NEGATIVE
Glucose, UA: NEGATIVE mg/dL
Hgb urine dipstick: NEGATIVE
Ketones, ur: NEGATIVE mg/dL
Leukocytes,Ua: NEGATIVE
Nitrite: NEGATIVE
Protein, ur: NEGATIVE mg/dL
SPECIFIC GRAVITY, URINE: 1.001 — AB (ref 1.005–1.030)
pH: 7 (ref 5.0–8.0)

## 2018-09-11 LAB — I-STAT BETA HCG BLOOD, ED (MC, WL, AP ONLY): I-stat hCG, quantitative: <5 m[IU]/mL (ref <5)

## 2018-09-11 LAB — TSH: TSH: 1.978 u[IU]/mL (ref 0.350–4.500)

## 2018-09-11 MED ORDER — SODIUM CHLORIDE 0.9 % IV BOLUS
1000.0000 mL | Freq: Once | INTRAVENOUS | Status: AC
  Administered 2018-09-11: 1000 mL via INTRAVENOUS

## 2018-09-11 NOTE — ED Triage Notes (Signed)
Pt BIB EMS from work. Pt reports episodes of dizziness upon standing since 2018. Pt reports heaving bleeding on menstrual cycle and just got off her cycle. Pt reports taking iron pills prescribed by PCP but has ran out of them. Pt has hx of anemia. Pt N/V/D.   111/63 100% RA CBG 92 RR 16

## 2018-09-11 NOTE — Discharge Instructions (Signed)
You were evaluated in the emergency department for an episode of feeling like you were going to pass out.

## 2018-09-11 NOTE — ED Notes (Signed)
Bed: OT15 Expected date:  Expected time:  Means of arrival:  Comments: EMS/Htn

## 2018-09-11 NOTE — ED Provider Notes (Signed)
[facility]-EMERGENCY DEPT Provider Note   CSN: [id number] Arrival date & time: 09/11/18  1155    History   Chief Complaint Chief Complaint  Patient presents with  . Dizziness    HPI Joann King is a 28 y.o. female.  She was brought in by EMS for evaluation of lightheadedness while at work today.  She said she gets this once every couple months and she comes to the hospital all the time for this.  Has been told various things, that she is anemic, that she has vertigo, that she is dehydrated.  She states she has heavy bleeding on her menstrual cycle and just finished her cycle last week.  She has had 5 days of flow.  She was taking iron supplements but ran out of them.  She does not have a PCP.  Denies any recent illness no fevers chills cough abdominal pain nausea vomiting diarrhea or urinary symptoms.     The history is provided by the patient.  Dizziness  Quality:  Lightheadedness Severity:  Severe Onset quality:  Sudden Timing:  Intermittent Progression:  Unchanged Chronicity:  Recurrent Context: physical activity   Relieved by:  Lying down Worsened by:  Standing up Ineffective treatments:  Change in position Associated symptoms: no blood in stool, no chest pain, no diarrhea, no headaches, no nausea, no shortness of breath, no syncope and no vomiting     Past Medical History:  Diagnosis Date  . Anemia    during first pregnancy  . Back pain   . Dizziness    recurrent  . Ectopic pregnancy, tubal   . Heartburn in pregnancy   . Herpes genitalia    doesn't think ever had outbreak  . Pregnancy induced hypertension     Patient Active Problem List   Diagnosis Date Noted  . Anemia 08/15/2016  . Gestational hypertension 07/20/2016  . Family history of mother as victim of domestic violence 07/20/2016  . Substance abuse (HCC) 07/20/2016  . History of cesarean delivery 10/07/2013  . Alcohol use complicating pregnancy in third trimester 10/07/2013     Past Surgical History:  Procedure Laterality Date  . CESAREAN SECTION  2009  . CESAREAN SECTION N/A 10/07/2013   Procedure: CESAREAN SECTION;  Surgeon: Lesly Dukes, MD;  Location: [facility];  Service: Obstetrics;  Laterality: N/A;  . CESAREAN SECTION WITH BILATERAL TUBAL LIGATION N/A 08/19/2016   Procedure: CESAREAN SECTION;  Surgeon: Catalina Antigua, MD;  Location: [facility];  Service: Obstetrics;  Laterality: N/A;  . DIAGNOSTIC LAPAROSCOPY WITH REMOVAL OF ECTOPIC PREGNANCY Left 08/02/2015   Procedure: DIAGNOSTIC LAPAROSCOPY WITH REMOVAL OF ECTOPIC PREGNANCY;  Surgeon: Catalina Antigua, MD;  Location: [facility];  Service: Gynecology;  Laterality: Left;  for ectopic   . TUBAL LIGATION Bilateral 08/19/2016   Procedure: BILATERAL TUBAL LIGATION;  Surgeon: Catalina Antigua, MD;  Location: [facility];  Service: Obstetrics;  Laterality: Bilateral;     OB History    Gravida  5   Para  3   Term  3   Preterm      AB  2   Living  3     SAB      TAB  1   Ectopic  1   Multiple  0   Live Births  3            Home Medications    Prior to Admission medications   Medication Sig Start Date End Date Taking? Authorizing Provider  lidocaine (  LIDODERM) 5 % Place 1 patch onto the skin daily. Remove & Discard patch within 12 hours or as directed by MD Patient not taking: Reported on 05/10/2018 03/21/18   Ward, Chase Picket, PA-C  methocarbamol (ROBAXIN) 500 MG tablet Take 1 tablet (500 mg total) by mouth 2 (two) times daily. 06/27/18   Couture, Cortni S, PA-C  naproxen (NAPROSYN) 500 MG tablet Take 1 tablet (500 mg total) by mouth every 12 (twelve) hours as needed for mild pain or moderate pain. 03/07/18   Antony Madura, PA-C  predniSONE (DELTASONE) 50 MG tablet Take 1 tablet (50 mg total) by mouth daily. 06/17/18   Lawyer, Cristal Deer, PA-C  traMADol (ULTRAM) 50 MG tablet Take 1 tablet (50 mg total) by mouth every 6 (six) hours as needed for severe pain. 06/17/18   Charlestine Night, PA-C    Family History Family History  Problem Relation Age of Onset  . Diabetes Maternal Grandmother   . Hypertension Maternal Grandmother     Social History Social History   Tobacco Use  . Smoking status: Former Smoker    Packs/day: 0.50    Types: Cigarettes  . Smokeless tobacco: Never Used  Substance Use Topics  . Alcohol use: No  . Drug use: No     Allergies   Patient has no known allergies.   Review of Systems Review of Systems  Constitutional: Negative for fever.  HENT: Negative for sore throat.   Eyes: Negative for visual disturbance.  Respiratory: Negative for shortness of breath.   Cardiovascular: Negative for chest pain and syncope.  Gastrointestinal: Negative for abdominal pain, blood in stool, diarrhea, nausea and vomiting.  Genitourinary: Negative for dysuria.  Musculoskeletal: Negative for neck pain.  Skin: Negative for rash.  Neurological: Positive for dizziness and light-headedness. Negative for headaches.     Physical Exam Updated Vital Signs BP 138/71 (BP Location: Left Arm)   Pulse 74   Temp 98.7 F (37.1 C) (Oral)   Resp 18   LMP 09/04/2018   SpO2 100%   Physical Exam Vitals signs and nursing note reviewed.  Constitutional:      General: She is not in acute distress.    Appearance: She is well-developed.  HENT:     Head: Normocephalic and atraumatic.  Eyes:     Conjunctiva/sclera: Conjunctivae normal.  Neck:     Musculoskeletal: Neck supple.  Cardiovascular:     Rate and Rhythm: Normal rate and regular rhythm.     Heart sounds: No murmur.  Pulmonary:     Effort: Pulmonary effort is normal. No respiratory distress.     Breath sounds: Normal breath sounds.  Abdominal:     Palpations: Abdomen is soft.     Tenderness: There is no abdominal tenderness.  Musculoskeletal: Normal range of motion.        General: No tenderness, deformity or signs of injury.  Skin:    General: Skin is warm and dry.  Neurological:      General: No focal deficit present.     Mental Status: She is alert and oriented to person, place, and time.     Sensory: No sensory deficit.     Motor: No weakness.     Gait: Gait normal.      ED Treatments / Results  Labs (all labs ordered are listed, but only abnormal results are displayed) Labs Reviewed  CBC WITH DIFFERENTIAL/PLATELET - Abnormal; Notable for the following components:      Result Value   Platelets 422 (*)  All other components within normal limits  URINALYSIS, ROUTINE W REFLEX MICROSCOPIC - Abnormal; Notable for the following components:   Color, Urine COLORLESS (*)    Specific Gravity, Urine 1.001 (*)    All other components within normal limits  BASIC METABOLIC PANEL  TSH  CBG MONITORING, ED  I-STAT BETA HCG BLOOD, ED ([facility], [facility], [facility] ONLY)    EKG EKG Interpretation  Date/Time:  Wednesday September 11 2018 14:06:29 EDT Ventricular Rate:  72 PR Interval:    QRS Duration: 88 QT Interval:  372 QTC Calculation: 408 R Axis:   85 Text Interpretation:  Sinus rhythm Baseline wander in lead(s) V3 no change from prior 11/19 Confirmed by Meridee Score [phone]) on 09/11/2018 2:11:47 PM   Radiology No results found.  Procedures Procedures (including critical care time)  Medications Ordered in ED Medications  sodium chloride 0.9 % bolus 1,000 mL (0 mLs Intravenous Stopped 09/11/18 1512)     Initial Impression / Assessment and Plan / ED Course  I have reviewed the triage vital signs and the nursing notes.  Pertinent labs & imaging results that were available during my care of the patient were reviewed by me and considered in my medical decision making (see chart for details).  Clinical Course as of Sep 10 1798  Wed Sep 11, 2018  1435 Patient with multiple episodes of dizziness lightheadedness over the course of months.  Her lab work here along with her exam and vitals have been fairly unremarkable.  We will have her follow-up with primary care for further  evaluation.   [MB]    Clinical Course User Index [MB] Terrilee Files, MD        Final Clinical Impressions(s) / ED Diagnoses   Final diagnoses:  Near syncope    ED Discharge Orders    None       Terrilee Files, MD 09/11/18 1800

## 2018-10-10 ENCOUNTER — Other Ambulatory Visit: Payer: Self-pay

## 2018-10-10 ENCOUNTER — Ambulatory Visit (HOSPITAL_COMMUNITY)
Admission: EM | Admit: 2018-10-10 | Discharge: 2018-10-10 | Disposition: A | Discharge status: Home / Self Care | Payer: Medicaid Other | Attending: Family Medicine | Admitting: Family Medicine

## 2018-10-10 ENCOUNTER — Encounter (HOSPITAL_COMMUNITY): Payer: Self-pay | Admitting: *Deleted

## 2018-10-10 DIAGNOSIS — N760 Acute vaginitis: Secondary | ICD-10-CM | POA: Insufficient documentation

## 2018-10-10 DIAGNOSIS — R55 Syncope and collapse: Secondary | ICD-10-CM | POA: Insufficient documentation

## 2018-10-10 LAB — POCT URINALYSIS DIP (DEVICE)
Bilirubin Urine: NEGATIVE
Glucose, UA: NEGATIVE mg/dL
Hgb urine dipstick: NEGATIVE
Ketones, ur: NEGATIVE mg/dL
Nitrite: NEGATIVE
Protein, ur: NEGATIVE mg/dL
Specific Gravity, Urine: 1.02 (ref 1.005–1.030)
Urobilinogen, UA: 0.2 mg/dL (ref 0.0–1.0)
pH: 6.5 (ref 5.0–8.0)

## 2018-10-10 MED ORDER — FLUCONAZOLE 150 MG PO TABS: 150.0000 mg | ORAL_TABLET | Freq: Every day | ORAL | 0 refills | Status: DC

## 2018-10-10 MED ORDER — METRONIDAZOLE 500 MG PO TABS: 500.0000 mg | ORAL_TABLET | Freq: Two times a day (BID) | ORAL | 0 refills | Status: DC

## 2018-10-10 NOTE — ED Provider Notes (Signed)
[facility]    CSN: [id number] Arrival date & time: 10/10/18  3570     History   Chief Complaint Chief Complaint  Patient presents with  . Dizziness  . Vaginal Discharge    HPI Joann King is a 28 y.o. female.   HPI  The patient is here for 2 complaints. King, she states that she has had "dizziness" off and on for many months.  She is had a couple evaluations for this.  She was seen at the emergency room not quite 4 weeks ago for the same complaint.  Diagnosed with presyncope.  She states that she will stand up and start to walk, feel hot, see spots in front of her eyes, the need to sit down and rest.  It is unrelated to meals.  Think she has hypoglycemia.  Has never had elevated blood pressure.  Is not on any chronic medications.  Does not smoke cigarettes or drink alcohol.  She had a work-up in the emergency room last month including EKG and lab work, basic metabolic panel, CBC, thyroid studies, negative pregnancy test.  She was told to follow-up with primary care.  Patient states that she was unaware that she was asked to follow-up.  I did explain to her that there is only some which we can do in an urgent care or emergency setting, that she needs follow-up testing arranged through a primary care physician such as Holter monitor or additional orthostatic blood pressure testing She states that she is under stress, but no more than usual.  She also states she has vaginitis.  Mild vaginal itching.  No abdominal pain.  No fever chills.  She has had unprotected sexual relations.  She would like to have STD testing.  She declines additional pregnancy testing, she has had her tubes tied.  Past Medical History:  Diagnosis Date  . Anemia    during King pregnancy  . Back pain   . Dizziness    recurrent  . Ectopic pregnancy, tubal   . Heartburn in pregnancy   . Herpes genitalia    doesn't think ever had outbreak  . Pregnancy induced hypertension     Patient Active  Problem List   Diagnosis Date Noted  . Anemia 08/15/2016  . Gestational hypertension 07/20/2016  . Family history of mother as victim of domestic violence 07/20/2016  . Substance abuse (HCC) 07/20/2016  . History of cesarean delivery 10/07/2013  . Alcohol use complicating pregnancy in third trimester 10/07/2013    Past Surgical History:  Procedure Laterality Date  . CESAREAN SECTION  2009  . CESAREAN SECTION N/A 10/07/2013   Procedure: CESAREAN SECTION;  Surgeon: Lesly Dukes, MD;  Location: [facility];  Service: Obstetrics;  Laterality: N/A;  . CESAREAN SECTION WITH BILATERAL TUBAL LIGATION N/A 08/19/2016   Procedure: CESAREAN SECTION;  Surgeon: Catalina Antigua, MD;  Location: [facility];  Service: Obstetrics;  Laterality: N/A;  . DIAGNOSTIC LAPAROSCOPY WITH REMOVAL OF ECTOPIC PREGNANCY Left 08/02/2015   Procedure: DIAGNOSTIC LAPAROSCOPY WITH REMOVAL OF ECTOPIC PREGNANCY;  Surgeon: Catalina Antigua, MD;  Location: [facility];  Service: Gynecology;  Laterality: Left;  for ectopic   . TUBAL LIGATION Bilateral 08/19/2016   Procedure: BILATERAL TUBAL LIGATION;  Surgeon: Catalina Antigua, MD;  Location: [facility];  Service: Obstetrics;  Laterality: Bilateral;    OB History    Gravida  5   Para  3   Term  3   Preterm      AB  2   Living  3     SAB      TAB  1   Ectopic  1   Multiple  0   Live Births  3            Home Medications    Prior to Admission medications   Medication Sig Start Date End Date Taking? Authorizing Provider  fluconazole (DIFLUCAN) 150 MG tablet Take 1 tablet (150 mg total) by mouth daily. Repeat in 1 week if needed 10/10/18   Eustace MooreNelson, Enzio Buchler Sue, MD  metroNIDAZOLE (FLAGYL) 500 MG tablet Take 1 tablet (500 mg total) by mouth 2 (two) times daily. 10/10/18   Eustace MooreNelson, Aneshia Jacquet Sue, MD    Family History Family History  Problem Relation Age of Onset  . Diabetes Maternal Grandmother   . Hypertension Maternal Grandmother     Social History Social  History   Tobacco Use  . Smoking status: Former Smoker    Packs/day: 0.50    Types: Cigarettes  . Smokeless tobacco: Never Used  Substance Use Topics  . Alcohol use: No  . Drug use: No     Allergies   Patient has no known allergies.   Review of Systems Review of Systems  Constitutional: Positive for diaphoresis. Negative for chills and fever.  HENT: Negative for ear pain and sore throat.   Eyes: Positive for visual disturbance. Negative for pain.  Respiratory: Negative for cough and shortness of breath.   Cardiovascular: Negative for chest pain and palpitations.  Gastrointestinal: Negative for abdominal pain and vomiting.  Genitourinary: Positive for vaginal discharge. Negative for dysuria and hematuria.  Musculoskeletal: Negative for arthralgias and back pain.  Skin: Negative for color change and rash.  Neurological: Positive for light-headedness. Negative for seizures and syncope.  All other systems reviewed and are negative.    Physical Exam Triage Vital Signs ED Triage Vitals  Enc Vitals Group     BP 10/10/18 0837 122/71     Pulse Rate 10/10/18 0837 82     Resp 10/10/18 0837 17     Temp 10/10/18 0837 98.5 F (36.9 C)     Temp Source 10/10/18 0837 Oral     SpO2 10/10/18 0837 96 %   No data found.  Updated Vital Signs BP 122/71 (BP Location: Right Arm)   Pulse 82   Temp 98.5 F (36.9 C) (Oral)   Resp 17   LMP 08/31/2018   SpO2 96% Comment: acrylic nails    Physical Exam Constitutional:      General: She is not in acute distress.    Appearance: Normal appearance. She is well-developed. She is obese.  HENT:     Head: Normocephalic and atraumatic.     Right Ear: Tympanic membrane and ear canal normal.     Left Ear: Tympanic membrane and ear canal normal.     Nose: Nose normal.     Mouth/Throat:     Mouth: Mucous membranes are moist.     Pharynx: No posterior oropharyngeal erythema.  Eyes:     Conjunctiva/sclera: Conjunctivae normal.     Pupils:  Pupils are equal, round, and reactive to light.  Neck:     Musculoskeletal: Normal range of motion.     Vascular: No carotid bruit.  Cardiovascular:     Rate and Rhythm: Normal rate and regular rhythm.     Heart sounds: Normal heart sounds.  Pulmonary:     Effort: Pulmonary effort is normal. No respiratory distress.  Breath sounds: Normal breath sounds.  Abdominal:     General: There is no distension.     Palpations: Abdomen is soft.  Musculoskeletal: Normal range of motion.        General: No deformity or signs of injury.     Right lower leg: No edema.     Left lower leg: No edema.  Lymphadenopathy:     Cervical: No cervical adenopathy.  Skin:    General: Skin is warm and dry.  Neurological:     General: No focal deficit present.     Mental Status: She is alert.     Gait: Gait normal.     Deep Tendon Reflexes: Reflexes normal.  Psychiatric:        Mood and Affect: Mood normal.        Behavior: Behavior normal.      UC Treatments / Results  Labs (all labs ordered are listed, but only abnormal results are displayed) Labs Reviewed  POCT URINALYSIS DIP (DEVICE) - Abnormal; Notable for the following components:      Result Value   Leukocytes,Ua SMALL (*)    All other components within normal limits  CERVICOVAGINAL ANCILLARY ONLY    EKG None  Radiology No results found.  Procedures Procedures (including critical care time)  Medications Ordered in UC Medications - No data to display  Initial Impression / Assessment and Plan / UC Course  I have reviewed the triage vital signs and the nursing notes.  Pertinent labs & imaging results that were available during my care of the patient were reviewed by me and considered in my medical decision making (see chart for details).     Referral to primary care doctor is emphasized to patient Vaginitis is treated.  Patient endorses need for STD testing Final Clinical Impressions(s) / UC Diagnoses   Final diagnoses:   Near syncope  Acute vaginitis     Discharge Instructions     Call and schedule follow-up with a primary care physician regarding your dizziness Your urine test is negative.  I do not see any sign of urinary tract infection or dehydration. We will send a swab to test for sexually transmitted diseases, chlamydia, trichomonas, gonorrhea I am treating you for vaginitis with metronidazole and Diflucan.  This will treat both BV and yeast infections You need to drink lots of water    ED Prescriptions    Medication Sig Dispense Auth. Provider   metroNIDAZOLE (FLAGYL) 500 MG tablet Take 1 tablet (500 mg total) by mouth 2 (two) times daily. 14 tablet Eustace Moore, MD   fluconazole (DIFLUCAN) 150 MG tablet Take 1 tablet (150 mg total) by mouth daily. Repeat in 1 week if needed 2 tablet Eustace Moore, MD     Controlled Substance Prescriptions [place] Controlled Substance Registry consulted? Not Applicable   Eustace Moore, MD 10/10/18 1243

## 2018-10-10 NOTE — Discharge Instructions (Addendum)
Call and schedule follow-up with a primary care physician regarding your dizziness Your urine test is negative.  I do not see any sign of urinary tract infection or dehydration. We will send a swab to test for sexually transmitted diseases, chlamydia, trichomonas, gonorrhea I am treating you for vaginitis with metronidazole and Diflucan.  This will treat both BV and yeast infections You need to drink lots of water

## 2018-10-10 NOTE — ED Triage Notes (Addendum)
Patient reports dizziness and lightheadedness after her period and after eating salty foods. Patient said she passed out a couple weeks ago because of it and was told that it was vertigo, was not given any medication. Patient states this has been going to on for unknown time. Reports headaches when she feels the other symptoms. States that she was been told that she is borderline hypertensive. Patient reports heavy periods.   Patient also reports vaginal discharge times one week. States mild itching.

## 2018-10-14 ENCOUNTER — Ambulatory Visit (INDEPENDENT_AMBULATORY_CARE_PROVIDER_SITE_OTHER): Payer: Self-pay | Admitting: Nurse Practitioner

## 2018-10-14 ENCOUNTER — Encounter: Payer: Self-pay | Admitting: Nurse Practitioner

## 2018-10-14 ENCOUNTER — Other Ambulatory Visit: Payer: Self-pay

## 2018-10-14 DIAGNOSIS — R55 Syncope and collapse: Secondary | ICD-10-CM

## 2018-10-14 LAB — CERVICOVAGINAL ANCILLARY ONLY
Chlamydia: NEGATIVE
Neisseria Gonorrhea: NEGATIVE
Trichomonas: POSITIVE — AB

## 2018-10-14 NOTE — Progress Notes (Signed)
Patient wants to be worked up for vertigo treatment. Has gone to ED a few times for dizziness & was told that it was vertigo but hasn't been placed on any treatment.

## 2018-10-14 NOTE — Progress Notes (Signed)
Assessment & Plan:  Joann King was seen today for establish care.  Diagnoses and all orders for this visit:  Near syncope -     Ambulatory referral to Cardiology Patient has been advised to apply for financial assistance and schedule to see our financial counselor.   Patient has been counseled on age-appropriate routine health concerns for screening and prevention. These are reviewed and up-to-date. Referrals have been placed accordingly. Immunizations are up-to-date or declined.    Subjective:   Chief Complaint  Patient presents with  . Establish Care   HPI Joann King 28 y.o. female presents for telehealth visit and to establish care.  She has multiple ED and urgent care visits for dizziness and near syncope as well as lightheadedness, dizziness and tinnitus. She states her tinnitus resolved after she was evaluated by ENT 6 months ago.  MRI at that time did show extensive chronic sinusitis with mucosal thickening/retention cysts affecting the LEFT greater than RIGHT maxillary, RIGHT greater than LEFT sphenoid, BILATERAL ethmoid, and RIGHT greater than LEFT frontal sinuses. Negative appearing orbits.  Syncope: Patient complains of near syncope. Onset was 2 years ago, with unchanged course since that time. Patient describes the episode as never actually lost consciousness, had precursor symptoms only, including feeling of almost losing consciousness. Patient also has associated symptoms of  visual aura "sees  Black dots and can hear her heart beat in her ears".  The patient denies headache, nausea and tachycardia/palpitations. Taking culprit meds?: none. States symptoms occur about once a month and tend to occur while standing or after she eats salty foods or if she doesn't eat at all. Glucose has been normal. Last occurrence on 10-10-2018 and she was evaluated in the ED. EKG normal.   Review of Systems  Constitutional: Negative for fever, malaise/fatigue and weight loss.  HENT:  Negative.  Negative for nosebleeds.   Eyes: Negative.  Negative for blurred vision, double vision and photophobia.  Respiratory: Negative.  Negative for cough and shortness of breath.   Cardiovascular: Negative for chest pain, palpitations and leg swelling.       SEE HPI  Gastrointestinal: Negative.  Negative for heartburn, nausea and vomiting.  Musculoskeletal: Negative.  Negative for myalgias.  Neurological: Negative for dizziness, focal weakness, seizures and headaches.       SEE HPI  Psychiatric/Behavioral: Negative.  Negative for suicidal ideas.    Past Medical History:  Diagnosis Date  . Anemia    during first pregnancy  . Back pain   . Dizziness    recurrent  . Ectopic pregnancy, tubal   . Heartburn in pregnancy   . Herpes genitalia    doesn't think ever had outbreak  . Pregnancy induced hypertension     Past Surgical History:  Procedure Laterality Date  . CESAREAN SECTION  2009  . CESAREAN SECTION N/A 10/07/2013   Procedure: CESAREAN SECTION;  Surgeon: Lesly Dukes, MD;  Location: [facility];  Service: Obstetrics;  Laterality: N/A;  . CESAREAN SECTION WITH BILATERAL TUBAL LIGATION N/A 08/19/2016   Procedure: CESAREAN SECTION;  Surgeon: Catalina Antigua, MD;  Location: [facility];  Service: Obstetrics;  Laterality: N/A;  . DIAGNOSTIC LAPAROSCOPY WITH REMOVAL OF ECTOPIC PREGNANCY Left 08/02/2015   Procedure: DIAGNOSTIC LAPAROSCOPY WITH REMOVAL OF ECTOPIC PREGNANCY;  Surgeon: Catalina Antigua, MD;  Location: [facility];  Service: Gynecology;  Laterality: Left;  for ectopic   . TUBAL LIGATION Bilateral 08/19/2016   Procedure: BILATERAL TUBAL LIGATION;  Surgeon: Catalina Antigua, MD;  Location: [facility];  Service: Obstetrics;  Laterality: Bilateral;    Family History  Problem Relation Age of Onset  . Diabetes Maternal Grandmother   . Hypertension Maternal Grandmother     Social History Reviewed with no changes to be made today.   Outpatient Medications Prior to  Visit  Medication Sig Dispense Refill  . fluconazole (DIFLUCAN) 150 MG tablet Take 1 tablet (150 mg total) by mouth daily. Repeat in 1 week if needed (Patient not taking: Reported on 10/14/2018) 2 tablet 0  . metroNIDAZOLE (FLAGYL) 500 MG tablet Take 1 tablet (500 mg total) by mouth 2 (two) times daily. (Patient not taking: Reported on 10/14/2018) 14 tablet 0   No facility-administered medications prior to visit.     No Known Allergies     Objective:    LMP 10/11/2018  Wt Readings from Last 3 Encounters:  06/17/18 210 lb (95.3 kg)  05/10/18 246 lb (111.6 kg)  03/06/18 242 lb (109.8 kg)       Patient has been counseled extensively about nutrition and exercise as well as the importance of adherence with medications and regular follow-up. The patient was given clear instructions to go to ER or return to medical center if symptoms don't improve, worsen or new problems develop. The patient verbalized understanding.   Follow-up: Return in about 2 months (around 12/14/2018).   Claiborne RiggZelda W Shemia Bevel, FNP-BC [facility] [place], [place] [phone]   10/14/2018, 3:46 PM

## 2018-10-16 ENCOUNTER — Telehealth (HOSPITAL_COMMUNITY): Payer: Self-pay | Admitting: Emergency Medicine

## 2018-10-16 NOTE — Telephone Encounter (Signed)
Trichomonas is positive. Rx metronidazole was given at the urgent care visit. Pt needs education to please refrain from sexual intercourse for 7 days to give the medicine time to work. Sexual partners need to be notified and tested/treated. Condoms may reduce risk of reinfection. Recheck for further evaluation if symptoms are not improving.   Patient contacted and made aware of all results, all questions answered.   

## 2018-11-04 ENCOUNTER — Encounter: Payer: Self-pay | Admitting: Cardiology

## 2018-11-04 ENCOUNTER — Telehealth: Payer: Self-pay

## 2018-11-04 ENCOUNTER — Telehealth (INDEPENDENT_AMBULATORY_CARE_PROVIDER_SITE_OTHER): Payer: Medicaid Other | Admitting: Cardiology

## 2018-11-04 ENCOUNTER — Other Ambulatory Visit: Payer: Self-pay

## 2018-11-04 VITALS — HR 97 | Ht 66.0 in

## 2018-11-04 DIAGNOSIS — R002 Palpitations: Secondary | ICD-10-CM

## 2018-11-04 DIAGNOSIS — R55 Syncope and collapse: Secondary | ICD-10-CM

## 2018-11-04 DIAGNOSIS — R42 Dizziness and giddiness: Secondary | ICD-10-CM | POA: Insufficient documentation

## 2018-11-04 DIAGNOSIS — O139 Gestational [pregnancy-induced] hypertension without significant proteinuria, unspecified trimester: Secondary | ICD-10-CM

## 2018-11-04 DIAGNOSIS — Z7189 Other specified counseling: Secondary | ICD-10-CM

## 2018-11-04 HISTORY — DX: Dizziness and giddiness: R55

## 2018-11-04 HISTORY — DX: Palpitations: R00.2

## 2018-11-04 HISTORY — DX: Dizziness and giddiness: R42

## 2018-11-04 NOTE — Patient Instructions (Signed)
Medication Instructions:  Your provider recommends that you continue on your current medications as directed. Please refer to the Current Medication list given to you today.    Testing/Procedures: Your provider has requested that you have an echocardiogram. You will be called to arrange this test. Echocardiography is a painless test that uses sound waves to create images of your heart. It provides your doctor with information about the size and shape of your heart and how well your heart's chambers and valves are working. This procedure takes approximately one hour. There are no restrictions for this procedure.    Dr. Mayford Knife recommends you wear a HEART MONITOR for 2 weeks. The monitor technician will be in touch and will mail the monitor to your home.  Follow-Up: Dr. Norris Cross nurse will contact you to schedule a follow-up appointment with Dr. Mayford Knife in about 6 weeks.

## 2018-11-04 NOTE — Progress Notes (Signed)
Virtual Visit via Video Note   This visit type was conducted due to national recommendations for restrictions regarding the COVID-19 Pandemic (e.g. social distancing) in an effort to limit this patient's exposure and mitigate transmission in our community.  Due to her co-morbid illnesses, this patient is at least at moderate risk for complications without adequate follow up.  This format is felt to be most appropriate for this patient at this time.  All issues noted in this document were discussed and addressed.  A limited physical exam was performed with this format.  Please refer to the patient's chart for her consent to telehealth for [facility].  Evaluation Performed:  Follow-up visit  This visit type was conducted due to national recommendations for restrictions regarding the COVID-19 Pandemic (e.g. social distancing).  This format is felt to be most appropriate for this patient at this time.  All issues noted in this document were discussed and addressed.  No physical exam was performed (except for noted visual exam findings with Video Visits).  Please refer to the patient's chart (MyChart message for video visits and phone note for telephone visits) for the patient's consent to telehealth for [facility].  Date:  11/04/2018   ID:  Joann GammonDiesha M Strozier, DOB Sep 10, 1990, MRN [id number]  Patient Location:  Home  [place] location[place]   [place]  PCP:  Bing NeighborsHarris, Kimberly S, FNP  Cardiologist:  NEW Electrophysiologist:  None   Chief Complaint:  Presyncope  History of Present Illness:    Joann King is a 28 y.o. female who presents via audio/video conferencing for a telehealth visit today in referral from Joaquin CourtsKimberly Harris, FNP for evaluation of sresncope.    This is a pleasant 27yo AAF with a hx of GERD and HTN during pregnancy who recently was seen in the ER for dizziness.  She apparently has had dizziness off and on for several months and has been in the ER several times for  evaluation.  She was seen in the ER in March and then again April 9 with dizzy spells and was diagnosed with presyncope.  She told them that whenever she go to stand up and starts to walk she is all of a sudden becomes hot and see spots in front of her eyes and has to sit down and rest.  There is no relation to meals although she was worried it was due to low blood sugar.  Labs in March were completely normal.  EKG showed normal sinus rhythm with normal intervals.    She is now here for evaluation.  She does denies any family history of sudden cardiac death.  She has not had any frank syncopal episodes.  She is estranged from her father and her mother died when the patient was [age] of an overdose.  She has 2 older sisters and a brother.  1 of her sisters has had multiple miscarriages.  She says that her symptoms of dizziness have occurred since she was pregnant thousand 17 with her son.  She says she had borderline hypertension while she was pregnant.  After that she started having issues with dizzy spells after she would be sitting for a period of time and go to get up.  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  She says she will be sitting a lot and then have to get up and check a truck and ask when she starts to feel dizzy and has to catch herself.  She has never had any  syncope.  She denies any diaphoresis with the events but does occasionally get nauseated and then she will also feel her heart racing.  She says it feels like a rush going to her head.  She also says that once in a while although pretty rare she will feel pins-and-needles in her chest.  She denies any shortness of breath, dyspnea on exertion, PND, orthopnea or lower extremity edema.  He denies any tobacco or alcohol use.   The patient does not have symptoms concerning for COVID-19 infection (fever, chills, cough, or new shortness of breath).    Prior CV studies:   The following studies were reviewed today:  EKG  Past Medical  History:  Diagnosis Date   Anemia    during first pregnancy   Back pain    Dizziness    recurrent   Ectopic pregnancy, tubal    Heartburn in pregnancy    Herpes genitalia    doesn't think ever had outbreak   Pregnancy induced hypertension    Past Surgical History:  Procedure Laterality Date   CESAREAN SECTION  2009   CESAREAN SECTION N/A 10/07/2013   Procedure: CESAREAN SECTION;  Surgeon: Lesly Dukes, MD;  Location: [facility];  Service: Obstetrics;  Laterality: N/A;   CESAREAN SECTION WITH BILATERAL TUBAL LIGATION N/A 08/19/2016   Procedure: CESAREAN SECTION;  Surgeon: Catalina Antigua, MD;  [facility]: [facility];  Service: Obstetrics;  Laterality: N/A;   DIAGNOSTIC LAPAROSCOPY WITH REMOVAL OF ECTOPIC PREGNANCY Left 08/02/2015   Procedure: DIAGNOSTIC LAPAROSCOPY WITH REMOVAL OF ECTOPIC PREGNANCY;  Surgeon: Catalina Antigua, MD;  Location: [facility];  Service: Gynecology;  Laterality: Left;  for ectopic    TUBAL LIGATION Bilateral 08/19/2016   Procedure: BILATERAL TUBAL LIGATION;  Surgeon: Catalina Antigua, MD;  Location: [facility];  Service: Obstetrics;  Laterality: Bilateral;     No outpatient medications have been marked as taking for the 11/04/18 encounter (Telemedicine) with Quintella Reichert, MD.     Allergies:   Patient has no known allergies.   Social History   Tobacco Use   Smoking status: Former Smoker    Packs/day: 0.50    Types: Cigarettes   Smokeless tobacco: Never Used  Substance Use Topics   Alcohol use: No   Drug use: No     Family Hx: The patient's family history includes Diabetes in her maternal grandmother; Hypertension in her maternal grandmother.  ROS:   Please see the history of present illness.     All other systems reviewed and are negative.   Labs/Other Tests and Data Reviewed:    Recent Labs: 05/10/2018: ALT 22 09/11/2018: BUN 10; Creatinine, Ser 0.85; Hemoglobin 12.3; Platelets 422; Potassium 3.9; Sodium 138; TSH 1.978    Recent Lipid Panel No results found for: CHOL, TRIG, HDL, CHOLHDL, LDLCALC, LDLDIRECT  Wt Readings from Last 3 Encounters:  06/17/18 210 lb (95.3 kg)  05/10/18 246 lb (111.6 kg)  03/06/18 242 lb (109.8 kg)     Objective:    Vital Signs:  Pulse 97    Ht 5\' 6"  (1.676 m)    LMP 10/11/2018    BMI 33.89 kg/m    CONSTITUTIONAL:  Well nourished, well developed female in no acute distress.  EYES: anicteric MOUTH: oral mucosa is pink RESPIRATORY: Normal respiratory effort, symmetric expansion CARDIOVASCULAR: No peripheral edema SKIN: No rash, lesions or ulcers MUSCULOSKELETAL: no digital cyanosis NEURO: Cranial Nerves II-XII grossly intact, moves all extremities PSYCH: Intact judgement and insight.  A&O x 3, Mood/affect  appropriate   ASSESSMENT & PLAN:    1.  Presyncope - her symptoms sound most related to orthostatic hypotension.  Postural orthostatic tachycardia is not a possibility.  She says she drinks 10 bottles of water a day and knows she  stays well-hydrated.  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  I am going to get an event monitor to make sure she is not having any arrhythmias.  I will order a long-term ZIO patch.  I will also assess a 2D echocardiogram to rule out structural heart disease.  2.  Palpitations -unclear whether these are related to rebound tachycardia in the setting of orthostatic hypotension, POTS or anxiety.  She gets very anxious and starts to shake whenever she gets these dizzy episodes.  Await ZIO patch results  3.  Borderline hypertension - this was mainly noticed when she was pregnant but her blood pressures been controlled since then and her BP at her last ER visit was 122/71 mmHg.  She is currently not on any medication for this.  4.  COVID-19 Education:The signs and symptoms of COVID-19 were discussed with the patient and how to seek care for  testing (follow up with PCP or arrange E-visit).  The importance of social distancing was discussed today.  Patient Risk:   After full review of this patient's clinical status, I feel that they are at least moderate risk at this time.  Time:   Today, I have spent 20 minutes directly with the patient on vidoe discussing medical problems including presyncope, palpitations and HTN.  We also reviewed the symptoms of COVID 19 and the ways to protect against contracting the virus with telehealth technology.  I spent an additional 10 minutes reviewing patient's chart including EKG, multiple ER records.  Medication Adjustments/Labs and Tests Ordered: Current medicines are reviewed at length with the patient today.  Concerns regarding medicines are outlined above.  Tests Ordered: No orders of the defined types were placed in this encounter.  Medication Changes: No orders of the defined types were placed in this encounter.   Disposition:  Follow up 6 weeks  Signed, Armanda Magic, MD  11/04/2018 12:39 PM    [facility]

## 2018-11-04 NOTE — Addendum Note (Signed)
Addended by: Gunnar Fusi A on: 11/04/2018 02:16 PM   Modules accepted: Orders

## 2018-11-04 NOTE — Telephone Encounter (Signed)
Called pt to obtain consent for visit prior to getting vitals. Pt has given consent for today's video visit.    YOUR CARDIOLOGY TEAM HAS ARRANGED FOR AN E-VISIT FOR YOUR APPOINTMENT - PLEASE REVIEW IMPORTANT INFORMATION BELOW SEVERAL DAYS PRIOR TO YOUR APPOINTMENT  Due to the recent COVID-19 pandemic, we are transitioning in-person office visits to tele-medicine visits in an effort to decrease unnecessary exposure to our patients and staff. Medicare and most insurances are covering these visits without a copay needed. You will need a working email and a smartphone or computer with a camera and microphone. For patients that do not have these items, we can still complete the visit using a telephone but do prefer video when possible. If possible, we also ask that you have a blood pressure cuff and scale at home to measure your blood pressure, heart rate and weight prior to your scheduled appointment. Patients with clinical needs that need an in-person evaluation and testing will still be able to come to the office if absolutely necessary. If you have any questions, feel free to call our office.     DOWNLOADING THE SOFTWARE  Download the [employer] app to enable video and telephone visits with your [facility] Provider.   Instructions for downloading [employer] WebEx: - Go to https://www.webex.com/downloads.html and follow the instructions, or download the app on your smartphone [facility] [employer] Meetings). - If you have technical difficulties with downloading WebEx, please call WebEx at [phone]. - Once the app is downloaded (can be done on either mobile or desktop computer), go to Settings in the upper left hand corner.  Be sure that camera and audio are enabled.  - You will receive an email message with a link to the meeting with a time to join for your tele-health visit.  - Please download the app and have settings configured prior to the appointment time.      2-3 DAYS BEFORE YOUR  APPOINTMENT  One of our staff will call you to confirm that you have been able to set up your WebEx account. We will remind you check your blood pressure, heart rate and weight prior to your scheduled appointment. If you have an Apple Watch or Kardia, please upload any pertinent ECG strips the day before or morning of your appointment to MyChart. Our staff will also make sure you have reviewed the consent and agree to move forward with your scheduled tele-health visit.    THE DAY OF YOUR APPOINTMENT  Approximately 15-20 minutes prior to your scheduled appointment, you will receive an e-mail directly from one of our staff member's [email or web address] e-mail accounts inviting you to join a WebEx meeting.  Please do not reply to that email - simply join the [employer].  Upon joining, a member of the office staff will speak with you initially through the WebEx platform to confirm medications, vital signs for the day and any symptoms you may be experiencing.  Please have this information available prior to the time of visit start.      CONSENT FOR TELE-HEALTH VISIT - PLEASE RVIEW  I hereby voluntarily request, consent and authorize [facility] and its employed or contracted physicians, physician assistants, nurse practitioners or other licensed health care professionals (the Practitioner), to provide me with telemedicine health care services (the "Services") as deemed necessary by the treating Practitioner. I acknowledge and consent to receive the Services by the Practitioner via telemedicine. I understand that the telemedicine visit will involve communicating with the Practitioner through live audiovisual  Quarry managercommunication technology and the disclosure of certain medical information by electronic transmission. I acknowledge that I have been given the opportunity to request an in-person assessment or other available alternative prior to the telemedicine visit and am voluntarily participating in the  telemedicine visit.  I understand that I have the right to withhold or withdraw my consent to the use of telemedicine in the course of my care at any time, without affecting my right to future care or treatment, and that the Practitioner or I may terminate the telemedicine visit at any time. I understand that I have the right to inspect all information obtained and/or recorded in the course of the telemedicine visit and may receive copies of available information for a reasonable fee.  I understand that some of the potential risks of receiving the Services via telemedicine include:  Marland Kitchen. Delay or interruption in medical evaluation due to technological equipment failure or disruption; . Information transmitted may not be sufficient (e.g. poor resolution of images) to allow for appropriate medical decision making by the Practitioner; and/or  . In rare instances, security protocols could fail, causing a breach of personal health information.  Furthermore, I acknowledge that it is my responsibility to provide information about my medical history, conditions and care that is complete and accurate to the best of my ability. I acknowledge that Practitioner's advice, recommendations, and/or decision may be based on factors not within their control, such as incomplete or inaccurate data provided by me or distortions of diagnostic images or specimens that may result from electronic transmissions. I understand that the practice of medicine is not an exact science and that Practitioner makes no warranties or guarantees regarding treatment outcomes. I acknowledge that I will receive a copy of this consent concurrently upon execution via email to the email address I last provided but may also request a printed copy by calling the office of [facility].    I understand that my insurance will be billed for this visit.   I have read or had this consent read to me. . I understand the contents of this consent, which  adequately explains the benefits and risks of the Services being provided via telemedicine.  . I have been provided ample opportunity to ask questions regarding this consent and the Services and have had my questions answered to my satisfaction. . I give my informed consent for the services to be provided through the use of telemedicine in my medical care  By participating in this telemedicine visit I agree to the above.

## 2018-11-05 ENCOUNTER — Telehealth: Payer: Self-pay | Admitting: Radiology

## 2018-11-05 NOTE — Telephone Encounter (Signed)
Enrolled patient for a 14 day Zio Long Term Monitor to be mailed. Brief instructions were gone over with patient and she knows to expect the monitor in 3-4 days

## 2018-11-07 ENCOUNTER — Encounter (HOSPITAL_COMMUNITY): Payer: Self-pay

## 2018-11-07 ENCOUNTER — Other Ambulatory Visit: Payer: Self-pay

## 2018-11-07 ENCOUNTER — Emergency Department (HOSPITAL_COMMUNITY): Payer: Medicaid Other

## 2018-11-07 ENCOUNTER — Emergency Department (HOSPITAL_COMMUNITY)
Admission: EM | Admit: 2018-11-07 | Discharge: 2018-11-07 | Disposition: A | Discharge status: Home / Self Care | Payer: Medicaid Other | Attending: Emergency Medicine | Admitting: Emergency Medicine

## 2018-11-07 DIAGNOSIS — R002 Palpitations: Secondary | ICD-10-CM

## 2018-11-07 DIAGNOSIS — Z87891 Personal history of nicotine dependence: Secondary | ICD-10-CM | POA: Diagnosis not present

## 2018-11-07 DIAGNOSIS — R42 Dizziness and giddiness: Secondary | ICD-10-CM | POA: Diagnosis present

## 2018-11-07 LAB — CBC WITH DIFFERENTIAL/PLATELET
Abs Immature Granulocytes: 0.01 10*3/uL (ref 0.00–0.07)
Basophils Absolute: 0 10*3/uL (ref 0.0–0.1)
Basophils Relative: 0 %
Eosinophils Absolute: 0.1 10*3/uL (ref 0.0–0.5)
Eosinophils Relative: 1 %
HCT: 38.2 % (ref 36.0–46.0)
Hemoglobin: 12.4 g/dL (ref 12.0–15.0)
Immature Granulocytes: 0 %
Lymphocytes Relative: 31 %
Lymphs Abs: 2.4 10*3/uL (ref 0.7–4.0)
MCH: 29.2 pg (ref 26.0–34.0)
MCHC: 32.5 g/dL (ref 30.0–36.0)
MCV: 90.1 fL (ref 80.0–100.0)
Monocytes Absolute: 0.5 10*3/uL (ref 0.1–1.0)
Monocytes Relative: 6 %
Neutro Abs: 4.8 10*3/uL (ref 1.7–7.7)
Neutrophils Relative %: 62 %
Platelets: 340 10*3/uL (ref 150–400)
RBC: 4.24 MIL/uL (ref 3.87–5.11)
RDW: 13.1 % (ref 11.5–15.5)
WBC: 7.9 10*3/uL (ref 4.0–10.5)
nRBC: 0 % (ref 0.0–0.2)

## 2018-11-07 LAB — COMPREHENSIVE METABOLIC PANEL
ALT: 33 U/L (ref 0–44)
AST: 27 U/L (ref 15–41)
Albumin: 3.7 g/dL (ref 3.5–5.0)
Alkaline Phosphatase: 87 U/L (ref 38–126)
Anion gap: 10 (ref 5–15)
BUN: 11 mg/dL (ref 6–20)
CO2: 23 mmol/L (ref 22–32)
Calcium: 9.2 mg/dL (ref 8.9–10.3)
Chloride: 107 mmol/L (ref 98–111)
Creatinine, Ser: 1.08 mg/dL — ABNORMAL HIGH (ref 0.44–1.00)
GFR calc Af Amer: >60 mL/min (ref >60)
GFR calc non Af Amer: >60 mL/min (ref >60)
Glucose, Bld: 85 mg/dL (ref 70–99)
Potassium: 3.7 mmol/L (ref 3.5–5.1)
Sodium: 140 mmol/L (ref 135–145)
Total Bilirubin: 0.6 mg/dL (ref 0.3–1.2)
Total Protein: 7.3 g/dL (ref 6.5–8.1)

## 2018-11-07 LAB — TROPONIN I: Troponin I: <0.03 ng/mL (ref <0.03)

## 2018-11-07 LAB — TSH: TSH: 1.826 u[IU]/mL (ref 0.350–4.500)

## 2018-11-07 LAB — I-STAT BETA HCG BLOOD, ED (MC, WL, AP ONLY): I-stat hCG, quantitative: <5 m[IU]/mL (ref <5)

## 2018-11-07 MED ORDER — SODIUM CHLORIDE 0.9 % IV BOLUS
1000.0000 mL | Freq: Once | INTRAVENOUS | Status: AC
  Administered 2018-11-07: 1000 mL via INTRAVENOUS

## 2018-11-07 NOTE — ED Provider Notes (Signed)
[facility] EMERGENCY DEPARTMENT Provider Note   CSN: [id number] Arrival date & time: 11/07/18  2006    History   Chief Complaint Chief Complaint  Patient presents with  . Dizziness    HPI KHYA HALLS is a 28 y.o. female history of anemia, recurrent dizziness here presenting with dizzy episode.  Patient states that she has occasional episodes of dizziness for the last several months.  She was seen previously for similar symptoms and eventually saw cardiology and was ordered for a Holter monitor.  Patient states that she got upset earlier today and went to the store and felt like her heart was racing for about 30 minutes.  Also feels lightheaded and dizzy but denies any room spinning or falling.  Denies any chest pain or shortness of breath.     The history is provided by the patient.    Past Medical History:  Diagnosis Date  . Anemia    during first pregnancy  . Back pain   . Dizziness    recurrent  . Ectopic pregnancy, tubal   . Heartburn in pregnancy   . Herpes genitalia    doesn't think ever had outbreak  . Pregnancy induced hypertension     Patient Active Problem List   Diagnosis Date Noted  . Postural dizziness with presyncope 11/04/2018  . Palpitations 11/04/2018  . Anemia 08/15/2016  . Gestational hypertension 07/20/2016  . Family history of mother as victim of domestic violence 07/20/2016  . Substance abuse (HCC) 07/20/2016  . History of cesarean delivery 10/07/2013  . Alcohol use complicating pregnancy in third trimester 10/07/2013    Past Surgical History:  Procedure Laterality Date  . CESAREAN SECTION  2009  . CESAREAN SECTION N/A 10/07/2013   Procedure: CESAREAN SECTION;  Surgeon: Lesly Dukes, MD;  Location: [facility];  Service: Obstetrics;  Laterality: N/A;  . CESAREAN SECTION WITH BILATERAL TUBAL LIGATION N/A 08/19/2016   Procedure: CESAREAN SECTION;  Surgeon: Catalina Antigua, MD;  [facility];  Service:  Obstetrics;  Laterality: N/A;  . DIAGNOSTIC LAPAROSCOPY WITH REMOVAL OF ECTOPIC PREGNANCY Left 08/02/2015   Procedure: DIAGNOSTIC LAPAROSCOPY WITH REMOVAL OF ECTOPIC PREGNANCY;  Surgeon: Catalina Antigua, MD;  Location: [facility];  Service: Gynecology;  Laterality: Left;  for ectopic   . TUBAL LIGATION Bilateral 08/19/2016   Procedure: BILATERAL TUBAL LIGATION;  Surgeon: Catalina Antigua, MD;  [facility];  Service: Obstetrics;  Laterality: Bilateral;     OB History    Gravida  5   Para  3   Term  3   Preterm      AB  2   Living  3     SAB      TAB  1   Ectopic  1   Multiple  0   Live Births  3            Home Medications    Prior to Admission medications   Not on File    Family History Family History  Problem Relation Age of Onset  . Diabetes Maternal Grandmother   . Hypertension Maternal Grandmother     Social History Social History   Tobacco Use  . Smoking status: Former Smoker    Packs/day: 0.50    Types: Cigarettes  . Smokeless tobacco: Never Used  Substance Use Topics  . Alcohol use: No  . Drug use: No     Allergies   Patient has no known allergies.   Review of  Systems Review of Systems  Neurological: Positive for dizziness.  All other systems reviewed and are negative.    Physical Exam Updated Vital Signs BP 133/60 (BP Location: Right Arm)   Pulse 87   Temp 98.4 F (36.9 C) (Oral)   Resp (!) 27   Ht 5\' 6"  (1.676 m)   Wt 99.8 kg   LMP 10/11/2018   SpO2 99%   BMI 35.51 kg/m   Physical Exam Vitals signs and nursing note reviewed.  Constitutional:      Appearance: Normal appearance.  HENT:     Head: Normocephalic.     Mouth/Throat:     Mouth: Mucous membranes are moist.  Eyes:     Extraocular Movements: Extraocular movements intact.     Pupils: Pupils are equal, round, and reactive to light.  Neck:     Musculoskeletal: Normal range of motion.  Cardiovascular:     Rate and Rhythm: Normal rate and regular  rhythm.     Pulses: Normal pulses.     Heart sounds: Normal heart sounds.  Pulmonary:     Effort: Pulmonary effort is normal.     Breath sounds: Normal breath sounds.  Abdominal:     General: Abdomen is flat.     Palpations: Abdomen is soft.  Musculoskeletal: Normal range of motion.        General: No swelling or tenderness.  Skin:    General: Skin is warm.     Capillary Refill: Capillary refill takes less than 2 seconds.  Neurological:     General: No focal deficit present.     Mental Status: She is alert and oriented to person, place, and time.  Psychiatric:        Mood and Affect: Mood normal.        Behavior: Behavior normal.      ED Treatments / Results  Labs (all labs ordered are listed, but only abnormal results are displayed) Labs Reviewed  COMPREHENSIVE METABOLIC PANEL - Abnormal; Notable for the following components:      Result Value   Creatinine, Ser 1.08 (*)    All other components within normal limits  CBC WITH DIFFERENTIAL/PLATELET  TROPONIN I  TSH  I-STAT BETA HCG BLOOD, ED ([facility], [facility], [facility] ONLY)    EKG EKG Interpretation  Date/Time:  Thursday Nov 07 2018 20:12:17 EDT Ventricular Rate:  89 PR Interval:    QRS Duration: 94 QT Interval:  338 QTC Calculation: 412 R Axis:   94 Text Interpretation:  Sinus rhythm Borderline right axis deviation No significant change since last tracing Confirmed by Richardean Canal [phone]) on 11/07/2018 8:55:42 PM   Radiology Dg Chest 2 View  Result Date: 11/07/2018 CLINICAL DATA:  Dizziness for 2 hours EXAM: CHEST - 2 VIEW COMPARISON:  06/27/2018 FINDINGS: The heart size and mediastinal contours are within normal limits. Both lungs are clear. The visualized skeletal structures are unremarkable. IMPRESSION: No active cardiopulmonary disease. Electronically Signed   By: Elige Ko   On: 11/07/2018 20:50    Procedures Procedures (including critical care time)  Medications Ordered in ED Medications  sodium chloride 0.9 %  bolus 1,000 mL (1,000 mLs Intravenous New Bag/Given 11/07/18 2025)     Initial Impression / Assessment and Plan / ED Course  I have reviewed the triage vital signs and the nursing notes.  Pertinent labs & imaging results that were available during my care of the patient were reviewed by me and considered in my medical decision making (see chart for  details).       Piedad ClimesDiesha Burnell BlanksM Abrigo is a 28 y.o. female here with dizziness. This is a recurrent problem and had multiple evaluations before.  She had an unremarkable CT head previously.  Today's episode happened after she had some arguments with family member.  I think it is likely stress-induced.  I also reviewed the cardiology notes recently, and Holter monitor was recommended but patient is likely having orthostatic changes.  We will check some lab work as well as TSH and get orthostatics.  If workup unremarkable, anticipate dc home. She states that she is set up for holter monitor already.   10:13 PM Labs unremarkable. Not orthostatic. She is set up for holter monitor with cardiology and I recommend that she follows up with cardiology.    Final Clinical Impressions(s) / ED Diagnoses   Final diagnoses:  None    ED Discharge Orders    None       Charlynne PanderYao,  Hsienta, MD 11/07/18 2215

## 2018-11-07 NOTE — ED Triage Notes (Signed)
Pt states she has had some dizziness off an on for a few months

## 2018-11-07 NOTE — ED Notes (Signed)
Patient verbalizes understanding of discharge instructions. Opportunity for questioning and answers were provided. Armband removed by staff, pt discharged from ED.  Ambulatory to waiting room to wait for ride

## 2018-11-07 NOTE — Discharge Instructions (Signed)
Stay hydrated.   Get your Holter monitor as ordered by cardiology   See your cardiologist for follow up   Return to ER if you have worse dizziness, palpitations, chest pain, trouble breathing

## 2018-12-05 ENCOUNTER — Telehealth (HOSPITAL_COMMUNITY): Payer: Self-pay | Admitting: Cardiology

## 2018-12-05 NOTE — Telephone Encounter (Signed)
Called to give echo appointment instructions. 

## 2018-12-06 ENCOUNTER — Other Ambulatory Visit: Payer: Self-pay

## 2018-12-06 ENCOUNTER — Ambulatory Visit (HOSPITAL_COMMUNITY): Payer: Medicaid Other | Attending: Cardiology

## 2018-12-06 DIAGNOSIS — R42 Dizziness and giddiness: Secondary | ICD-10-CM | POA: Insufficient documentation

## 2018-12-06 DIAGNOSIS — R55 Syncope and collapse: Secondary | ICD-10-CM | POA: Diagnosis present

## 2018-12-12 NOTE — Progress Notes (Signed)
Virtual Visit via Telephone Note   This visit type was conducted due to national recommendations for restrictions regarding the COVID-19 Pandemic (e.g. social distancing) in an effort to limit this patient's exposure and mitigate transmission in our community.  Due to her co-morbid illnesses, this patient is at least at moderate risk for complications without adequate follow up.  This format is felt to be most appropriate for this patient at this time.  The patient did not have access to video technology/had technical difficulties with video requiring transitioning to audio format only (telephone).  All issues noted in this document were discussed and addressed.  No physical exam could be performed with this format.  Please refer to the patient's chart for her  consent to telehealth for [facility].   Evaluation Performed:  Follow-up visit  This visit type was conducted due to national recommendations for restrictions regarding the COVID-19 Pandemic (e.g. social distancing).  This format is felt to be most appropriate for this patient at this time.  All issues noted in this document were discussed and addressed.  No physical exam was performed (except for noted visual exam findings with Video Visits).  Please refer to the patient's chart (MyChart message for video visits and phone note for telephone visits) for the patient's consent to telehealth for [facility]  Date:  12/13/2018   ID:  Joann King, DOB 06-19-1991, MRN [id number]  Patient Location:  Home  Provider location:   [place]  PCP:  Scot Jun, FNP  Cardiologist:  Fransico Him, MD Electrophysiologist:  None   Chief Complaint:  Presyncope  History of Present Illness:    Joann King is a 28 y.o. female who presents via audio/video conferencing for a telehealth visit today.    This is a pleasant 28yo AAF with a hx of GERD and HTN during pregnancy who  was seen in the ER for dizziness.  She apparently has  had dizziness off and on for several months and has been in the ER several times for evaluation.  She was seen in the ER in March and then again April 9 with dizzy spells and was diagnosed with presyncope.  She told them that whenever she go to stand up and starts to walk she is all of a sudden becomes hot and see spots in front of her eyes and has to sit down and rest.  There is no relation to meals although she was worried it was due to low blood sugar.  Labs in March were completely normal.  EKG showed normal sinus rhythm with normal intervals.   She denied any family history of sudden cardiac death.  She had not had any frank syncopal episodes.  She is estranged from her father and her mother died when the patient was 37 of an overdose.  She has 2 older sisters and a brother.  1 of her sisters has had multiple miscarriages.  She says that her symptoms of dizziness have occurred since she was pregnant in 2018 with her son.  She says she had borderline hypertension while she was pregnant.  After that she started having issues with dizzy spells after she would be sitting for a period of time and go to get up.  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  She says she will be sitting a lot and then have to get up and check a truck and ask when she starts to feel dizzy and has to catch herself.  She has never had any syncope.  She denies any diaphoresis with the events but does occasionally get nauseated and then she will also feel her heart racing.  She says it feels like a rush going to her head.  She also says that once in a while although pretty rare she will feel pins-and-needles in her chest.    When I saw her in May it was felt that her sx were related to orthostatic hypotension and possibly POTS.  She was encouraged to stay well hydrated, use compression hose while working and liberalize sodium intake.  2D echo was normal.  A heart monitor was ordered but was not done.  3 days after I saw her  she was back in the ER with dizziness.  She apparently go upset earlier that day and went to the store and developed palpitations lasting 30 minutes associated with lightheadedness. Her VS were stable and EKG was normal. She was not orthostatic in the ER and labs were normal.   She is here today for followup and is doing well. She had not had any further palpitations or dizziness since her last ER visit in the beginning of May.  She wore her heart monitor for about a week and then showered and it fell off.  She has not sent it in because she does not know the address to send it back.  She denies any  SOB, DOE, PND, orthopnea, LE edema, dizziness, palpitations or syncope.   The patient does not have symptoms concerning for COVID-19 infection (fever, chills, cough, or new shortness of breath).   Prior CV studies:   The following studies were reviewed today:  2D echo  Past Medical History:  Diagnosis Date   Anemia    during first pregnancy   Back pain    Dizziness    recurrent   Ectopic pregnancy, tubal    Heartburn in pregnancy    Herpes genitalia    doesn't think ever had outbreak   Pregnancy induced hypertension    Past Surgical History:  Procedure Laterality Date   CESAREAN SECTION  2009   CESAREAN SECTION N/A 10/07/2013   Procedure: CESAREAN SECTION;  Surgeon: Lesly DukesKelly H Leggett, MD;  Location: [facility];  Service: Obstetrics;  Laterality: N/A;   CESAREAN SECTION WITH BILATERAL TUBAL LIGATION N/A 08/19/2016   Procedure: CESAREAN SECTION;  Surgeon: Catalina AntiguaPeggy Constant, MD;  [facility];  Service: Obstetrics;  Laterality: N/A;   DIAGNOSTIC LAPAROSCOPY WITH REMOVAL OF ECTOPIC PREGNANCY Left 08/02/2015   Procedure: DIAGNOSTIC LAPAROSCOPY WITH REMOVAL OF ECTOPIC PREGNANCY;  Surgeon: Catalina AntiguaPeggy Constant, MD;  Location: [facility];  Service: Gynecology;  Laterality: Left;  for ectopic    TUBAL LIGATION Bilateral 08/19/2016   Procedure: BILATERAL TUBAL LIGATION;  Surgeon: Catalina AntiguaPeggy  Constant, MD;  [facility];  Service: Obstetrics;  Laterality: Bilateral;     No outpatient medications have been marked as taking for the 12/13/18 encounter (Telemedicine) with Quintella Reicherturner, Namita Yearwood R, MD.     Allergies:   Patient has no known allergies.   Social History   Tobacco Use   Smoking status: Former Smoker    Packs/day: 0.50    Types: Cigarettes   Smokeless tobacco: Never Used  Substance Use Topics   Alcohol use: No   Drug use: No     Family Hx: The patient's family history includes Diabetes in her maternal grandmother; Hypertension in her maternal grandmother.  ROS:   Please see the history of present illness.  All other systems reviewed and are negative.   Labs/Other Tests and Data Reviewed:    Recent Labs: 11/07/2018: ALT 33; BUN 11; Creatinine, Ser 1.08; Hemoglobin 12.4; Platelets 340; Potassium 3.7; Sodium 140; TSH 1.826   Recent Lipid Panel No results found for: CHOL, TRIG, HDL, CHOLHDL, LDLCALC, LDLDIRECT  Wt Readings from Last 3 Encounters:  12/13/18 220 lb (99.8 kg)  11/07/18 220 lb (99.8 kg)  06/17/18 210 lb (95.3 kg)     Objective:    Vital Signs:  Pulse 76    Ht 5\' 6"  (1.676 m)    Wt 220 lb (99.8 kg)    BMI 35.51 kg/m    CONSTITUTIONAL:  Well nourished, well developed female in no acute distress.  EYES: anicteric MOUTH: oral mucosa is pink RESPIRATORY: Normal respiratory effort, symmetric expansion CARDIOVASCULAR: No peripheral edema SKIN: No rash, lesions or ulcers MUSCULOSKELETAL: no digital cyanosis NEURO: Cranial Nerves II-XII grossly intact, moves all extremities PSYCH: Intact judgement and insight.  A&O x 3, Mood/affect appropriate   ASSESSMENT & PLAN:    1.  Dizziness - she had an episode of dizziness lasting 30 minutes in early May and was seen in the ER and orthostatic BPs, labs and EKG were normal.  She has not had any further episodes.  She currently is not working.  ? Etiology.  ER MD felt may be stress  related.  2.  Palpitations - she has not had any further palpitations since early May.  She wore her heart monitor for a week and then the patch fell off in the shower but she never mailed it in due to not knowing where to mail it.  I will have my nurse call her with the information.   3.  Borderline HTN - she has not liberalized her salt intake for dizziness because she is nervous that it may increase her BP.  She did not have any BP readings for today.    I have asked her to let me know if she has any further dizzy spells. If she does may need EP referral.  COVID-19 Education: The signs and symptoms of COVID-19 were discussed with the patient and how to seek care for testing (follow up with PCP or arrange E-visit).  The importance of social distancing was discussed today.  Patient Risk:   After full review of this patient's clinical status, I feel that they are at least moderate risk at this time.  Time:   Today, I have spent 15 minutes with telemedicine discussing medical problems including palpitations, dizziness.  We also reviewed the symptoms of COVID 19 and the ways to protect against contracting the virus with telehealth technology.  I spent an additional 5 minutes reviewing patient's chart including 2D echo.  Medication Adjustments/Labs and Tests Ordered: Current medicines are reviewed at length with the patient today.  Concerns regarding medicines are outlined above.  Tests Ordered: No orders of the defined types were placed in this encounter.  Medication Changes: No orders of the defined types were placed in this encounter.   Disposition:  Follow up in 4 month(s) with PA  Signed, Armanda Magicraci Tristin Gladman, MD  12/13/2018 10:30 AM    [facility]

## 2018-12-13 ENCOUNTER — Other Ambulatory Visit: Payer: Self-pay

## 2018-12-13 ENCOUNTER — Telehealth (INDEPENDENT_AMBULATORY_CARE_PROVIDER_SITE_OTHER): Payer: Self-pay | Admitting: Cardiology

## 2018-12-13 ENCOUNTER — Encounter: Payer: Self-pay | Admitting: Cardiology

## 2018-12-13 VITALS — HR 76 | Ht 66.0 in | Wt 220.0 lb

## 2018-12-13 DIAGNOSIS — R030 Elevated blood-pressure reading, without diagnosis of hypertension: Secondary | ICD-10-CM

## 2018-12-13 DIAGNOSIS — R002 Palpitations: Secondary | ICD-10-CM

## 2018-12-13 DIAGNOSIS — R42 Dizziness and giddiness: Secondary | ICD-10-CM

## 2018-12-13 DIAGNOSIS — R079 Chest pain, unspecified: Secondary | ICD-10-CM

## 2018-12-13 DIAGNOSIS — R55 Syncope and collapse: Secondary | ICD-10-CM

## 2018-12-13 NOTE — Patient Instructions (Signed)
Medication Instructions:  Your physician recommends that you continue on your current medications as directed. Please refer to the Current Medication list given to you today.  If you need a refill on your cardiac medications before your next appointment, please call your pharmacy.   Lab work: None If you have labs (blood work) drawn today and your tests are completely normal, you will receive your results only by: Marland Kitchen MyChart Message (if you have MyChart) OR . A paper copy in the mail If you have any lab test that is abnormal or we need to change your treatment, we will call you to review the results.  Testing/Procedures: Your physician has requested that you have an exercise tolerance test when we are back in the office. For further information please visit [email or web address]. Please also follow instruction sheet, as given.  Follow-Up: Call the office to schedule a 4 month follow up visit with Dr. Radford Pax  GXT INSTRUCTIONS (Stress Test) You will run on a treadmill and we will monitor your heart.  Do not drink or eat, 4 hours before, you may drink water up until the test. Wear comfortable clothing you can exercise in and tennis shoes.  Call the office at [phone], if you have any questions.

## 2019-01-26 ENCOUNTER — Other Ambulatory Visit: Payer: Self-pay

## 2019-01-26 ENCOUNTER — Ambulatory Visit (HOSPITAL_COMMUNITY)
Admission: EM | Admit: 2019-01-26 | Discharge: 2019-01-26 | Disposition: A | Discharge status: Home / Self Care | Payer: Medicaid Other | Attending: Physician Assistant | Admitting: Physician Assistant

## 2019-01-26 DIAGNOSIS — Z202 Contact with and (suspected) exposure to infections with a predominantly sexual mode of transmission: Secondary | ICD-10-CM | POA: Diagnosis not present

## 2019-01-26 DIAGNOSIS — N898 Other specified noninflammatory disorders of vagina: Secondary | ICD-10-CM

## 2019-01-26 DIAGNOSIS — Z8619 Personal history of other infectious and parasitic diseases: Secondary | ICD-10-CM

## 2019-01-26 MED ORDER — METRONIDAZOLE 500 MG PO TABS: 2000.0000 mg | ORAL_TABLET | Freq: Once | ORAL | 0 refills | Status: AC

## 2019-01-26 NOTE — ED Provider Notes (Signed)
[facility]    CSN: [id number] Arrival date & time: 01/26/19  1403     History   Chief Complaint Chief Complaint  Patient presents with  . Exposure to STD    HPI Joann ClimesDiesha Burnell BlanksM King is a 28 y.o. female.   28 year old female comes in for STD exposure.  Patient states she was diagnosed with trichomonas a few months ago.  She thought her partner got treated, but realized he did not after having intercourse with him.  States she now has another partner, and may have passed it on.  She would like to be covered for trichomonas again.  She has mild burning and discharge.  Denies urinary frequency, urgency, hematuria.  Denies abdominal pain, nausea, vomiting.  Denies fever, chills, night sweats.  LMP 01/20/2019     Past Medical History:  Diagnosis Date  . Anemia    during first pregnancy  . Back pain   . Dizziness    recurrent  . Ectopic pregnancy, tubal   . Heartburn in pregnancy   . Herpes genitalia    doesn't think ever had outbreak  . Pregnancy induced hypertension     Patient Active Problem List   Diagnosis Date Noted  . Postural dizziness with presyncope 11/04/2018  . Palpitations 11/04/2018  . Anemia 08/15/2016  . Gestational hypertension 07/20/2016  . Family history of mother as victim of domestic violence 07/20/2016  . Substance abuse (HCC) 07/20/2016  . History of cesarean delivery 10/07/2013  . Alcohol use complicating pregnancy in third trimester 10/07/2013    Past Surgical History:  Procedure Laterality Date  . CESAREAN SECTION  2009  . CESAREAN SECTION N/A 10/07/2013   Procedure: CESAREAN SECTION;  Surgeon: Lesly DukesKelly H Leggett, MD;  Location: [facility];  Service: Obstetrics;  Laterality: N/A;  . CESAREAN SECTION WITH BILATERAL TUBAL LIGATION N/A 08/19/2016   Procedure: CESAREAN SECTION;  Surgeon: Catalina AntiguaPeggy Constant, MD;  [facility];  Service: Obstetrics;  Laterality: N/A;  . DIAGNOSTIC LAPAROSCOPY WITH REMOVAL OF ECTOPIC PREGNANCY Left 08/02/2015    Procedure: DIAGNOSTIC LAPAROSCOPY WITH REMOVAL OF ECTOPIC PREGNANCY;  Surgeon: Catalina AntiguaPeggy Constant, MD;  Location: [facility];  Service: Gynecology;  Laterality: Left;  for ectopic   . TUBAL LIGATION Bilateral 08/19/2016   Procedure: BILATERAL TUBAL LIGATION;  Surgeon: Catalina AntiguaPeggy Constant, MD;  [facility];  Service: Obstetrics;  Laterality: Bilateral;    OB History    Gravida  5   Para  3   Term  3   Preterm      AB  2   Living  3     SAB      TAB  1   Ectopic  1   Multiple  0   Live Births  3            Home Medications    Prior to Admission medications   Medication Sig Start Date End Date Taking? Authorizing Provider  metroNIDAZOLE (FLAGYL) 500 MG tablet Take 4 tablets (2,000 mg total) by mouth once for 1 dose. 01/26/19 01/26/19  Belinda FisherYu, Tavonte Seybold V, PA-C    Family History Family History  Problem Relation Age of Onset  . Diabetes Maternal Grandmother   . Hypertension Maternal Grandmother     Social History Social History   Tobacco Use  . Smoking status: Former Smoker    Packs/day: 0.50    Types: Cigarettes  . Smokeless tobacco: Never Used  Substance Use Topics  . Alcohol use: No  . Drug use:  No     Allergies   Patient has no known allergies.   Review of Systems Review of Systems  Reason unable to perform ROS: See HPI as above.     Physical Exam Triage Vital Signs ED Triage Vitals  Enc Vitals Group     BP 01/26/19 1513 (!) 161/97     Pulse Rate 01/26/19 1513 84     Resp 01/26/19 1513 16     Temp 01/26/19 1513 97.9 F (36.6 C)     Temp Source 01/26/19 1513 Tympanic     SpO2 01/26/19 1513 100 %     Weight --      Height --      Head Circumference --      Peak Flow --      Pain Score 01/26/19 1514 0     Pain Loc --      Pain Edu? --      Excl. in [place]? --    No data found.  Updated Vital Signs BP (!) 161/97 (BP Location: Right Arm)   Pulse 84   Temp 97.9 F (36.6 C) (Tympanic)   Resp 16   SpO2 100%   Physical Exam  Constitutional:      General: She is not in acute distress.    Appearance: She is well-developed. She is not ill-appearing, toxic-appearing or diaphoretic.  HENT:     Head: Normocephalic and atraumatic.  Eyes:     Conjunctiva/sclera: Conjunctivae normal.     Pupils: Pupils are equal, round, and reactive to light.  Cardiovascular:     Rate and Rhythm: Normal rate and regular rhythm.     Heart sounds: Normal heart sounds. No murmur. No friction rub. No gallop.   Pulmonary:     Effort: Pulmonary effort is normal.     Breath sounds: Normal breath sounds. No wheezing or rales.  Abdominal:     General: Bowel sounds are normal.     Palpations: Abdomen is soft.     Tenderness: There is no abdominal tenderness. There is no right CVA tenderness, left CVA tenderness, guarding or rebound.  Skin:    General: Skin is warm and dry.  Neurological:     Mental Status: She is alert and oriented to person, place, and time.  Psychiatric:        Behavior: Behavior normal.        Judgment: Judgment normal.      UC Treatments / Results  Labs (all labs ordered are listed, but only abnormal results are displayed) Labs Reviewed  CERVICOVAGINAL ANCILLARY ONLY    EKG   Radiology No results found.  Procedures Procedures (including critical care time)  Medications Ordered in UC Medications - No data to display  Initial Impression / Assessment and Plan / UC Course  I have reviewed the triage vital signs and the nursing notes.  Pertinent labs & imaging results that were available during my care of the patient were reviewed by me and considered in my medical decision making (see chart for details).    Patient was treated empirically for trichomonas.  Given patient has not eaten, will call in Flagyl 2 g to pharmacy for patient to take.  Cytology sent, patient will be contacted with any positive results that require additional treatment. Patient to refrain from sexual activity for the next 7 days.  Return precautions given.   Final Clinical Impressions(s) / UC Diagnoses   Final diagnoses:  STD exposure    ED Prescriptions  Medication Sig Dispense Auth. Provider   metroNIDAZOLE (FLAGYL) 500 MG tablet Take 4 tablets (2,000 mg total) by mouth once for 1 dose. 4 tablet Threasa AlphaYu, Demetrus Pavao V, PA-C        Apolo Cutshaw V, [place] JerseyPA-C 01/26/19 1624

## 2019-01-26 NOTE — ED Triage Notes (Signed)
Per pt she was treated and had slept with the person again and he was not treated. Wants to be treated again. Burning and a small amount of discharge.

## 2019-01-26 NOTE — Discharge Instructions (Signed)
You were treated empirically for Trich. Flagyl 2g sent to pharmacy, take all at once. Cytology sent, you will be contacted with any positive results that requires further treatment. Refrain from sexual activity and alcohol use for the next 7 days. Monitor for any worsening of symptoms, fever, abdominal pain, nausea, vomiting, to follow up for reevaluation.

## 2019-01-28 LAB — CERVICOVAGINAL ANCILLARY ONLY
Chlamydia: NEGATIVE
Neisseria Gonorrhea: NEGATIVE
Trichomonas: POSITIVE — AB

## 2019-01-30 ENCOUNTER — Telehealth (HOSPITAL_COMMUNITY): Payer: Self-pay | Admitting: Emergency Medicine

## 2019-01-30 NOTE — Telephone Encounter (Signed)
Trichomonas is positive. Rx metronidazole was given at the urgent care visit. Pt needs education to please refrain from sexual intercourse for 7 days to give the medicine time to work. Sexual partners need to be notified and tested/treated. Condoms may reduce risk of reinfection. Recheck for further evaluation if symptoms are not improving.   Patient contacted and made aware of    results, all questions answered   

## 2019-03-04 ENCOUNTER — Other Ambulatory Visit: Payer: Self-pay

## 2019-03-04 ENCOUNTER — Encounter (HOSPITAL_COMMUNITY): Payer: Self-pay | Admitting: Family Medicine

## 2019-03-04 DIAGNOSIS — R42 Dizziness and giddiness: Secondary | ICD-10-CM | POA: Insufficient documentation

## 2019-03-04 DIAGNOSIS — Z5321 Procedure and treatment not carried out due to patient leaving prior to being seen by health care provider: Secondary | ICD-10-CM | POA: Diagnosis not present

## 2019-03-04 NOTE — ED Triage Notes (Signed)
Patient states she was working at [employer] when she developed dizziness. Patient states she has had these symptoms. Patient ambulated with a steady gait to triage.

## 2019-03-05 ENCOUNTER — Emergency Department (HOSPITAL_COMMUNITY)
Admission: EM | Admit: 2019-03-05 | Discharge: 2019-03-05 | Disposition: A | Discharge status: Home / Self Care | Payer: Medicaid Other | Attending: Emergency Medicine | Admitting: Emergency Medicine

## 2019-03-05 NOTE — ED Notes (Signed)
Informed by secretary that patient called out stating her intentions to leave. Went to do assessment on patient and room was empty. Searched department for patient and could not locate her. EDP made aware.

## 2019-07-01 ENCOUNTER — Encounter (HOSPITAL_COMMUNITY): Payer: Self-pay | Admitting: Emergency Medicine

## 2019-07-01 ENCOUNTER — Other Ambulatory Visit: Payer: Self-pay

## 2019-07-01 ENCOUNTER — Ambulatory Visit (HOSPITAL_COMMUNITY)
Admission: EM | Admit: 2019-07-01 | Discharge: 2019-07-01 | Disposition: A | Discharge status: Home / Self Care | Payer: Medicaid Other | Attending: Family Medicine | Admitting: Family Medicine

## 2019-07-01 DIAGNOSIS — Z20822 Contact with and (suspected) exposure to covid-19: Secondary | ICD-10-CM

## 2019-07-01 DIAGNOSIS — Z20828 Contact with and (suspected) exposure to other viral communicable diseases: Secondary | ICD-10-CM | POA: Diagnosis not present

## 2019-07-01 NOTE — ED Provider Notes (Signed)
[facility]   MRN: [id number] DOB: 27-Dec-1990  Subjective:   Joann King is a 28 y.o. female presenting for COVID-19 testing.  Patient lives with her brother, tested positive for Covid and needs to have her test and her children tested to.  Denies any symptoms.  No current facility-administered medications for this encounter. No current outpatient medications on file.   No Known Allergies  Past Medical History:  Diagnosis Date  . Anemia    during first pregnancy  . Back pain   . Dizziness    recurrent  . Ectopic pregnancy, tubal   . Heartburn in pregnancy   . Herpes genitalia    doesn't think ever had outbreak  . Pregnancy induced hypertension      Past Surgical History:  Procedure Laterality Date  . CESAREAN SECTION  2009  . CESAREAN SECTION N/A 10/07/2013   Procedure: CESAREAN SECTION;  Surgeon: Lesly Dukes, MD;  Location: [facility];  Service: Obstetrics;  Laterality: N/A;  . CESAREAN SECTION WITH BILATERAL TUBAL LIGATION N/A 08/19/2016   Procedure: CESAREAN SECTION;  Surgeon: Catalina Antigua, MD;  [facility];  Service: Obstetrics;  Laterality: N/A;  . DIAGNOSTIC LAPAROSCOPY WITH REMOVAL OF ECTOPIC PREGNANCY Left 08/02/2015   Procedure: DIAGNOSTIC LAPAROSCOPY WITH REMOVAL OF ECTOPIC PREGNANCY;  Surgeon: Catalina Antigua, MD;  [facility];  Service: Gynecology;  Laterality: Left;  for ectopic   . TUBAL LIGATION Bilateral 08/19/2016   Procedure: BILATERAL TUBAL LIGATION;  Surgeon: Catalina Antigua, MD;  [facility];  Service: Obstetrics;  Laterality: Bilateral;    Family History  Problem Relation Age of Onset  . Diabetes Maternal Grandmother   . Hypertension Maternal Grandmother     Social History   Tobacco Use  . Smoking status: Former Smoker    Packs/day: 0.50    Types: Cigarettes  . Smokeless tobacco: Never Used  Substance Use Topics  . Alcohol use: No  . Drug use: No    ROS   Objective:   Vitals: BP  136/82   Pulse 91   Temp 98 F (36.7 C) (Oral)   Resp 16   SpO2 99%   Physical Exam Constitutional:      General: She is not in acute distress.    Appearance: Normal appearance. She is well-developed. She is not ill-appearing.  HENT:     Head: Normocephalic and atraumatic.     Nose: Nose normal.     Mouth/Throat:     Mouth: Mucous membranes are moist.     Pharynx: Oropharynx is clear.  Eyes:     General: No scleral icterus.    Extraocular Movements: Extraocular movements intact.     Pupils: Pupils are equal, round, and reactive to light.  Cardiovascular:     Rate and Rhythm: Normal rate.  Pulmonary:     Effort: Pulmonary effort is normal.  Skin:    General: Skin is warm and dry.  Neurological:     General: No focal deficit present.     Mental Status: She is alert and oriented to person, place, and time.  Psychiatric:        Mood and Affect: Mood normal.        Behavior: Behavior normal.       Assessment and Plan :   1. Exposure to COVID-19 virus    Counseled patient on nature of COVID-19 including modes of transmission, diagnostic testing, management and supportive care.  Counseled on medications used for symptomatic relief. COVID  19 testing is pending. Counseled patient on potential for adverse effects with medications prescribed/recommended today, ER and return-to-clinic precautions discussed, patient verbalized understanding.     Jaynee Eagles, PA-C 07/01/19 1023

## 2019-07-01 NOTE — Discharge Instructions (Signed)
For sore throat or cough try using a honey-based tea. Use 3 teaspoons of honey with juice squeezed from half lemon. Place shaved pieces of ginger into 1/2-1 cup of water and warm over stove top. Then mix the ingredients and repeat every 4 hours as needed. Please take Tylenol 500mg every 6 hours. Hydrate very well with at least 2 liters of water. Eat light meals such as soups to replenish electrolytes and soft fruits, veggies. Start an antihistamine like Zyrtec (cetirizine) at 10mg daily for postnasal drainage, sinus congestion.  You can take this together with pseudoephedrine (Sudafed) at a dose of 60 mg 3 times a day or twice daily as needed for the same kind of congestion.   

## 2019-07-01 NOTE — ED Triage Notes (Signed)
PT's brother is COVID positive. He is staying with her. She brought the rest of the family to get tested. No symptoms.

## 2019-07-03 LAB — NOVEL CORONAVIRUS, NAA (HOSP ORDER, SEND-OUT TO REF LAB; TAT 18-24 HRS): SARS-CoV-2, NAA: NOT DETECTED

## 2019-07-14 NOTE — Progress Notes (Deleted)
Cardiology Office Note    Date:  07/14/2019   ID:  [name], DOB 08-03-1990, MRN [id number]  PCP:  Bing Neighbors, FNP  Cardiologist: No primary care provider on file. EPS: None  No chief complaint on file.   History of Present Illness:  Joann King is a 29 y.o. female with history of pregnancy-induced hypertension and GERD.  She had a telemedicine visit with Dr. Mayford Knife 12/13/2018 for dizziness and multiple emergency room visits for such.  EKGs were normal.  She was not orthostatic.  She wore a heart monitor for a week but then the patch fell off and she never mailed it in.  Was asked to stay hydrated.  2D echo 12/06/2018 was normal    Past Medical History:  Diagnosis Date  . Anemia    during first pregnancy  . Back pain   . Dizziness    recurrent  . Ectopic pregnancy, tubal   . Heartburn in pregnancy   . Herpes genitalia    doesn't think ever had outbreak  . Pregnancy induced hypertension     Past Surgical History:  Procedure Laterality Date  . CESAREAN SECTION  2009  . CESAREAN SECTION N/A 10/07/2013   Procedure: CESAREAN SECTION;  Surgeon: Lesly Dukes, MD;  Location: [facility];  Service: Obstetrics;  Laterality: N/A;  . CESAREAN SECTION WITH BILATERAL TUBAL LIGATION N/A 08/19/2016   Procedure: CESAREAN SECTION;  Surgeon: Catalina Antigua, MD;  Location: [facility];  Service: Obstetrics;  Laterality: N/A;  . DIAGNOSTIC LAPAROSCOPY WITH REMOVAL OF ECTOPIC PREGNANCY Left 08/02/2015   Procedure: DIAGNOSTIC LAPAROSCOPY WITH REMOVAL OF ECTOPIC PREGNANCY;  Surgeon: Catalina Antigua, MD;  Location: [facility];  Service: Gynecology;  Laterality: Left;  for ectopic   . TUBAL LIGATION Bilateral 08/19/2016   Procedure: BILATERAL TUBAL LIGATION;  Surgeon: Catalina Antigua, MD;  Location: [facility];  Service: Obstetrics;  Laterality: Bilateral;    Current Medications: No outpatient medications have been marked as taking for the 07/15/19 encounter (Appointment) with  Dyann Kief, PA-C.     Allergies:   Patient has no known allergies.   Social History   Socioeconomic History  . Marital status: Significant Other    Spouse name: Not on file  . Number of children: Not on file  . Years of education: Not on file  . Highest education level: Not on file  Occupational History  . Not on file  Tobacco Use  . Smoking status: Former Smoker    Packs/day: 0.50    Types: Cigarettes  . Smokeless tobacco: Never Used  Substance and Sexual Activity  . Alcohol use: No  . Drug use: No  . Sexual activity: Not on file  Other Topics Concern  . Not on file  Social History Narrative  . Not on file   Social Determinants of Health   Financial Resource Strain:   . Difficulty of Paying Living Expenses: Not on file  Food Insecurity:   . Worried About Programme researcher, broadcasting/film/video in the Last Year: Not on file  . Ran Out of Food in the Last Year: Not on file  Transportation Needs:   . Lack of Transportation (Medical): Not on file  . Lack of Transportation (Non-Medical): Not on file  Physical Activity:   . Days of Exercise per Week: Not on file  . Minutes of Exercise per Session: Not on file  Stress:   . Feeling of Stress : Not on file  Social Connections:   .  Frequency of Communication with Friends and Family: Not on file  . Frequency of Social Gatherings with Friends and Family: Not on file  . Attends Religious Services: Not on file  . Active Member of Clubs or Organizations: Not on file  . Attends Archivist Meetings: Not on file  . Marital Status: Not on file     Family History:  The patient's ***family history includes Diabetes in her maternal grandmother; Hypertension in her maternal grandmother.   ROS:   Please see the history of present illness.    ROS All other systems reviewed and are negative.   PHYSICAL EXAM:   VS:  There were no vitals taken for this visit.  Physical Exam  GEN: Well nourished, well developed, in no acute distress   HEENT: normal  Neck: no JVD, carotid bruits, or masses Cardiac:RRR; no murmurs, rubs, or gallops  Respiratory:  clear to auscultation bilaterally, normal work of breathing GI: soft, nontender, nondistended, + BS Ext: without cyanosis, clubbing, or edema, Good distal pulses bilaterally MS: no deformity or atrophy  Skin: warm and dry, no rash Neuro:  Alert and Oriented x 3, Strength and sensation are intact Psych: euthymic mood, full affect  Wt Readings from Last 3 Encounters:  03/04/19 230 lb (104.3 kg)  12/13/18 220 lb (99.8 kg)  11/07/18 220 lb (99.8 kg)      Studies/Labs Reviewed:   EKG:  EKG is*** ordered today.  The ekg ordered today demonstrates ***  Recent Labs: 11/07/2018: ALT 33; BUN 11; Creatinine, Ser 1.08; Hemoglobin 12.4; Platelets 340; Potassium 3.7; Sodium 140; TSH 1.826   Lipid Panel No results found for: CHOL, TRIG, HDL, CHOLHDL, VLDL, LDLCALC, LDLDIRECT  Additional studies/ records that were reviewed today include:  2D echo 12/2018 IMPRESSIONS      1. The left ventricle has normal systolic function with an ejection fraction of 60-65%. The cavity size was normal. Left ventricular diastolic parameters were normal. No evidence of left ventricular regional wall motion abnormalities.  2. The right ventricle has normal systolic function. The cavity was normal. There is no increase in right ventricular wall thickness.   FINDINGS  Left Ventricle: The left ventricle has normal systolic function, with an ejection fraction of 60-65%. The cavity size was normal. There is no increase in left ventricular wall thickness. Left ventricular diastolic parameters were normal. Normal left  ventricular filling pressures No evidence of left ventricular regional wall motion abnormalities..   Right Ventricle: The right ventricle has normal systolic function. The cavity was normal. There is no increase in right ventricular wall thickness.   Left Atrium: Left atrial size was normal in  size.   Right Atrium: Right atrial size was normal in size. Right atrial pressure is estimated at 10 mmHg.   Interatrial Septum: No atrial level shunt detected by color flow Doppler.   Pericardium: There is no evidence of pericardial effusion.   Mitral Valve: The mitral valve is normal in structure. Mitral valve regurgitation is trivial by color flow Doppler.   Tricuspid Valve: The tricuspid valve is normal in structure. Tricuspid valve regurgitation is trivial by color flow Doppler.   Aortic Valve: The aortic valve is normal in structure. Aortic valve regurgitation was not visualized by color flow Doppler.   Pulmonic Valve: The pulmonic valve was normal in structure. Pulmonic valve regurgitation is not visualized by color flow Doppler.   Venous: The inferior vena cava measures 1.10 cm, is normal in size with greater than 50% respiratory variability.  ASSESSMENT:    No diagnosis found.   PLAN:  In order of problems listed above:  Dizziness with multiple ER visits for such.  EKGs have been normal labs normal.  Orthostatics have been normal.  2D echo normal, Holter monitor only worn for a week and not mailed back.  Pregnancy-induced hypertension    Medication Adjustments/Labs and Tests Ordered: Current medicines are reviewed at length with the patient today.  Concerns regarding medicines are outlined above.  Medication changes, Labs and Tests ordered today are listed in the Patient Instructions below. There are no Patient Instructions on file for this visit.   Elson Clan, PA-C  07/14/2019 3:38 PM    [facility] [address] [place], [place], [place]  [address] Phone: [phone]; Fax: [phone]

## 2019-07-15 ENCOUNTER — Ambulatory Visit: Payer: Medicaid Other | Admitting: Physician Assistant

## 2019-07-16 ENCOUNTER — Telehealth: Payer: Self-pay | Admitting: Radiology

## 2019-07-16 NOTE — Telephone Encounter (Signed)
Exercise treadmill test was ordered back in June 2020 when the treadmill room was closed due to Covid. Please advise if patient still needs test scheduled.  If no longer needed please cancel order.

## 2019-07-16 NOTE — Telephone Encounter (Signed)
Left message to call back  

## 2019-07-16 NOTE — Telephone Encounter (Signed)
Please find out if patient has had any more symptoms

## 2019-07-21 NOTE — Telephone Encounter (Signed)
Patient states that she is still having chest pain and dizziness. She reports occasional palpitations. She is interested in having ETT done.

## 2019-07-21 NOTE — Telephone Encounter (Signed)
OK to proceed with ETT

## 2019-09-11 ENCOUNTER — Other Ambulatory Visit: Payer: Self-pay

## 2019-09-11 ENCOUNTER — Ambulatory Visit (HOSPITAL_COMMUNITY)
Admission: EM | Admit: 2019-09-11 | Discharge: 2019-09-11 | Disposition: A | Discharge status: Home / Self Care | Payer: Medicaid Other | Attending: Family Medicine | Admitting: Family Medicine

## 2019-09-11 ENCOUNTER — Encounter (HOSPITAL_COMMUNITY): Payer: Self-pay | Admitting: Emergency Medicine

## 2019-09-11 DIAGNOSIS — I10 Essential (primary) hypertension: Secondary | ICD-10-CM | POA: Diagnosis not present

## 2019-09-11 MED ORDER — AMLODIPINE BESYLATE 10 MG PO TABS: 10.0000 mg | ORAL_TABLET | Freq: Every day | ORAL | 0 refills | Status: DC

## 2019-09-11 NOTE — Discharge Instructions (Addendum)
Take the BP medication daily Follow up with primary care

## 2019-09-11 NOTE — ED Triage Notes (Signed)
For 4 weeks has had dizziness, headaches, patient is 'scared".  Patient reports intermittent pain in chest.  Patient is very scared.  Patient has been taking blood pressure at home.

## 2019-09-11 NOTE — ED Provider Notes (Signed)
[facility]    CSN: [id number] Arrival date & time: 09/11/19  0175      History   Chief Complaint Chief Complaint  Patient presents with  . Hypertension    HPI Joann King is a 29 y.o. female.   HPI  Very pleasant 29 year old woman. She has a 62-year-old at home. During her pregnancy she had problems with hypertension and required medication. After the baby was born she was able to go off of medication. Lately she has been having some dizzy spells. I reviewed her chart and she has had emergency room visits. She had cardiology consultation. She had an echocardiogram, EKG, and lab work. These results are reviewed She has a godmother who is an Charity fundraiser. She is very close to her. Godmother is on the telephone as I enter the room. She tells me that she has been taking Ms. Mcginness blood pressure twice a day when she can, once a day and other days. Is been as high as 180/104. It stays in the 1 50-1 80 range, over 90 - 100. Patient states that when her blood pressure is high she sometimes feels dizzy. Sometimes has blurred vision. Needs to sit down. She has missed time from work because of her blood pressure. Patient does not have a family doctor. She has scheduled an appointment for the next couple of weeks. She is here requesting to be placed on medication until she sees her new primary doctor. She agrees that she is likely stressed. She thinks anxiety is contributing to her blood pressure. Patient states that she has been taking her godmother's blood pressure pills. She has been taking amlodipine 10 mg a day. She did for the last couple of nights and her blood pressure has been much better. Her blood pressure here today is 120/69.   Past Medical History:  Diagnosis Date  . Anemia    during first pregnancy  . Back pain   . Dizziness    recurrent  . Ectopic pregnancy, tubal   . Heartburn in pregnancy   . Herpes genitalia    doesn't think ever had outbreak  . Pregnancy induced  hypertension     Patient Active Problem List   Diagnosis Date Noted  . Postural dizziness with presyncope 11/04/2018  . Palpitations 11/04/2018  . Anemia 08/15/2016  . Gestational hypertension 07/20/2016  . Family history of mother as victim of domestic violence 07/20/2016  . Substance abuse (HCC) 07/20/2016  . History of cesarean delivery 10/07/2013  . Alcohol use complicating pregnancy in third trimester 10/07/2013    Past Surgical History:  Procedure Laterality Date  . CESAREAN SECTION  2009  . CESAREAN SECTION N/A 10/07/2013   Procedure: CESAREAN SECTION;  Surgeon: Lesly Dukes, MD;  Location: [facility];  Service: Obstetrics;  Laterality: N/A;  . CESAREAN SECTION WITH BILATERAL TUBAL LIGATION N/A 08/19/2016   Procedure: CESAREAN SECTION;  Surgeon: Catalina Antigua, MD;  [facility]: [facility];  Service: Obstetrics;  Laterality: N/A;  . DIAGNOSTIC LAPAROSCOPY WITH REMOVAL OF ECTOPIC PREGNANCY Left 08/02/2015   Procedure: DIAGNOSTIC LAPAROSCOPY WITH REMOVAL OF ECTOPIC PREGNANCY;  Surgeon: Catalina Antigua, MD;  Location: [facility];  Service: Gynecology;  Laterality: Left;  for ectopic   . TUBAL LIGATION Bilateral 08/19/2016   Procedure: BILATERAL TUBAL LIGATION;  Surgeon: Catalina Antigua, MD;  Location: [facility];  Service: Obstetrics;  Laterality: Bilateral;    OB History    Gravida  5   Para  3   Term  3   Preterm      AB  2   Living  3     SAB      TAB  1   Ectopic  1   Multiple  0   Live Births  3            Home Medications    Prior to Admission medications   Medication Sig Start Date End Date Taking? Authorizing Provider  NON FORMULARY Family members blood pressure medicine, started taking one a day since last thursday   Yes [provider]  amLODipine (NORVASC) 10 MG tablet Take 1 tablet (10 mg total) by mouth daily. 09/11/19   Raylene Everts, MD    Family History Family History  Problem Relation Age of Onset  . Diabetes  Maternal Grandmother   . Hypertension Maternal Grandmother     Social History Social History   Tobacco Use  . Smoking status: Former Smoker    Packs/day: 0.50    Types: Cigarettes  . Smokeless tobacco: Never Used  Substance Use Topics  . Alcohol use: No  . Drug use: No     Allergies   Patient has no known allergies.   Review of Systems Review of Systems  Constitutional: Positive for unexpected weight change. Negative for chills and fever.       Godmother states she has gained weight  HENT: Negative for congestion and hearing loss.   Eyes: Positive for visual disturbance. Negative for pain.  Respiratory: Negative for cough and shortness of breath.   Cardiovascular: Negative for chest pain and leg swelling.  Gastrointestinal: Negative for abdominal pain, constipation and diarrhea.  Genitourinary: Negative for dysuria and frequency.  Musculoskeletal: Negative for myalgias.  Neurological: Positive for dizziness and headaches. Negative for seizures.       Some visual disturbance, dizziness, headache, when her blood pressure is elevated  Psychiatric/Behavioral: The patient is nervous/anxious.      Physical Exam Triage Vital Signs ED Triage Vitals  Enc Vitals Group     BP 09/11/19 0908 120/69     Pulse Rate 09/11/19 0908 88     Resp 09/11/19 0908 20     Temp 09/11/19 0908 98.7 F (37.1 C)     Temp Source 09/11/19 0908 Oral     SpO2 09/11/19 0908 99 %     Weight --      Height --      Head Circumference --      Peak Flow --      Pain Score 09/11/19 0904 0     Pain Loc --      Pain Edu? --      Excl. in [place]? --    No data found.  Updated Vital Signs BP 120/69 (BP Location: Right Arm) Comment (BP Location): large cuff  Pulse 88   Temp 98.7 F (37.1 C) (Oral)   Resp 20   LMP 08/10/2019   SpO2 99%   Physical Exam Constitutional:      General: She is not in acute distress.    Appearance: She is well-developed. She is obese.  HENT:     Head: Normocephalic  and atraumatic.     Mouth/Throat:     Comments: Mask in place Eyes:     Conjunctiva/sclera: Conjunctivae normal.     Pupils: Pupils are equal, round, and reactive to light.  Cardiovascular:     Rate and Rhythm: Normal rate and regular rhythm.     Heart sounds: Normal  heart sounds. No murmur.  Pulmonary:     Effort: Pulmonary effort is normal. No respiratory distress.     Breath sounds: Normal breath sounds. No wheezing or rales.  Abdominal:     General: There is no distension.     Palpations: Abdomen is soft.  Musculoskeletal:        General: Normal range of motion.     Cervical back: Normal range of motion.  Skin:    General: Skin is warm and dry.  Neurological:     Mental Status: She is alert.  Psychiatric:        Mood and Affect: Mood normal.        Behavior: Behavior normal.      UC Treatments / Results  Labs (all labs ordered are listed, but only abnormal results are displayed) Labs Reviewed - No data to display  EKG   Radiology No results found.  Procedures Procedures (including critical care time)  Medications Ordered in UC Medications - No data to display  Initial Impression / Assessment and Plan / UC Course  I have reviewed the triage vital signs and the nursing notes.  Pertinent labs & imaging results that were available during my care of the patient were reviewed by me and considered in my medical decision making (see chart for details).     I discussed with patient that she should not take her godmother's blood pressure medication. However, it does appear to be working well. We will give her 30 days of amlodipine until she can see a PCP. Diet, exercise, and stress reduction are also discussed Final Clinical Impressions(s) / UC Diagnoses   Final diagnoses:  Essential hypertension     Discharge Instructions     Take the BP medication daily Follow up with primary care   ED Prescriptions    Medication Sig Dispense Auth. Provider    amLODipine (NORVASC) 10 MG tablet Take 1 tablet (10 mg total) by mouth daily. 30 tablet Eustace Moore, MD     PDMP not reviewed this encounter.   Eustace Moore, MD 09/11/19 [phone]

## 2019-09-11 NOTE — ED Triage Notes (Signed)
Name of blood pressure medicine is amlodipine 10 mg.  Diastolic 95-110 Systolic 150-170  These are the readings that godmother has provided.

## 2019-09-30 ENCOUNTER — Ambulatory Visit
Admission: EM | Admit: 2019-09-30 | Discharge: 2019-09-30 | Disposition: A | Discharge status: Home / Self Care | Payer: Medicaid Other

## 2019-09-30 DIAGNOSIS — R519 Headache, unspecified: Secondary | ICD-10-CM | POA: Diagnosis not present

## 2019-09-30 NOTE — ED Triage Notes (Signed)
Pt presents to UC with right sided headache x 1 day. Ibuprofen relief the pain. Pt reports she was evaluated by EMS yesterday, as she had a bad right-sided headache and sharp chest pain. Pt states EKG done by EMS was normal. EMS suggested to follow up with UC or ED. Pt denies any chest pain at this moment.

## 2019-09-30 NOTE — ED Provider Notes (Signed)
[name]    CSN: [id number] Arrival date & time: 09/30/19  4332      History   Chief Complaint Chief Complaint  Patient presents with  . Headache    HPI Joann RUNNELS is a 29 y.o. female.   Accompanied by her mother, patient presents with headache on her right side x1 day. She is currently asymptomatic; she took ibuprofen which relieved her headache.  She called EMS yesterday for this headache and also chest pain; EKG was done by EMS and patient reports this was normal; she did not go to the ED for evaluation. Patient is followed by Cardiology; last seen 12/13/2018; diagnosed with "Dizziness, Palpitations, Borderline HTN".   She requests "basic lab work" be done today; She does not have a PCP currently but is searching for one.  She denies current headache, dizziness, palpitations, weakness, chest pain, shortness of breath, or other symptoms.    The history is provided by the patient and a parent.    Past Medical History:  Diagnosis Date  . Anemia    during first pregnancy  . Back pain   . Dizziness    recurrent  . Ectopic pregnancy, tubal   . Heartburn in pregnancy   . Herpes genitalia    doesn't think ever had outbreak  . Pregnancy induced hypertension     Patient Active Problem List   Diagnosis Date Noted  . Postural dizziness with presyncope 11/04/2018  . Palpitations 11/04/2018  . Anemia 08/15/2016  . Gestational hypertension 07/20/2016  . Family history of mother as victim of domestic violence 07/20/2016  . Substance abuse ([place]) 07/20/2016  . History of cesarean delivery 10/07/2013  . Alcohol use complicating pregnancy in third trimester 10/07/2013    Past Surgical History:  Procedure Laterality Date  . CESAREAN SECTION  2009  . CESAREAN SECTION N/A 10/07/2013   Procedure: CESAREAN SECTION;  Surgeon: Guss Bunde, MD;  Location: [facility];  Service: Obstetrics;  Laterality: N/A;  . CESAREAN SECTION WITH BILATERAL TUBAL LIGATION N/A 08/19/2016   Procedure: CESAREAN SECTION;  Surgeon: Mora Bellman, MD;  Location: [facility];  Service: Obstetrics;  Laterality: N/A;  . DIAGNOSTIC LAPAROSCOPY WITH REMOVAL OF ECTOPIC PREGNANCY Left 08/02/2015   Procedure: DIAGNOSTIC LAPAROSCOPY WITH REMOVAL OF ECTOPIC PREGNANCY;  Surgeon: Mora Bellman, MD;  Location: [facility];  Service: Gynecology;  Laterality: Left;  for ectopic   . TUBAL LIGATION Bilateral 08/19/2016   Procedure: BILATERAL TUBAL LIGATION;  Surgeon: Mora Bellman, MD;  Location: [facility];  Service: Obstetrics;  Laterality: Bilateral;    OB History    Gravida  5   Para  3   Term  3   Preterm      AB  2   Living  3     SAB      TAB  1   Ectopic  1   Multiple  0   Live Births  3            Home Medications    Prior to Admission medications   Medication Sig Start Date End Date Taking? Authorizing Provider  Ferrous Gluconate-C-Folic Acid (IRON-C PO) Take by mouth.   Yes [provider]  ibuprofen (ADVIL) 200 MG tablet Take 200 mg by mouth every 6 (six) hours as needed.   Yes [provider]  amLODipine (NORVASC) 10 MG tablet Take 1 tablet (10 mg total) by mouth daily. 09/11/19   Raylene Everts, MD  NON FORMULARY Family  members blood pressure medicine, started taking one a day since last thursday    [provider]    Family History Family History  Problem Relation Age of Onset  . Diabetes Maternal Grandmother   . Hypertension Maternal Grandmother     Social History Social History   Tobacco Use  . Smoking status: Former Smoker    Packs/day: 0.50    Types: Cigarettes  . Smokeless tobacco: Never Used  Substance Use Topics  . Alcohol use: No  . Drug use: No     Allergies   Patient has no known allergies.   Review of Systems Review of Systems  Constitutional: Negative for chills and fever.  HENT: Negative for ear pain and sore throat.   Eyes: Negative for pain and visual disturbance.  Respiratory:  Negative for cough and shortness of breath.   Cardiovascular: Negative for chest pain and palpitations.  Gastrointestinal: Negative for abdominal pain, diarrhea, nausea and vomiting.  Genitourinary: Negative for dysuria and hematuria.  Musculoskeletal: Negative for arthralgias and back pain.  Skin: Negative for color change and rash.  Neurological: Positive for headaches. Negative for dizziness, tremors, seizures, syncope, facial asymmetry, speech difficulty, weakness, light-headedness and numbness.  All other systems reviewed and are negative.    Physical Exam Triage Vital Signs ED Triage Vitals  Enc Vitals Group     BP 09/30/19 0826 113/75     Pulse Rate 09/30/19 0826 78     Resp 09/30/19 0826 18     Temp 09/30/19 0826 98.4 F (36.9 C)     Temp Source 09/30/19 0826 Oral     SpO2 09/30/19 0826 96 %     Weight --      Height --      Head Circumference --      Peak Flow --      Pain Score 09/30/19 0824 7     Pain Loc --      Pain Edu? --      Excl. in GC? --    No data found.  Updated Vital Signs BP 113/75 (BP Location: Left Arm)   Pulse 78   Temp 98.4 F (36.9 C) (Oral)   Resp 18   LMP  (Within Weeks) Comment: 2 weeks  SpO2 96%   Visual Acuity Right Eye Distance:   Left Eye Distance:   Bilateral Distance:    Right Eye Near:   Left Eye Near:    Bilateral Near:     Physical Exam Vitals and nursing note reviewed.  Constitutional:      General: She is not in acute distress.    Appearance: She is well-developed. She is not ill-appearing.  HENT:     Head: Normocephalic and atraumatic.     Right Ear: Tympanic membrane normal.     Left Ear: Tympanic membrane normal.     Nose: Nose normal.     Mouth/Throat:     Mouth: Mucous membranes are moist.     Pharynx: Oropharynx is clear.  Eyes:     Extraocular Movements: Extraocular movements intact.     Conjunctiva/sclera: Conjunctivae normal.     Pupils: Pupils are equal, round, and reactive to light.   Cardiovascular:     Rate and Rhythm: Normal rate and regular rhythm.     Heart sounds: No murmur.  Pulmonary:     Effort: Pulmonary effort is normal. No respiratory distress.     Breath sounds: Normal breath sounds.  Abdominal:     General: Bowel  sounds are normal.     Palpations: Abdomen is soft.     Tenderness: There is no abdominal tenderness. There is no guarding or rebound.  Musculoskeletal:        General: Normal range of motion.     Cervical back: Neck supple.     Right lower leg: No edema.     Left lower leg: No edema.  Skin:    General: Skin is warm and dry.     Findings: No rash.  Neurological:     General: No focal deficit present.     Mental Status: She is alert and oriented to person, place, and time.     Sensory: No sensory deficit.     Motor: No weakness.     Coordination: Coordination normal.     Gait: Gait normal.  Psychiatric:        Mood and Affect: Mood normal.        Behavior: Behavior normal.      UC Treatments / Results  Labs (all labs ordered are listed, but only abnormal results are displayed) Labs Reviewed  CBC  BASIC METABOLIC PANEL    EKG   Radiology No results found.  Procedures Procedures (including critical care time)  Medications Ordered in UC Medications - No data to display  Initial Impression / Assessment and Plan / UC Course  I have reviewed the triage vital signs and the nursing notes.  Pertinent labs & imaging results that were available during my care of the patient were reviewed by me and considered in my medical decision making (see chart for details).   Acute non-intractable headache.  Patient is currently asymptomatic and well-appearing.  CBC and BMP pending.  Discussed with patient that she needs to establish a PCP as soon as possible.  Instructed her to go to the ED if she has acute worsening symptoms.  Patient agrees to plan of care.      Final Clinical Impressions(s) / UC Diagnoses   Final diagnoses:   Acute nonintractable headache, unspecified headache type     Discharge Instructions     The lab work results should be available in [employer].    Follow up with your new primary care provider as soon as possible.    Go to the emergency department if you have acute worsening symptoms.        ED Prescriptions    None     PDMP not reviewed this encounter.   Mickie Bail, NP 09/30/19 [phone]

## 2019-09-30 NOTE — Discharge Instructions (Signed)
The lab work results should be available in [employer].    Follow up with your new primary care provider as soon as possible.    Go to the emergency department if you have acute worsening symptoms.

## 2019-10-01 LAB — BASIC METABOLIC PANEL
BUN/Creatinine Ratio: 10 (ref 9–23)
BUN: 9 mg/dL (ref 6–20)
CO2: 22 mmol/L (ref 20–29)
Calcium: 9.7 mg/dL (ref 8.7–10.2)
Chloride: 104 mmol/L (ref 96–106)
Creatinine, Ser: 0.91 mg/dL (ref 0.57–1.00)
GFR calc Af Amer: 99 mL/min/{1.73_m2} (ref >59)
GFR calc non Af Amer: 86 mL/min/{1.73_m2} (ref >59)
Glucose: 92 mg/dL (ref 65–99)
Potassium: 4.2 mmol/L (ref 3.5–5.2)
Sodium: 139 mmol/L (ref 134–144)

## 2019-10-01 LAB — CBC
Hematocrit: 38.5 % (ref 34.0–46.6)
Hemoglobin: 12.5 g/dL (ref 11.1–15.9)
MCH: 28 pg (ref 26.6–33.0)
MCHC: 32.5 g/dL (ref 31.5–35.7)
MCV: 86 fL (ref 79–97)
Platelets: 468 10*3/uL — ABNORMAL HIGH (ref 150–450)
RBC: 4.46 x10E6/uL (ref 3.77–5.28)
RDW: 13.8 % (ref 11.7–15.4)
WBC: 6.4 10*3/uL (ref 3.4–10.8)

## 2019-10-10 ENCOUNTER — Encounter: Payer: Self-pay | Admitting: Primary Care

## 2019-10-10 ENCOUNTER — Other Ambulatory Visit: Payer: Self-pay

## 2019-10-10 ENCOUNTER — Ambulatory Visit: Payer: Self-pay | Admitting: Primary Care

## 2019-10-10 VITALS — BP 122/76 | HR 80 | Temp 96.7°F | Ht 66.0 in | Wt 277.0 lb

## 2019-10-10 DIAGNOSIS — D649 Anemia, unspecified: Secondary | ICD-10-CM

## 2019-10-10 DIAGNOSIS — F331 Major depressive disorder, recurrent, moderate: Secondary | ICD-10-CM | POA: Insufficient documentation

## 2019-10-10 DIAGNOSIS — I10 Essential (primary) hypertension: Secondary | ICD-10-CM | POA: Diagnosis not present

## 2019-10-10 DIAGNOSIS — Z113 Encounter for screening for infections with a predominantly sexual mode of transmission: Secondary | ICD-10-CM | POA: Insufficient documentation

## 2019-10-10 DIAGNOSIS — F411 Generalized anxiety disorder: Secondary | ICD-10-CM

## 2019-10-10 HISTORY — DX: Major depressive disorder, recurrent, moderate: F33.1

## 2019-10-10 HISTORY — DX: Generalized anxiety disorder: F41.1

## 2019-10-10 MED ORDER — SERTRALINE HCL 50 MG PO TABS: 50.0000 mg | ORAL_TABLET | Freq: Every day | ORAL | 1 refills | Status: DC

## 2019-10-10 MED ORDER — AMLODIPINE BESYLATE 5 MG PO TABS: 5.0000 mg | ORAL_TABLET | Freq: Every day | ORAL | 0 refills | Status: DC

## 2019-10-10 NOTE — Assessment & Plan Note (Signed)
Asymptomatic, testing pending.

## 2019-10-10 NOTE — Progress Notes (Signed)
Subjective:    Patient ID: Joann King, female    DOB: 1990-07-31, 29 y.o.   MRN: [id number]  HPI  This visit occurred during the SARS-CoV-2 public health emergency.  Safety protocols were in place, including screening questions prior to the visit, additional usage of staff PPE, and extensive cleaning of exam room while observing appropriate contact time as indicated for disinfecting solutions.   Joann King is a 29 year old female who presents today to establish care and discuss the problems mentioned below. Will obtain/review records. She would also like to be screened for STD's.  1) Chest pain/Dizziness: Evaluated last week at [facility] with right sided headache and chest pain. Paramedics evaluated at her home prior, completed ECG and vitals. ECG was "normal". She is following with cardiology, last evaluated in June 2020.  She underwent lab work that was drawn at Urgent Care. Labs including BMP were unremarkable. CBC with platelet level of 468, otherwise unremarkable.   During her last visit with cardiology in June 2020 she endorsed feeling better. She has undergone Holter Monitor evaluation for 14 days, no results noted in chart. Echocardiogram in June 2020 was normal. She never completed the stress test.   She is currently managed on Amlodipine 10 mg which was initiated in Mid March at urgent care in [place]. She's checking her BP at home and she thinks the numbers are "normal". Doesn't remember readings.   BP Readings from Last 3 Encounters:  10/10/19 122/76  09/30/19 113/75  09/11/19 120/69   Today she's feeling better, her headache has resolved, dizziness is intermittent. She is taking oral iron, has been doing so for the last one week. She has noticed swelling to her ankles over the last 2-3 weeks. 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   2) Anxiety and Depression: Symptoms of sadness/feeling down, little  motivation to do things, doesn't want to be around other people, tearfulness. Symptoms began about one year ago. Post partum with her last child in February 2018. GAD 7 score of 16 and PHQ 9 score of 21 today. Denies SI/HI.  She has never sought treatment for these symptoms.   Review of Systems  Eyes: Negative for visual disturbance.  Respiratory: Negative for shortness of breath.   Cardiovascular: Positive for leg swelling. Negative for chest pain.  Genitourinary: Negative for vaginal discharge.  Neurological: Negative for headaches.  Psychiatric/Behavioral: Negative for suicidal ideas. The patient is nervous/anxious.        See HPI       Past Medical History:  Diagnosis Date  . Anemia    during first pregnancy  . Back pain   . Dizziness    recurrent  . Ectopic pregnancy, tubal   . Heartburn in pregnancy   . Herpes genitalia    doesn't think ever had outbreak  . Pregnancy induced hypertension      Social History   Socioeconomic History  . Marital status: Significant Other    Spouse name: Not on file  . Number of children: Not on file  . Years of education: Not on file  . Highest education level: Not on file  Occupational History  . Not on file  Tobacco Use  . Smoking status: Former Smoker    Packs/day: 0.50    Types: Cigarettes  . Smokeless tobacco: Never Used  Substance and Sexual Activity  . Alcohol use: No  . Drug use: No  .  Sexual activity: Not on file  Other Topics Concern  . Not on file  Social History Narrative  . Not on file   Social Determinants of Health   Financial Resource Strain:   . Difficulty of Paying Living Expenses:   Food Insecurity:   . Worried About Programme researcher, broadcasting/film/video in the Last Year:   . Barista in the Last Year:   Transportation Needs:   . Freight forwarder (Medical):   Marland Kitchen Lack of Transportation (Non-Medical):   Physical Activity:   . Days of Exercise per Week:   . Minutes of Exercise per Session:   Stress:   .  Feeling of Stress :   Social Connections:   . Frequency of Communication with Friends and Family:   . Frequency of Social Gatherings with Friends and Family:   . Attends Religious Services:   . Active Member of Clubs or Organizations:   . Attends Banker Meetings:   Marland Kitchen Marital Status:   Intimate Partner Violence:   . Fear of Current or Ex-Partner:   . Emotionally Abused:   Marland Kitchen Physically Abused:   . Sexually Abused:     Past Surgical History:  Procedure Laterality Date  . CESAREAN SECTION  2009  . CESAREAN SECTION N/A 10/07/2013   Procedure: CESAREAN SECTION;  Surgeon: Lesly Dukes, MD;  Location: [facility];  Service: Obstetrics;  Laterality: N/A;  . CESAREAN SECTION WITH BILATERAL TUBAL LIGATION N/A 08/19/2016   Procedure: CESAREAN SECTION;  Surgeon: Catalina Antigua, MD;  Location: [facility];  Service: Obstetrics;  Laterality: N/A;  . DIAGNOSTIC LAPAROSCOPY WITH REMOVAL OF ECTOPIC PREGNANCY Left 08/02/2015   Procedure: DIAGNOSTIC LAPAROSCOPY WITH REMOVAL OF ECTOPIC PREGNANCY;  Surgeon: Catalina Antigua, MD;  Location: [facility];  Service: Gynecology;  Laterality: Left;  for ectopic   . TUBAL LIGATION Bilateral 08/19/2016   Procedure: BILATERAL TUBAL LIGATION;  Surgeon: Catalina Antigua, MD;  Location: [facility];  Service: Obstetrics;  Laterality: Bilateral;    Family History  Problem Relation Age of Onset  . Diabetes Maternal Grandmother   . Hypertension Maternal Grandmother   . Heart attack Maternal Grandmother     No Known Allergies  Current Outpatient Medications on File Prior to Visit  Medication Sig Dispense Refill  . Ferrous Gluconate-C-Folic Acid (IRON-C PO) Take by mouth.    Marland Kitchen ibuprofen (ADVIL) 200 MG tablet Take 200 mg by mouth every 6 (six) hours as needed.    . NON FORMULARY Family members blood pressure medicine, started taking one a day since last thursday     No current facility-administered medications on file prior to visit.    BP 122/76    Pulse 80   Temp (!) 96.7 F (35.9 C) (Temporal)   Ht 5\' 6"  (1.676 m)   Wt 277 lb (125.6 kg)   LMP 09/19/2019   SpO2 97%   BMI 44.71 kg/m   ' Objective:   Physical Exam  Constitutional: She appears well-nourished.  Cardiovascular: Normal rate and regular rhythm.  Respiratory: Effort normal and breath sounds normal.  Musculoskeletal:     Cervical back: Neck supple.  Skin: Skin is warm and dry.  Psychiatric: She has a normal mood and affect.           Assessment & Plan:

## 2019-10-10 NOTE — Assessment & Plan Note (Signed)
Not seen on recent labs. Discussed to stop taking oral iron.

## 2019-10-10 NOTE — Assessment & Plan Note (Signed)
Treated with Amlodipine 10 mg, BP well controlled today. Suspect ankle edema is secondary to CCB. Reduce dose to 5 mg as this may still be effective enough for BP control while also reducing known side effect of edema.  We will monitor and plan to see her back in 6 weeks.

## 2019-10-10 NOTE — Patient Instructions (Signed)
Start sertraline (Zoloft) 50 mg once daily for anxiety and depression. Start with 1/2 tablet daily for 6-8 days, then increase to 1 full tablet thereafter.  You will be contacted regarding your referral to therapy.  Please let us know if you have not been contacted within two weeks.   Stop by the lab prior to leaving today. I will notify you of your results once received.   Please schedule a follow up visit for 6 weeks as discussed.   It was a pleasure to meet you today! Please don't hesitate to call or message me with any questions. Welcome to [facility]!

## 2019-10-10 NOTE — Assessment & Plan Note (Signed)
Chronic for the last year, PHQ 9 score of 21 today. Discussed options for treatment, she opts for both therapy and medication.  Referral placed for therapy. Rx for Zolfot 50 mg sent to pharmacy. Patient is to take 1/2 tablet daily for 8 days, then advance to 1 full tablet thereafter. We discussed possible side effects of headache, GI upset, drowsiness, and SI/HI. If thoughts of SI/HI develop, we discussed to present to the emergency immediately. Patient verbalized understanding.   Follow up in 6 weeks for re-evaluation.

## 2019-10-10 NOTE — Assessment & Plan Note (Signed)
Chronic for the last 1 year with GAD 7 score of 16 today. Discussed options for treatment, she opts for both therapy and medication.  Referral placed for therapy. Rx for Zolfot 50 mg sent to pharmacy. Patient is to take 1/2 tablet daily for 8 days, then advance to 1 full tablet thereafter. We discussed possible side effects of headache, GI upset, drowsiness, and SI/HI. If thoughts of SI/HI develop, we discussed to present to the emergency immediately. Patient verbalized understanding.   Follow up in 6 weeks for re-evaluation.

## 2019-10-15 LAB — C. TRACHOMATIS/N. GONORRHOEAE RNA
C. trachomatis RNA, TMA: NOT DETECTED
N. gonorrhoeae RNA, TMA: NOT DETECTED

## 2019-10-15 LAB — TRICHOMONAS VAGINALIS, PROBE AMP: Trichomonas vaginalis RNA: NOT DETECTED

## 2019-10-15 LAB — HSV 1/2 AB (IGM), IFA W/RFLX TITER
HSV 1 IgM Screen: NEGATIVE
HSV 2 IgM Screen: NEGATIVE

## 2019-10-15 LAB — HSV(HERPES SIMPLEX VRS) I + II AB-IGG
HAV 1 IGG,TYPE SPECIFIC AB: 1.57 index — ABNORMAL HIGH
HSV 2 IGG,TYPE SPECIFIC AB: 12.7 index — ABNORMAL HIGH

## 2019-10-15 LAB — RPR: RPR Ser Ql: NONREACTIVE

## 2019-10-15 LAB — HEPATITIS C ANTIBODY
Hepatitis C Ab: NONREACTIVE
SIGNAL TO CUT-OFF: 0.01 (ref <1.00)

## 2019-10-15 LAB — HIV ANTIBODY (ROUTINE TESTING W REFLEX): HIV 1&2 Ab, 4th Generation: NONREACTIVE

## 2019-10-16 ENCOUNTER — Telehealth: Payer: Self-pay | Admitting: Cardiology

## 2019-10-16 NOTE — Telephone Encounter (Signed)
a 

## 2019-10-31 ENCOUNTER — Other Ambulatory Visit: Payer: Self-pay

## 2019-10-31 ENCOUNTER — Telehealth: Payer: Self-pay

## 2019-10-31 ENCOUNTER — Encounter: Payer: Self-pay | Admitting: Emergency Medicine

## 2019-10-31 ENCOUNTER — Ambulatory Visit
Admission: EM | Admit: 2019-10-31 | Discharge: 2019-10-31 | Disposition: A | Discharge status: Home / Self Care | Payer: Medicaid Other | Attending: Family Medicine | Admitting: Family Medicine

## 2019-10-31 DIAGNOSIS — M545 Low back pain, unspecified: Secondary | ICD-10-CM

## 2019-10-31 DIAGNOSIS — S39012A Strain of muscle, fascia and tendon of lower back, initial encounter: Secondary | ICD-10-CM | POA: Diagnosis not present

## 2019-10-31 MED ORDER — NAPROXEN 500 MG PO TABS: 500.0000 mg | ORAL_TABLET | Freq: Two times a day (BID) | ORAL | 0 refills | Status: DC

## 2019-10-31 MED ORDER — METHOCARBAMOL 500 MG PO TABS: 500.0000 mg | ORAL_TABLET | Freq: Two times a day (BID) | ORAL | 0 refills | Status: DC

## 2019-10-31 NOTE — Discharge Instructions (Signed)
Take the prescribed Naproxen as needed for your pain.  Take the muscle relaxer Robaxin as needed for muscle spasm; do not drive, operate machinery, or drink alcohol with this medication as it may make you drowsy.    Follow up with an orthopedist if your pain is not improving.  Go to the emergency department if you have worsening pain or develop new symptoms such as difficulty with urination, weakness, numbness, loss of control of your bladder or bowels, fever, chills or other concerns.

## 2019-10-31 NOTE — ED Triage Notes (Signed)
Pt c/o lower back pain. She state she fell while trying to hang curtains yesterday. She states she fell from about a 3 foot high nightstand.

## 2019-10-31 NOTE — ED Provider Notes (Signed)
Joann King    CSN: [id number] Arrival date & time: 10/31/19  1640      History   Chief Complaint Chief Complaint  Patient presents with  . Fall  . Back Pain    HPI Joann King is a 29 y.o. female.   Patient reports that she was hanging curtains at home today, and that she fell backwards off the nightstand and hit her bottom on a table.  She reports that she has been having low back pain since then.  Reports that she took ibuprofen earlier today with no relief.  Incident happened earlier today.  Denies headache, radiating pain, nausea, vomiting, diarrhea, rash, fever, other symptoms.  ROS per HPI  The history is provided by the patient.  Fall  Back Pain   Past Medical History:  Diagnosis Date  . Anemia    during first pregnancy  . Back pain   . Dizziness    recurrent  . Ectopic pregnancy, tubal   . Family history of mother as victim of domestic violence 07/20/2016   Per prenatal record in 2011, 2012  . Heartburn in pregnancy   . Herpes genitalia    doesn't think ever had outbreak  . History of cesarean delivery 10/07/2013  . Pregnancy induced hypertension     Patient Active Problem List   Diagnosis Date Noted  . GAD (generalized anxiety disorder) 10/10/2019  . Moderate episode of recurrent major depressive disorder (HCC) 10/10/2019  . Screening for STD (sexually transmitted disease) 10/10/2019  . Postural dizziness with presyncope 11/04/2018  . Palpitations 11/04/2018  . Anemia 08/15/2016  . Essential hypertension 07/20/2016  . Substance abuse (HCC) 07/20/2016  . Alcohol use complicating pregnancy in third trimester 10/07/2013    Past Surgical History:  Procedure Laterality Date  . CESAREAN SECTION  2009  . CESAREAN SECTION N/A 10/07/2013   Procedure: CESAREAN SECTION;  Surgeon: Lesly Dukes, MD;  Location: [facility];  Service: Obstetrics;  Laterality: N/A;  . CESAREAN SECTION WITH BILATERAL TUBAL LIGATION N/A 08/19/2016   Procedure: CESAREAN  SECTION;  Surgeon: Catalina Antigua, MD;  Location: [facility];  Service: Obstetrics;  Laterality: N/A;  . DIAGNOSTIC LAPAROSCOPY WITH REMOVAL OF ECTOPIC PREGNANCY Left 08/02/2015   Procedure: DIAGNOSTIC LAPAROSCOPY WITH REMOVAL OF ECTOPIC PREGNANCY;  Surgeon: Catalina Antigua, MD;  Location: [facility];  Service: Gynecology;  Laterality: Left;  for ectopic   . TUBAL LIGATION Bilateral 08/19/2016   Procedure: BILATERAL TUBAL LIGATION;  Surgeon: Catalina Antigua, MD;  Location: [facility];  Service: Obstetrics;  Laterality: Bilateral;    OB History    Gravida  5   Para  3   Term  3   Preterm      AB  2   Living  3     SAB      TAB  1   Ectopic  1   Multiple  0   Live Births  3            Home Medications    Prior to Admission medications   Medication Sig Start Date End Date Taking? Authorizing Provider  amLODipine (NORVASC) 5 MG tablet Take 1 tablet (5 mg total) by mouth daily. For blood pressure. 10/10/19  Yes Doreene Nest, NP  Ferrous Gluconate-C-Folic Acid (IRON-C PO) Take by mouth.   Yes [provider]  ibuprofen (ADVIL) 200 MG tablet Take 200 mg by mouth every 6 (six) hours as needed.   Yes [provider]  NON FORMULARY Family members blood pressure medicine, started taking one a day since last thursday   Yes [provider]  methocarbamol (ROBAXIN) 500 MG tablet Take 1 tablet (500 mg total) by mouth 2 (two) times daily. 10/31/19   Faustino Congress, NP  naproxen (NAPROSYN) 500 MG tablet Take 1 tablet (500 mg total) by mouth 2 (two) times daily. 10/31/19   Faustino Congress, NP  sertraline (ZOLOFT) 50 MG tablet Take 1 tablet (50 mg total) by mouth daily. For anxiety and depression. 10/10/19   Pleas Koch, NP    Family History Family History  Problem Relation Age of Onset  . Diabetes Maternal Grandmother   . Hypertension Maternal Grandmother   . Heart attack Maternal Grandmother     Social History Social History    Tobacco Use  . Smoking status: Former Smoker    Packs/day: 0.50    Types: Cigarettes  . Smokeless tobacco: Never Used  Substance Use Topics  . Alcohol use: No  . Drug use: No     Allergies   Patient has no known allergies.   Review of Systems Review of Systems  Musculoskeletal: Positive for back pain.     Physical Exam Triage Vital Signs ED Triage Vitals  Enc Vitals Group     BP 10/31/19 1643 120/84     Pulse Rate 10/31/19 1643 83     Resp 10/31/19 1643 18     Temp 10/31/19 1643 97.7 F (36.5 C)     Temp Source 10/31/19 1643 Oral     SpO2 10/31/19 1643 98 %     Weight 10/31/19 1644 276 lb 14.4 oz (125.6 kg)     Height 10/31/19 1644 5\' 6"  (1.676 m)     Head Circumference --      Peak Flow --      Pain Score 10/31/19 1643 8     Pain Loc --      Pain Edu? --      Excl. in [place]? --    No data found.  Updated Vital Signs BP 120/84 (BP Location: Left Arm)   Pulse 83   Temp 97.7 F (36.5 C) (Oral)   Resp 18   Ht 5\' 6"  (1.676 m)   Wt 276 lb 14.4 oz (125.6 kg)   LMP 10/25/2019 (Approximate)   SpO2 98%   BMI 44.69 kg/m   Visual Acuity Right Eye Distance:   Left Eye Distance:   Bilateral Distance:    Right Eye Near:   Left Eye Near:    Bilateral Near:     Physical Exam Vitals and nursing note reviewed.  Constitutional:      General: She is not in acute distress.    Appearance: Normal appearance. She is well-developed and normal weight. She is not ill-appearing.  HENT:     Head: Normocephalic and atraumatic.  Eyes:     Conjunctiva/sclera: Conjunctivae normal.  Cardiovascular:     Rate and Rhythm: Normal rate and regular rhythm.     Heart sounds: Normal heart sounds. No murmur.  Pulmonary:     Effort: Pulmonary effort is normal. No respiratory distress.     Breath sounds: Normal breath sounds. No stridor. No wheezing, rhonchi or rales.  Chest:     Chest wall: No tenderness.  Abdominal:     General: Bowel sounds are normal.     Palpations:  Abdomen is soft.     Tenderness: There is no abdominal tenderness.  Musculoskeletal:  General: Tenderness present.     Cervical back: Normal range of motion and neck supple.     Comments: Low back tenderness bilaterally and palpation.  Skin:    General: Skin is warm and dry.     Capillary Refill: Capillary refill takes less than 2 seconds.  Neurological:     General: No focal deficit present.     Mental Status: She is alert and oriented to person, place, and time.  Psychiatric:        Mood and Affect: Mood normal.        Behavior: Behavior normal.      UC Treatments / Results  Labs (all labs ordered are listed, but only abnormal results are displayed) Labs Reviewed - No data to display  EKG   Radiology No results found.  Procedures Procedures (including critical care time)  Medications Ordered in UC Medications - No data to display  Initial Impression / Assessment and Plan / UC Course  I have reviewed the triage vital signs and the nursing notes.  Pertinent labs & imaging results that were available during my care of the patient were reviewed by me and considered in my medical decision making (see chart for details).     Back strain Acute low back pain without sciatica: Presents with low back pain for the last couple of hours.  Was trying to hang curtains and had a mechanical fall off of a 3 foot nightstand onto a table.  On exam, lungs CTA bilaterally, heart sounds normal S1-S2.  There is no rib tenderness anywhere, no abdominal tenderness.  Bilateral low back tenderness on palpation with deep palpation.  Patient able to ambulate with normal gait.  Patient appears stiff when moving from sitting to standing position.  Prescribed naproxen 500 mg twice daily as needed back pain.  Also prescribed Robaxin 500 mg twice daily as needed muscle spasms.  Instructed patient she may use icy hot as well or other topical rub.  Instructed that she may use ice or heat whichever is  most comfortable for her.  Instructed to follow-up if she is not feeling better in the next day or 2.  Work note provided for the next 3 days with modified that she may come back if she is feeling better earlier.  Patient verbalized understanding of the plan. Final Clinical Impressions(s) / UC Diagnoses   Final diagnoses:  Acute bilateral low back pain without sciatica  Back strain, initial encounter     Discharge Instructions     Take the prescribed Naproxen as needed for your pain.  Take the muscle relaxer Robaxin as needed for muscle spasm; do not drive, operate machinery, or drink alcohol with this medication as it may make you drowsy.    Follow up with an orthopedist if your pain is not improving.  Go to the emergency department if you have worsening pain or develop new symptoms such as difficulty with urination, weakness, numbness, loss of control of your bladder or bowels, fever, chills or other concerns.     ED Prescriptions    Medication Sig Dispense Auth. Provider   methocarbamol (ROBAXIN) 500 MG tablet Take 1 tablet (500 mg total) by mouth 2 (two) times daily. 20 tablet Moshe Cipro, NP   naproxen (NAPROSYN) 500 MG tablet Take 1 tablet (500 mg total) by mouth 2 (two) times daily. 30 tablet Moshe Cipro, NP     PDMP not reviewed this encounter.   Moshe Cipro, NP 10/31/19 1717

## 2019-10-31 NOTE — Telephone Encounter (Signed)
Pt called asking what to do. She was working out and may have pulled a muscle. Said she was limping. I advised we could not see her here as is was late on Friday. Gave her info on the [facility] UC at [facility] since they have Xray capabilities.

## 2019-11-13 ENCOUNTER — Ambulatory Visit: Payer: Medicaid Other | Admitting: Cardiology

## 2019-11-19 ENCOUNTER — Ambulatory Visit: Payer: Medicaid Other | Admitting: Cardiology

## 2019-11-19 NOTE — Progress Notes (Deleted)
Date:  11/19/2019   ID:  Joann King, DOB 1991/03/21, MRN [id number]  PCP:  Pleas Koch, NP  Cardiologist:  Fransico Him, MD Electrophysiologist:  None   Chief Complaint:  Presyncope  History of Present Illness:    This is a pleasant 29yo AAF with a hx of GERD and HTN during pregnancy who was seen a year ago by me for dizziness. Whenever she would stand up and starts to walk she would all of a sudden become hot and see spots in front of her eyes and had to sit down and rest.  There was no relation to meals although she was worried it was due to low blood sugar.  Labs in March 2020 were completely normal.  EKG showed normal sinus rhythm with normal intervals.   She denied any family history of sudden cardiac death.  She had not had any frank syncopal episodes.  She is estranged from her father and her mother died when the patient was 82 of an overdose.  She has 2 older sisters and a brother.  1 of her sisters has had multiple miscarriages.  She says that her symptoms of dizziness had occurred since she was pregnant in 2018 with her son.  She  had borderline hypertension while she was pregnant.  After that she started having issues with dizzy spells after she would be sitting for a period of time and go to get up.  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  She would be sitting a lot and then have to get up and check a truck and ask when she starts to feel dizzy and has to catch herself.  She has never had any syncope.    When I saw her in May 2020 it was felt that her sx were related to orthostatic hypotension and possibly POTS.  She was encouraged to stay well hydrated, use compression hose while working and liberalize sodium intake.  2D echo was normal.  A heart monitor was ordered but was not done.  She is here today for followup and is doing well.  She denies any chest pain or pressure, SOB, DOE, PND, orthopnea, LE edema, dizziness, palpitations or syncope. She is  compliant with her meds and is tolerating meds with no SE.    The patient does not have symptoms concerning for COVID-19 infection (fever, chills, cough, or new shortness of breath).   Prior CV studies:   The following studies were reviewed today:  2D echo  Past Medical History:  Diagnosis Date  . Anemia    during first pregnancy  . Back pain   . Dizziness    recurrent  . Ectopic pregnancy, tubal   . Family history of mother as victim of domestic violence 07/20/2016   Per prenatal record in 2011, 2012  . Heartburn in pregnancy   . Herpes genitalia    doesn't think ever had outbreak  . History of cesarean delivery 10/07/2013  . Pregnancy induced hypertension    Past Surgical History:  Procedure Laterality Date  . CESAREAN SECTION  2009  . CESAREAN SECTION N/A 10/07/2013   Procedure: CESAREAN SECTION;  Surgeon: Guss Bunde, MD;  Location: [place] [facility];  Service: Obstetrics;  Laterality: N/A;  . CESAREAN SECTION WITH BILATERAL TUBAL LIGATION N/A 08/19/2016   Procedure: CESAREAN SECTION;  Surgeon: Mora Bellman, MD;  Location: [place];  Service: Obstetrics;  Laterality: N/A;  . DIAGNOSTIC LAPAROSCOPY WITH REMOVAL OF ECTOPIC PREGNANCY  Left 08/02/2015   Procedure: DIAGNOSTIC LAPAROSCOPY WITH REMOVAL OF ECTOPIC PREGNANCY;  Surgeon: Catalina Antigua, MD;  Location: [facility];  Service: Gynecology;  Laterality: Left;  for ectopic   . TUBAL LIGATION Bilateral 08/19/2016   Procedure: BILATERAL TUBAL LIGATION;  Surgeon: Catalina Antigua, MD;  Location: [facility];  Service: Obstetrics;  Laterality: Bilateral;     No outpatient medications have been marked as taking for the 11/19/19 encounter (Appointment) with Quintella Reichert, MD.     Allergies:   Patient has no known allergies.   Social History   Tobacco Use  . Smoking status: Former Smoker    Packs/day: 0.50    Types: Cigarettes  . Smokeless tobacco: Never Used  Substance Use Topics  . Alcohol use: No  . Drug use: No      Family Hx: The patient's family history includes Diabetes in her maternal grandmother; Heart attack in her maternal grandmother; Hypertension in her maternal grandmother.  ROS:   Please see the history of present illness.     All other systems reviewed and are negative.   Labs/Other Tests and Data Reviewed:    Recent Labs: 09/30/2019: BUN 9; Creatinine, Ser 0.91; Hemoglobin 12.5; Platelets 468; Potassium 4.2; Sodium 139   Recent Lipid Panel No results found for: CHOL, TRIG, HDL, CHOLHDL, LDLCALC, LDLDIRECT  Wt Readings from Last 3 Encounters:  10/31/19 276 lb 14.4 oz (125.6 kg)  10/10/19 277 lb (125.6 kg)  03/04/19 230 lb (104.3 kg)     Objective:    Vital Signs:  LMP 10/25/2019 (Approximate)    GEN: Well nourished, well developed in no acute distress HEENT: Normal NECK: No JVD; No carotid bruits LYMPHATICS: No lymphadenopathy CARDIAC:RRR, no murmurs, rubs, gallops RESPIRATORY:  Clear to auscultation without rales, wheezing or rhonchi  ABDOMEN: Soft, non-tender, non-distended MUSCULOSKELETAL:  No edema; No deformity  SKIN: Warm and dry NEUROLOGIC:  Alert and oriented x 3 PSYCHIATRIC:  Normal affect    ASSESSMENT & PLAN:    1.  Dizziness  -she has not had any further dizziness episodes  2.  Palpitations -denies any further palpitations   3.  Borderline HTN - -BP controlled on exam today   Medication Adjustments/Labs and Tests Ordered: Current medicines are reviewed at length with the patient today.  Concerns regarding medicines are outlined above.  Tests Ordered: No orders of the defined types were placed in this encounter.  Medication Changes: No orders of the defined types were placed in this encounter.   Disposition:  Follow up in 4 month(s) with PA  Signed, Armanda Magic, MD  11/19/2019 8:30 AM    [facility]

## 2019-11-21 ENCOUNTER — Other Ambulatory Visit: Payer: Self-pay

## 2019-11-21 ENCOUNTER — Encounter: Payer: Self-pay | Admitting: Primary Care

## 2019-11-21 ENCOUNTER — Ambulatory Visit (INDEPENDENT_AMBULATORY_CARE_PROVIDER_SITE_OTHER): Payer: Self-pay | Admitting: Primary Care

## 2019-11-21 DIAGNOSIS — I10 Essential (primary) hypertension: Secondary | ICD-10-CM

## 2019-11-21 DIAGNOSIS — F331 Major depressive disorder, recurrent, moderate: Secondary | ICD-10-CM

## 2019-11-21 DIAGNOSIS — F411 Generalized anxiety disorder: Secondary | ICD-10-CM

## 2019-11-21 NOTE — Assessment & Plan Note (Signed)
Has not yet taken Zoloft, is considering now. She will see therapy next month.   Discussed for her to update me in 4 weeks with an update. If she's doing well we will proceed with additional refills. If no improvement then we will consider lexapro.

## 2019-11-21 NOTE — Patient Instructions (Signed)
Continue amlodipine 5 mg for blood pressure. Have your pharmacy call me when you need a refill.  Start sertraline (Zoloft) 50 mg. Take 1/2 tablet daily for 8 days, then increase to 1 full tablet thereafter.  Please update me via My Chart as discussed.   It was a pleasure to see you today!

## 2019-11-21 NOTE — Assessment & Plan Note (Signed)
Has not yet taken Zoloft, is considering now. She will see therapy next month.   Discussed for her to update me in 4 weeks with an update. If she's doing well we will proceed with additional refills. If no improvement then we will consider lexapro.  

## 2019-11-21 NOTE — Progress Notes (Signed)
Subjective:    Patient ID: Joann King, female    DOB: 04-13-1991, 29 y.o.   MRN: [id number]  HPI  This visit occurred during the SARS-CoV-2 public health emergency.  Safety protocols were in place, including screening questions prior to the visit, additional usage of staff PPE, and extensive cleaning of exam room while observing appropriate contact time as indicated for disinfecting solutions.   Joann King is a 29 year old female with a history of hypertension, postural dizziness with presyncope, substance abuse, GAD, palpitations, MDD who presents today for follow up of anxiety and depression.  She was last evaluated on 10/10/19 for chronic symptoms of anxiety and depression including sadness/feeling down, little motivation, tearfulness. GAD 7 score of 16 and PHQ 9 score of 21 so we initiated Zoloft 50 mg.   We also decreased her amlodipine to 5 mg due to ankle edema.   Since her last visit her lower extremity swelling has reduced on amlodipine 5 mg. Her blood pressure has been stable with home readings, except for last night when she made a salty meal. She never took any of the Zoloft tablets as she's afraid of the side effects. She has been contacted by therapy, appointment is scheduled for June 2021.   She is considering starting the Zoloft now as she continues to experience the same symptoms.   BP Readings from Last 3 Encounters:  11/21/19 126/84  10/31/19 120/84  10/10/19 122/76      Review of Systems  Cardiovascular: Negative for chest pain and leg swelling.  Psychiatric/Behavioral: The patient is nervous/anxious.        Sadness/feeling down, tearfulness, feeling anxious, chest tightness.        Past Medical History:  Diagnosis Date  . Anemia    during first pregnancy  . Back pain   . Dizziness    recurrent  . Ectopic pregnancy, tubal   . Family history of mother as victim of domestic violence 07/20/2016   Per prenatal record in 2011, 2012  . Heartburn in  pregnancy   . Herpes genitalia    doesn't think ever had outbreak  . History of cesarean delivery 10/07/2013  . Pregnancy induced hypertension      Social History   Socioeconomic History  . Marital status: Significant Other    Spouse name: Not on file  . Number of children: Not on file  . Years of education: Not on file  . Highest education level: Not on file  Occupational History  . Not on file  Tobacco Use  . Smoking status: Former Smoker    Packs/day: 0.50    Types: Cigarettes  . Smokeless tobacco: Never Used  Substance and Sexual Activity  . Alcohol use: No  . Drug use: No  . Sexual activity: Not on file  Other Topics Concern  . Not on file  Social History Narrative  . Not on file   Social Determinants of Health   Financial Resource Strain:   . Difficulty of Paying Living Expenses:   Food Insecurity:   . Worried About Programme researcher, broadcasting/film/video in the Last Year:   . Barista in the Last Year:   Transportation Needs:   . Freight forwarder (Medical):   Marland Kitchen Lack of Transportation (Non-Medical):   Physical Activity:   . Days of Exercise per Week:   . Minutes of Exercise per Session:   Stress:   . Feeling of Stress :   Social Connections:   .  Frequency of Communication with Friends and Family:   . Frequency of Social Gatherings with Friends and Family:   . Attends Religious Services:   . Active Member of Clubs or Organizations:   . Attends Archivist Meetings:   Marland Kitchen Marital Status:   Intimate Partner Violence:   . Fear of Current or Ex-Partner:   . Emotionally Abused:   Marland Kitchen Physically Abused:   . Sexually Abused:     Past Surgical History:  Procedure Laterality Date  . CESAREAN SECTION  2009  . CESAREAN SECTION N/A 10/07/2013   Procedure: CESAREAN SECTION;  Surgeon: Guss Bunde, MD;  Location: [place] [facility];  Service: Obstetrics;  Laterality: N/A;  . CESAREAN SECTION WITH BILATERAL TUBAL LIGATION N/A 08/19/2016   Procedure: CESAREAN SECTION;  Surgeon:  Mora Bellman, MD;  Location: [place];  Service: Obstetrics;  Laterality: N/A;  . DIAGNOSTIC LAPAROSCOPY WITH REMOVAL OF ECTOPIC PREGNANCY Left 08/02/2015   Procedure: DIAGNOSTIC LAPAROSCOPY WITH REMOVAL OF ECTOPIC PREGNANCY;  Surgeon: Mora Bellman, MD;  Location: [place] [facility];  Service: Gynecology;  Laterality: Left;  for ectopic   . TUBAL LIGATION Bilateral 08/19/2016   Procedure: BILATERAL TUBAL LIGATION;  Surgeon: Mora Bellman, MD;  Location: [place];  Service: Obstetrics;  Laterality: Bilateral;    Family History  Problem Relation Age of Onset  . Diabetes Maternal Grandmother   . Hypertension Maternal Grandmother   . Heart attack Maternal Grandmother     No Known Allergies  Current Outpatient Medications on File Prior to Visit  Medication Sig Dispense Refill  . amLODipine (NORVASC) 5 MG tablet Take 1 tablet (5 mg total) by mouth daily. For blood pressure. 90 tablet 0  . Ferrous Gluconate-C-Folic Acid (IRON-C PO) Take by mouth.    Marland Kitchen ibuprofen (ADVIL) 200 MG tablet Take 200 mg by mouth every 6 (six) hours as needed.    . NON FORMULARY Family members blood pressure medicine, started taking one a day since last thursday    . methocarbamol (ROBAXIN) 500 MG tablet Take 1 tablet (500 mg total) by mouth 2 (two) times daily. (Patient not taking: Reported on 11/21/2019) 20 tablet 0  . naproxen (NAPROSYN) 500 MG tablet Take 1 tablet (500 mg total) by mouth 2 (two) times daily. (Patient not taking: Reported on 11/21/2019) 30 tablet 0  . sertraline (ZOLOFT) 50 MG tablet Take 1 tablet (50 mg total) by mouth daily. For anxiety and depression. (Patient not taking: Reported on 11/21/2019) 30 tablet 1   No current facility-administered medications on file prior to visit.    BP 126/84   Pulse 84   Temp (!) 96.2 F (35.7 C) (Temporal)   Ht 5\' 6"  (1.676 m)   Wt 275 lb 8 oz (125 kg)   LMP 10/25/2019 (Approximate)   SpO2 97%   BMI 44.47 kg/m    Objective:   Physical Exam    Constitutional: She appears well-nourished.  Cardiovascular: Normal rate and regular rhythm.  No lower extremity edema noted  Respiratory: Breath sounds normal.  Psychiatric: She has a normal mood and affect.           Assessment & Plan:

## 2019-11-21 NOTE — Assessment & Plan Note (Signed)
Well controlled on 5 mg of amlodipine, ankle edema has resolved. Continue same. She will have pharmacy notify once refills are needed.

## 2019-11-22 ENCOUNTER — Other Ambulatory Visit: Payer: Self-pay

## 2019-11-22 ENCOUNTER — Emergency Department
Admission: EM | Admit: 2019-11-22 | Discharge: 2019-11-22 | Disposition: A | Discharge status: Home / Self Care | Payer: Medicaid Other | Attending: Emergency Medicine | Admitting: Emergency Medicine

## 2019-11-22 DIAGNOSIS — R42 Dizziness and giddiness: Secondary | ICD-10-CM | POA: Diagnosis not present

## 2019-11-22 DIAGNOSIS — R531 Weakness: Secondary | ICD-10-CM | POA: Diagnosis not present

## 2019-11-22 DIAGNOSIS — Z79899 Other long term (current) drug therapy: Secondary | ICD-10-CM | POA: Insufficient documentation

## 2019-11-22 DIAGNOSIS — Z87891 Personal history of nicotine dependence: Secondary | ICD-10-CM | POA: Diagnosis not present

## 2019-11-22 DIAGNOSIS — R55 Syncope and collapse: Secondary | ICD-10-CM | POA: Diagnosis present

## 2019-11-22 DIAGNOSIS — I10 Essential (primary) hypertension: Secondary | ICD-10-CM | POA: Diagnosis not present

## 2019-11-22 LAB — CBC
HCT: 37.6 % (ref 36.0–46.0)
Hemoglobin: 12.4 g/dL (ref 12.0–15.0)
MCH: 29.2 pg (ref 26.0–34.0)
MCHC: 33 g/dL (ref 30.0–36.0)
MCV: 88.5 fL (ref 80.0–100.0)
Platelets: 367 10*3/uL (ref 150–400)
RBC: 4.25 MIL/uL (ref 3.87–5.11)
RDW: 13.6 % (ref 11.5–15.5)
WBC: 7.4 10*3/uL (ref 4.0–10.5)
nRBC: 0 % (ref 0.0–0.2)

## 2019-11-22 LAB — BASIC METABOLIC PANEL
Anion gap: 6 (ref 5–15)
BUN: 9 mg/dL (ref 6–20)
CO2: 26 mmol/L (ref 22–32)
Calcium: 9 mg/dL (ref 8.9–10.3)
Chloride: 105 mmol/L (ref 98–111)
Creatinine, Ser: 0.93 mg/dL (ref 0.44–1.00)
GFR calc Af Amer: >60 mL/min (ref >60)
GFR calc non Af Amer: >60 mL/min (ref >60)
Glucose, Bld: 106 mg/dL — ABNORMAL HIGH (ref 70–99)
Potassium: 3.5 mmol/L (ref 3.5–5.1)
Sodium: 137 mmol/L (ref 135–145)

## 2019-11-22 LAB — POCT PREGNANCY, URINE
Preg Test, Ur: NEGATIVE
Preg Test, Ur: NEGATIVE

## 2019-11-22 MED ORDER — SODIUM CHLORIDE 0.9 % IV BOLUS
1000.0000 mL | Freq: Once | INTRAVENOUS | Status: AC
  Administered 2019-11-22: 1000 mL via INTRAVENOUS
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
## 2019-11-22 NOTE — ED Notes (Signed)
Urine pregnancy negative at this time.

## 2019-11-22 NOTE — ED Triage Notes (Signed)
Pt reports taking amlodipine last night and this morning again by mistake. Pt states she takes BP medicine once daily. Per EMS systolic BP was 94 in trendelenburg. Pt denies CP, denies dizziness, c/o mild nausea. Pt is AOx4, NAD noted. Skin is warm, dry, and pink.

## 2019-12-03 ENCOUNTER — Ambulatory Visit (INDEPENDENT_AMBULATORY_CARE_PROVIDER_SITE_OTHER): Payer: Medicaid Other | Admitting: Psychologist

## 2019-12-03 ENCOUNTER — Telehealth: Payer: Self-pay | Admitting: Cardiology

## 2019-12-03 DIAGNOSIS — F411 Generalized anxiety disorder: Secondary | ICD-10-CM

## 2019-12-03 NOTE — Telephone Encounter (Signed)
Spoke with the patient and advised her that she will no longer need a COVID screening prior to her stress test. Patient verbalized understanding. COVID screening has been cancelled.

## 2019-12-03 NOTE — Telephone Encounter (Signed)
Patient is requesting a letter for her employer stating that she is required to quarantine on dates 12/05/19-06-08/21, post COVID-19 test and prior to stress test. Please assist.

## 2019-12-05 ENCOUNTER — Other Ambulatory Visit (HOSPITAL_COMMUNITY): Payer: Medicaid Other

## 2019-12-07 ENCOUNTER — Emergency Department
Admission: EM | Admit: 2019-12-07 | Discharge: 2019-12-07 | Disposition: A | Discharge status: Home / Self Care | Payer: Medicaid Other | Attending: Emergency Medicine | Admitting: Emergency Medicine

## 2019-12-07 ENCOUNTER — Other Ambulatory Visit: Payer: Self-pay

## 2019-12-07 ENCOUNTER — Emergency Department: Payer: Medicaid Other

## 2019-12-07 ENCOUNTER — Encounter: Payer: Self-pay | Admitting: Emergency Medicine

## 2019-12-07 DIAGNOSIS — S39012A Strain of muscle, fascia and tendon of lower back, initial encounter: Secondary | ICD-10-CM

## 2019-12-07 DIAGNOSIS — W01198A Fall on same level from slipping, tripping and stumbling with subsequent striking against other object, initial encounter: Secondary | ICD-10-CM | POA: Diagnosis not present

## 2019-12-07 DIAGNOSIS — Y999 Unspecified external cause status: Secondary | ICD-10-CM | POA: Insufficient documentation

## 2019-12-07 DIAGNOSIS — Y92010 Kitchen of single-family (private) house as the place of occurrence of the external cause: Secondary | ICD-10-CM | POA: Insufficient documentation

## 2019-12-07 DIAGNOSIS — Y9341 Activity, dancing: Secondary | ICD-10-CM | POA: Diagnosis not present

## 2019-12-07 DIAGNOSIS — S3992XA Unspecified injury of lower back, initial encounter: Secondary | ICD-10-CM | POA: Diagnosis present

## 2019-12-07 DIAGNOSIS — W19XXXA Unspecified fall, initial encounter: Secondary | ICD-10-CM

## 2019-12-07 MED ORDER — CYCLOBENZAPRINE HCL 10 MG PO TABS
5.0000 mg | ORAL_TABLET | Freq: Once | ORAL | Status: AC
  Administered 2019-12-07: 5 mg via ORAL
  Filled 2019-12-07: qty 1

## 2019-12-07 MED ORDER — HYDROCODONE-ACETAMINOPHEN 5-325 MG PO TABS: 1.0000 | ORAL_TABLET | Freq: Four times a day (QID) | ORAL | 0 refills | Status: DC | PRN

## 2019-12-07 MED ORDER — IBUPROFEN 800 MG PO TABS: 800.0000 mg | ORAL_TABLET | Freq: Three times a day (TID) | ORAL | 0 refills | Status: DC | PRN

## 2019-12-07 MED ORDER — CYCLOBENZAPRINE HCL 5 MG PO TABS: ORAL_TABLET | ORAL | 0 refills | Status: DC

## 2019-12-07 MED ORDER — HYDROCODONE-ACETAMINOPHEN 5-325 MG PO TABS
1.0000 | ORAL_TABLET | Freq: Once | ORAL | Status: AC
  Administered 2019-12-07: 1 via ORAL
  Filled 2019-12-07: qty 1

## 2019-12-07 MED ORDER — IBUPROFEN 800 MG PO TABS
800.0000 mg | ORAL_TABLET | Freq: Once | ORAL | Status: AC
  Administered 2019-12-07: 800 mg via ORAL
  Filled 2019-12-07: qty 1

## 2019-12-07 NOTE — ED Triage Notes (Signed)
Pt states that she was home doing a [employer] in socks on her kitchen floor where she fell and hurt her back. Pt states that this happened Friday evening around 2000. Pt is in NAD.

## 2019-12-07 NOTE — Discharge Instructions (Signed)
1.  You may take medicines as needed for pain and muscle spasms (Motrin/Norco/Flexeril #15). 2.  You may alternate heat and ice as needed for comfort. 3.  Return to the ER for worsening symptoms, persistent vomiting, difficulty breathing or other concerns.
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
## 2019-12-07 NOTE — ED Notes (Signed)
Patient reports back pain after slipping and falling.  Patient reports happened Friday night.  Patient is able to walk without distress.

## 2019-12-09 ENCOUNTER — Ambulatory Visit (INDEPENDENT_AMBULATORY_CARE_PROVIDER_SITE_OTHER): Payer: Medicaid Other

## 2019-12-09 ENCOUNTER — Telehealth: Payer: Self-pay | Admitting: Cardiology

## 2019-12-09 ENCOUNTER — Other Ambulatory Visit: Payer: Self-pay

## 2019-12-09 DIAGNOSIS — R079 Chest pain, unspecified: Secondary | ICD-10-CM | POA: Diagnosis not present

## 2019-12-09 LAB — EXERCISE TOLERANCE TEST
Estimated workload: 7 METS
Exercise duration (min): 6 min
Exercise duration (sec): 0 s
MPHR: 191 {beats}/min
Peak HR: 166 {beats}/min
Percent HR: 86 %
RPE: 17
Rest HR: 86 {beats}/min

## 2019-12-09 NOTE — Telephone Encounter (Signed)
New message   Patient is returning call for echo results. Please call. 

## 2019-12-09 NOTE — Telephone Encounter (Signed)
Spoke with pt and advised per Dr Mayford Knife pt's echo is normal.  Pt verbalizes understanding and thanked Charity fundraiser for call.

## 2019-12-18 ENCOUNTER — Ambulatory Visit: Payer: Medicaid Other | Admitting: Psychologist

## 2020-01-29 DIAGNOSIS — H9319 Tinnitus, unspecified ear: Secondary | ICD-10-CM

## 2020-01-30 ENCOUNTER — Emergency Department: Payer: Medicaid Other

## 2020-01-30 ENCOUNTER — Other Ambulatory Visit: Payer: Self-pay

## 2020-01-30 DIAGNOSIS — Z87891 Personal history of nicotine dependence: Secondary | ICD-10-CM | POA: Diagnosis not present

## 2020-01-30 DIAGNOSIS — Z79899 Other long term (current) drug therapy: Secondary | ICD-10-CM | POA: Diagnosis not present

## 2020-01-30 DIAGNOSIS — I10 Essential (primary) hypertension: Secondary | ICD-10-CM | POA: Diagnosis not present

## 2020-01-30 DIAGNOSIS — R079 Chest pain, unspecified: Secondary | ICD-10-CM | POA: Insufficient documentation

## 2020-01-30 DIAGNOSIS — R42 Dizziness and giddiness: Secondary | ICD-10-CM | POA: Insufficient documentation

## 2020-01-30 DIAGNOSIS — R110 Nausea: Secondary | ICD-10-CM | POA: Diagnosis not present

## 2020-01-30 LAB — CBC
HCT: 36.4 % (ref 36.0–46.0)
Hemoglobin: 12 g/dL (ref 12.0–15.0)
MCH: 29.4 pg (ref 26.0–34.0)
MCHC: 33 g/dL (ref 30.0–36.0)
MCV: 89.2 fL (ref 80.0–100.0)
Platelets: 399 10*3/uL (ref 150–400)
RBC: 4.08 MIL/uL (ref 3.87–5.11)
RDW: 13.1 % (ref 11.5–15.5)
WBC: 7.6 10*3/uL (ref 4.0–10.5)
nRBC: 0 % (ref 0.0–0.2)

## 2020-01-30 LAB — BASIC METABOLIC PANEL
Anion gap: 9 (ref 5–15)
BUN: 11 mg/dL (ref 6–20)
CO2: 24 mmol/L (ref 22–32)
Calcium: 8.9 mg/dL (ref 8.9–10.3)
Chloride: 105 mmol/L (ref 98–111)
Creatinine, Ser: 1.05 mg/dL — ABNORMAL HIGH (ref 0.44–1.00)
GFR calc Af Amer: >60 mL/min (ref >60)
GFR calc non Af Amer: >60 mL/min (ref >60)
Glucose, Bld: 102 mg/dL — ABNORMAL HIGH (ref 70–99)
Potassium: 3.9 mmol/L (ref 3.5–5.1)
Sodium: 138 mmol/L (ref 135–145)

## 2020-01-30 LAB — TROPONIN I (HIGH SENSITIVITY): Troponin I (High Sensitivity): <2 ng/L (ref <18)

## 2020-01-30 MED ORDER — SODIUM CHLORIDE 0.9% FLUSH: 3.0000 mL | Freq: Once | INTRAVENOUS | Status: DC

## 2020-01-30 NOTE — ED Triage Notes (Signed)
Pt arrives via POV for reports of central chest pain and lightheadedness that started about an hour ago. Pt states she was sitting on the couch watching tv when the pain started. Denies shob. Pt in NAD, skin warm and dry.

## 2020-01-31 ENCOUNTER — Emergency Department
Admission: EM | Admit: 2020-01-31 | Discharge: 2020-01-31 | Disposition: A | Discharge status: Home / Self Care | Payer: Medicaid Other | Attending: Emergency Medicine | Admitting: Emergency Medicine

## 2020-01-31 DIAGNOSIS — R079 Chest pain, unspecified: Secondary | ICD-10-CM

## 2020-01-31 LAB — TROPONIN I (HIGH SENSITIVITY): Troponin I (High Sensitivity): 2 ng/L (ref <18)

## 2020-01-31 NOTE — ED Notes (Signed)
Visitor to bedside.  

## 2020-01-31 NOTE — Discharge Instructions (Addendum)

## 2020-01-31 NOTE — ED Notes (Signed)
Reviewed discharge instructions and follow-up care with patient. Patient verbalized understanding of all information reviewed. Patient stable, with no distress noted at this time.    
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
## 2020-02-21 ENCOUNTER — Emergency Department
Admission: EM | Admit: 2020-02-21 | Discharge: 2020-02-21 | Discharge status: Against Medical Advice | Payer: Medicaid Other

## 2020-02-21 ENCOUNTER — Other Ambulatory Visit: Payer: Self-pay

## 2020-02-21 NOTE — ED Notes (Signed)
No answer

## 2020-03-03 NOTE — Telephone Encounter (Signed)
Ashtyn, can you assist and respond?

## 2020-03-06 ENCOUNTER — Ambulatory Visit (HOSPITAL_COMMUNITY)
Admission: EM | Admit: 2020-03-06 | Discharge: 2020-03-06 | Disposition: A | Discharge status: Home / Self Care | Payer: Medicaid Other | Attending: Family Medicine | Admitting: Family Medicine

## 2020-03-06 ENCOUNTER — Other Ambulatory Visit: Payer: Self-pay

## 2020-03-06 ENCOUNTER — Encounter (HOSPITAL_COMMUNITY): Payer: Self-pay | Admitting: Gynecology

## 2020-03-06 DIAGNOSIS — N390 Urinary tract infection, site not specified: Secondary | ICD-10-CM | POA: Diagnosis not present

## 2020-03-06 DIAGNOSIS — R350 Frequency of micturition: Secondary | ICD-10-CM | POA: Insufficient documentation

## 2020-03-06 DIAGNOSIS — Z3202 Encounter for pregnancy test, result negative: Secondary | ICD-10-CM

## 2020-03-06 DIAGNOSIS — R1030 Lower abdominal pain, unspecified: Secondary | ICD-10-CM | POA: Diagnosis not present

## 2020-03-06 DIAGNOSIS — Z113 Encounter for screening for infections with a predominantly sexual mode of transmission: Secondary | ICD-10-CM | POA: Insufficient documentation

## 2020-03-06 LAB — POCT URINALYSIS DIPSTICK, ED / UC
Bilirubin Urine: NEGATIVE
Glucose, UA: NEGATIVE mg/dL
Ketones, ur: NEGATIVE mg/dL
Nitrite: NEGATIVE
Protein, ur: NEGATIVE mg/dL
Specific Gravity, Urine: 1.025 (ref 1.005–1.030)
Urobilinogen, UA: 0.2 mg/dL (ref 0.0–1.0)
pH: 7 (ref 5.0–8.0)

## 2020-03-06 LAB — POC URINE PREG, ED: Preg Test, Ur: NEGATIVE

## 2020-03-06 MED ORDER — PHENAZOPYRIDINE HCL 200 MG PO TABS: 200.0000 mg | ORAL_TABLET | Freq: Three times a day (TID) | ORAL | 0 refills | Status: DC

## 2020-03-06 NOTE — ED Triage Notes (Signed)
Patient c/o abdomen pain x 2 days.
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
## 2020-03-06 NOTE — Discharge Instructions (Addendum)
You may have a urinary tract infection.   We are going to culture your urine and will call you as soon as we have the results.   Your swab tests will be back in about 2 days. We will call you with abnormal results and treat accordingly.  Drink plenty of water, 8-10 glasses per day.   You may take pyridium as prescribed.  Follow up with your primary care provider as needed.   Go to the Emergency Department if you experience severe pain, shortness of breath, high fever, or other concerns.

## 2020-03-08 ENCOUNTER — Telehealth (HOSPITAL_COMMUNITY): Payer: Self-pay | Admitting: Emergency Medicine

## 2020-03-08 LAB — URINE CULTURE: Culture: >=100000 — AB

## 2020-03-08 MED ORDER — SULFAMETHOXAZOLE-TRIMETHOPRIM 800-160 MG PO TABS
1.0000 | ORAL_TABLET | Freq: Two times a day (BID) | ORAL | 0 refills | Status: AC
Start: 2020-03-08 — End: 2020-03-11

## 2020-03-09 LAB — CERVICOVAGINAL ANCILLARY ONLY
Bacterial Vaginitis (gardnerella): NEGATIVE
Candida Glabrata: NEGATIVE
Candida Vaginitis: POSITIVE — AB
Chlamydia: NEGATIVE
Comment: NEGATIVE
Comment: NEGATIVE
Comment: NEGATIVE
Comment: NEGATIVE
Comment: NEGATIVE
Comment: NORMAL
Neisseria Gonorrhea: NEGATIVE
Trichomonas: NEGATIVE

## 2020-03-10 ENCOUNTER — Telehealth (HOSPITAL_COMMUNITY): Payer: Self-pay | Admitting: Emergency Medicine

## 2020-03-10 MED ORDER — FLUCONAZOLE 150 MG PO TABS
150.0000 mg | ORAL_TABLET | Freq: Once | ORAL | 0 refills | Status: AC
Start: 2020-03-10 — End: 2020-03-10

## 2020-03-21 ENCOUNTER — Ambulatory Visit (HOSPITAL_COMMUNITY)
Admission: EM | Admit: 2020-03-21 | Discharge: 2020-03-21 | Disposition: A | Discharge status: Home / Self Care | Payer: Medicaid Other | Attending: Emergency Medicine | Admitting: Emergency Medicine

## 2020-03-21 ENCOUNTER — Encounter (HOSPITAL_COMMUNITY): Payer: Self-pay | Admitting: Emergency Medicine

## 2020-03-21 ENCOUNTER — Other Ambulatory Visit: Payer: Self-pay

## 2020-03-21 DIAGNOSIS — R0789 Other chest pain: Secondary | ICD-10-CM | POA: Diagnosis not present

## 2020-03-21 MED ORDER — FLUTICASONE PROPIONATE 50 MCG/ACT NA SUSP: 1.0000 | Freq: Every day | NASAL | 0 refills | Status: DC

## 2020-03-21 MED ORDER — NAPROXEN 500 MG PO TABS: 500.0000 mg | ORAL_TABLET | Freq: Two times a day (BID) | ORAL | 0 refills | Status: DC

## 2020-03-21 MED ORDER — HYDROXYZINE HCL 25 MG PO TABS
25.0000 mg | ORAL_TABLET | Freq: Four times a day (QID) | ORAL | 0 refills | Status: DC
Start: 2020-03-21 — End: 2022-06-20

## 2020-03-21 NOTE — ED Triage Notes (Signed)
Center chest pain occurred last night around midnight.  Last about an hour.  Reports dizziness at that time.   Patient did sleep after feeling c/p and dizziness.   Patient had chest pain again, but not pain right now, it is pressure.    Patient has had symptoms before and has been diagnosed with anxiety.
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
## 2020-03-21 NOTE — Discharge Instructions (Signed)
EKG normal May try hydroxyzine as needed as alternative anxiety medicine as needed-may cause drowsiness Naprosyn as alternative anti-inflammatory for any chest inflammation Urinary symptoms flaring up after eating greasy/fatty foods or after mealtimes May try over-the-counter Pepcid twice daily Follow-up if not improving or worsening

## 2020-04-03 ENCOUNTER — Encounter (HOSPITAL_BASED_OUTPATIENT_CLINIC_OR_DEPARTMENT_OTHER): Payer: Self-pay | Admitting: Emergency Medicine

## 2020-04-03 ENCOUNTER — Emergency Department (HOSPITAL_BASED_OUTPATIENT_CLINIC_OR_DEPARTMENT_OTHER)
Admission: EM | Admit: 2020-04-03 | Discharge: 2020-04-03 | Disposition: A | Discharge status: Home / Self Care | Payer: Medicaid Other | Attending: Emergency Medicine | Admitting: Emergency Medicine

## 2020-04-03 ENCOUNTER — Other Ambulatory Visit: Payer: Self-pay

## 2020-04-03 DIAGNOSIS — R0989 Other specified symptoms and signs involving the circulatory and respiratory systems: Secondary | ICD-10-CM | POA: Diagnosis not present

## 2020-04-03 DIAGNOSIS — Z79899 Other long term (current) drug therapy: Secondary | ICD-10-CM | POA: Diagnosis not present

## 2020-04-03 DIAGNOSIS — Z20822 Contact with and (suspected) exposure to covid-19: Secondary | ICD-10-CM

## 2020-04-03 DIAGNOSIS — I10 Essential (primary) hypertension: Secondary | ICD-10-CM | POA: Insufficient documentation

## 2020-04-03 LAB — RESPIRATORY PANEL BY RT PCR (FLU A&B, COVID)
Influenza A by PCR: NEGATIVE
Influenza B by PCR: NEGATIVE
SARS Coronavirus 2 by RT PCR: NEGATIVE

## 2020-04-03 MED ORDER — CETIRIZINE HCL 10 MG PO TABS
10.0000 mg | ORAL_TABLET | Freq: Every day | ORAL | 0 refills | Status: DC
Start: 2020-04-03 — End: 2022-06-20

## 2020-04-03 MED ORDER — FLUTICASONE PROPIONATE 50 MCG/ACT NA SUSP
2.0000 | Freq: Every day | NASAL | 0 refills | Status: DC
Start: 2020-04-03 — End: 2022-06-20

## 2020-04-03 NOTE — Discharge Instructions (Signed)
Covid test was negative  I suggest Zyrtec and Flonase  Return for new or worsening symptoms

## 2020-04-03 NOTE — ED Triage Notes (Signed)
She was exposed to someone who was exposed to someone who has COVID. She has an itchy throat and wants a COVID test.
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
## 2020-07-09 IMAGING — MR MR HEAD WO/W CM
11 of 12 series · 37 of 48 positions shown · IV contrast (multihance)
Comparison: None.

CLINICAL DATA: Ringing in the LEFT ear for 3 months. Asymmetric
sensorineural hearing loss.

EXAM:
MRI HEAD WITHOUT AND WITH CONTRAST
TECHNIQUE: Multiplanar, multiecho pulse sequences of the brain and surrounding
structures were obtained without and with intravenous contrast.
CONTRAST:  20mL MULTIHANCE GADOBENATE DIMEGLUMINE 529 MG/ML IV SOLN

[Series 2: T1 · sagittal · 5.0mm · 0.45mm/px · 3 of 21 slices shown (1 of 4)]
[im 1/21]
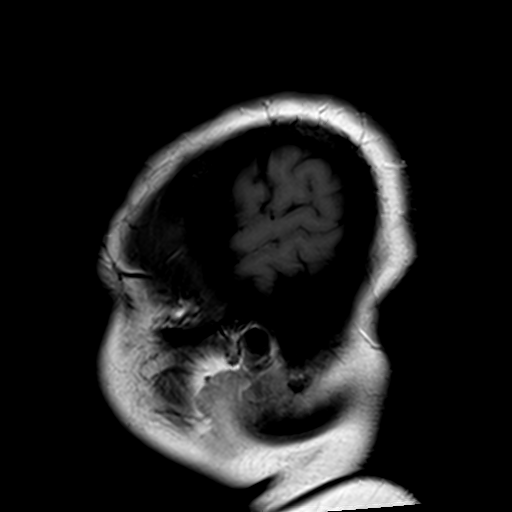
[im 11/21]
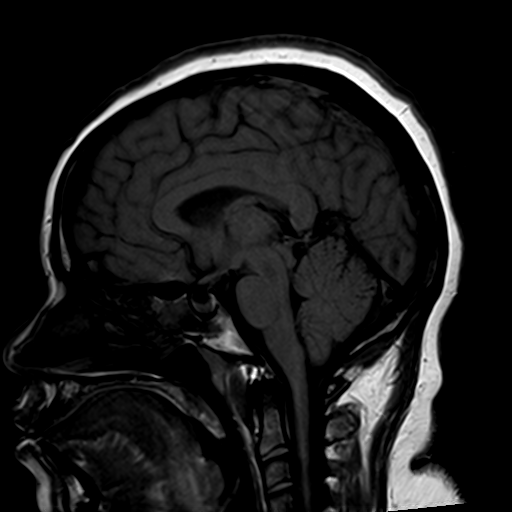
[im 21/21]
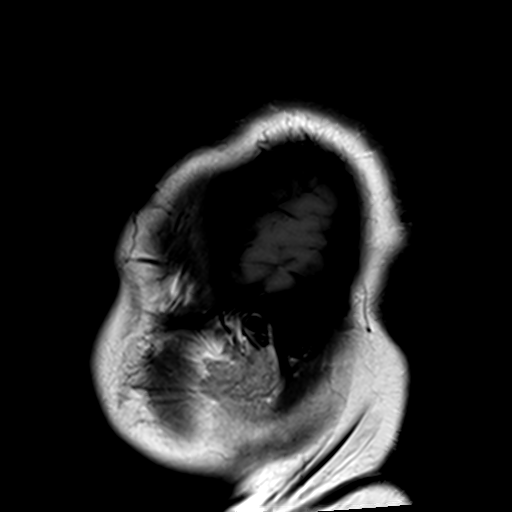

[Series 3: ep2d_diff_(id)_trace · axial · 3.0mm · 1.80mm/px · z∈[-65,+82]mm · 8 of 99 slices shown]
[im 1/99]
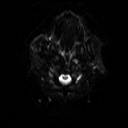
[im 20/99]
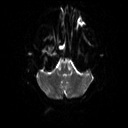
[im 30/99]
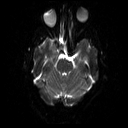
[im 40/99]
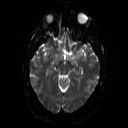
[im 59/99]
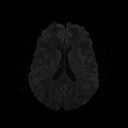
[im 69/99]
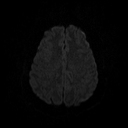
[im 79/99]
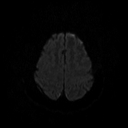
[im 99/99]
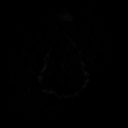

[Series 4: ep2d_diff_(id)_trace_adc · axial · 3.0mm · 1.80mm/px · z∈[-65,+82]mm · 5 of 50 slices shown]
[im 1/50]
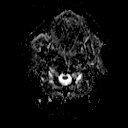
[im 13/50]
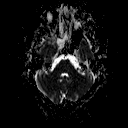
[im 25/50]
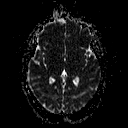
[im 37/50]
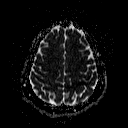
[im 50/50]
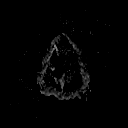

[Series 5: t2_tse_tra_512 · axial · 5.0mm · 0.72mm/px · z∈[-63,+81]mm · 2 of 24 slices shown]
[im 1/24]
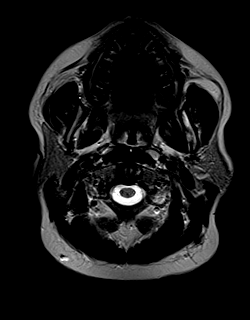
[im 24/24]
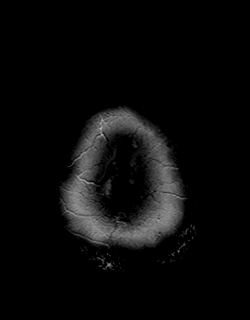

[Series 7: swi_images · axial · 2.0mm · 0.90mm/px · z∈[-70,+88]mm · 8 of 80 slices shown]
[im 1/80]
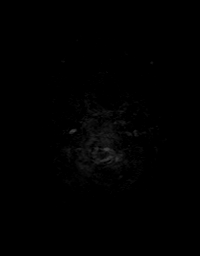
[im 12/80]
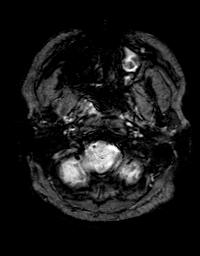
[im 23/80]
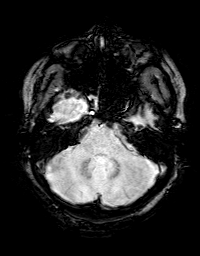
[im 34/80]
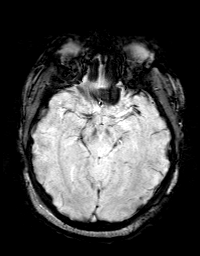
[im 46/80]
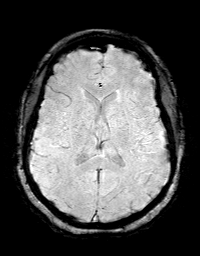
[im 57/80]
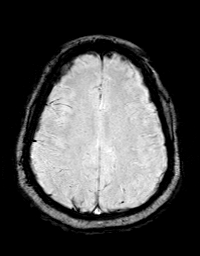
[im 68/80]
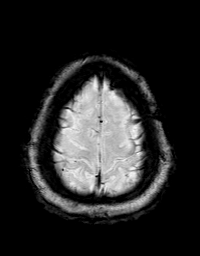
[im 80/80]
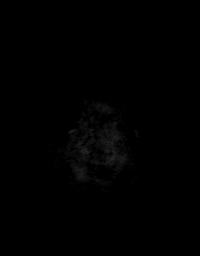

[Series 8: FLAIR · axial · 3.0mm · 0.43mm/px · z∈[-69,+86]mm · 3 of 27 slices shown]
[im 1/27]
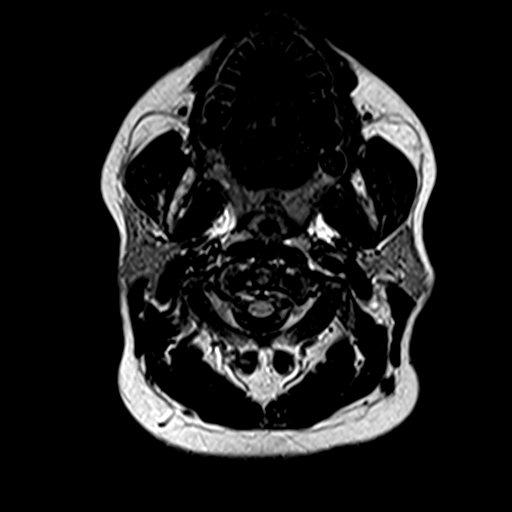
[im 14/27]
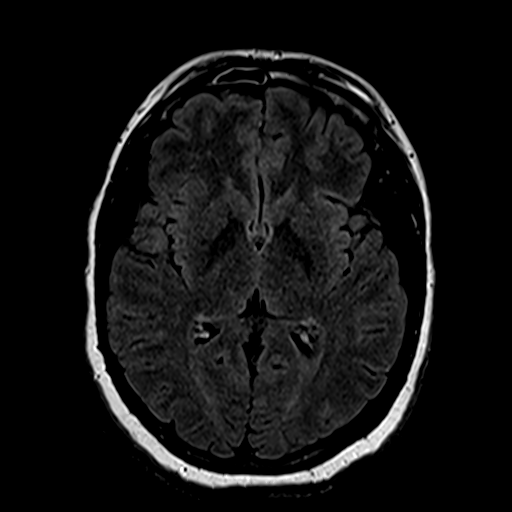
[im 27/27]
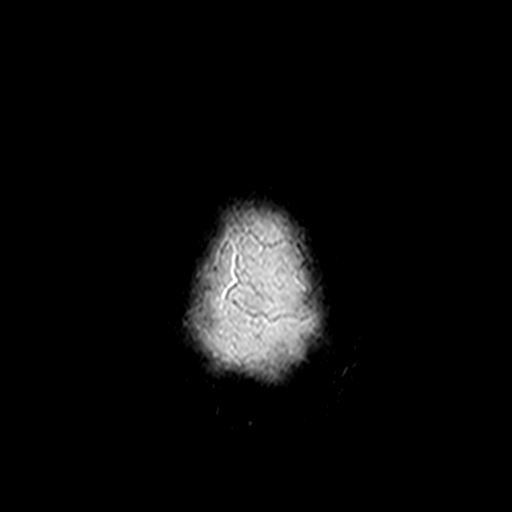

[Series 9: T1 · coronal · 3.0mm · 0.35mm/px · 1 of 13 slices shown (2 of 4)]
[im 1/13]
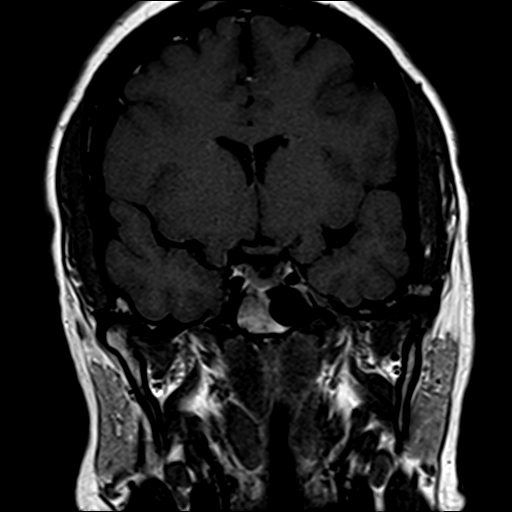

[Series 10: T1 · axial · 3.0mm · 0.35mm/px · 1 of 11 slices shown (3 of 4)]
[im 1/11]
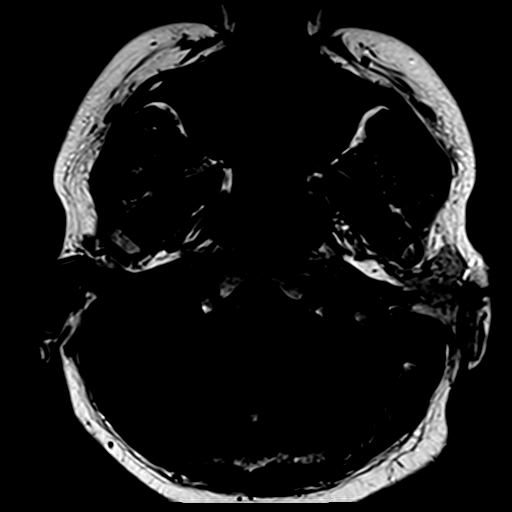

[Series 11: bSSFP · axial · 0.7mm · 0.28mm/px · z∈[-41,-11]mm · 4 of 44 slices shown]
[im 1/44]
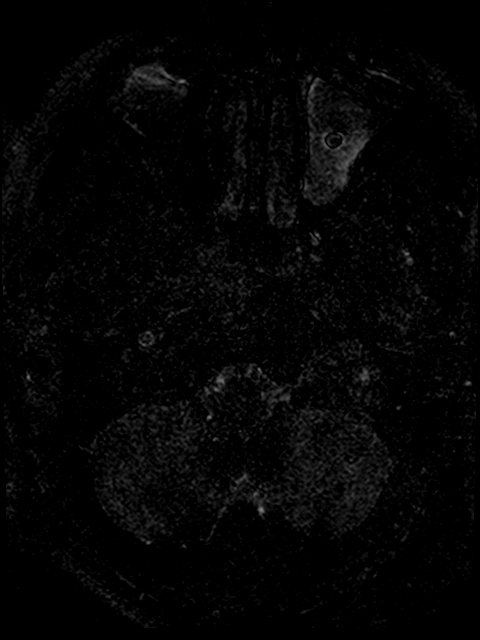
[im 15/44]
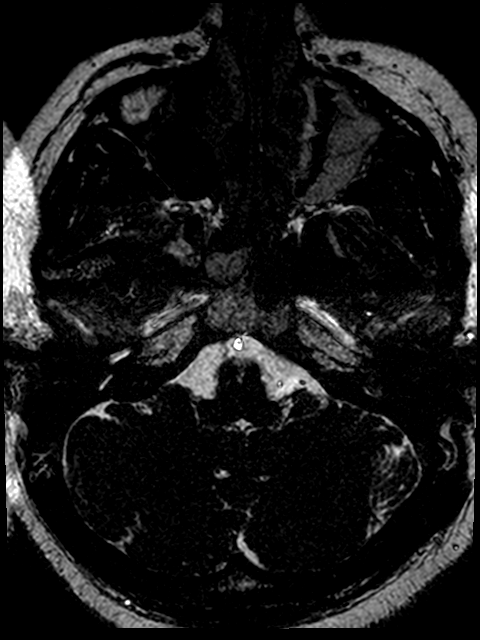
[im 29/44]
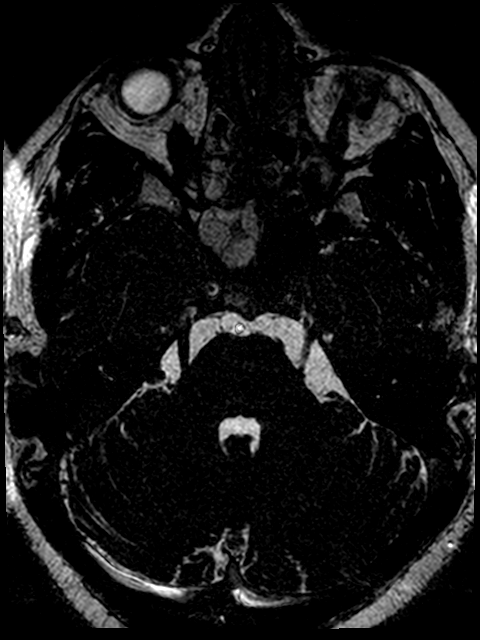
[im 44/44]
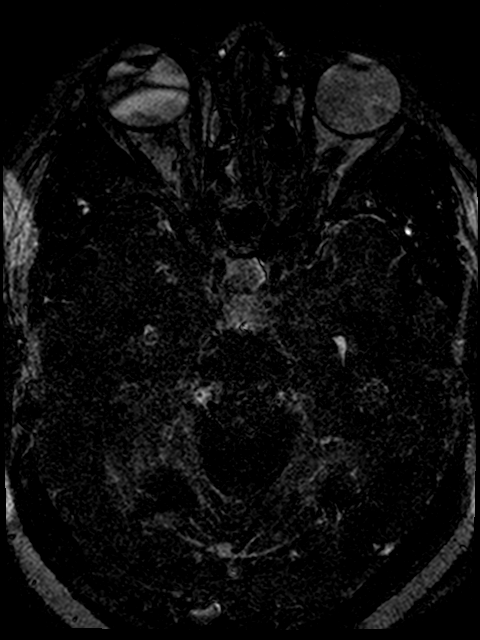

[Series 13: T1 · coronal · 3.0mm · 0.35mm/px · 1 of 13 slices shown (4 of 4)]
[im 1/13]
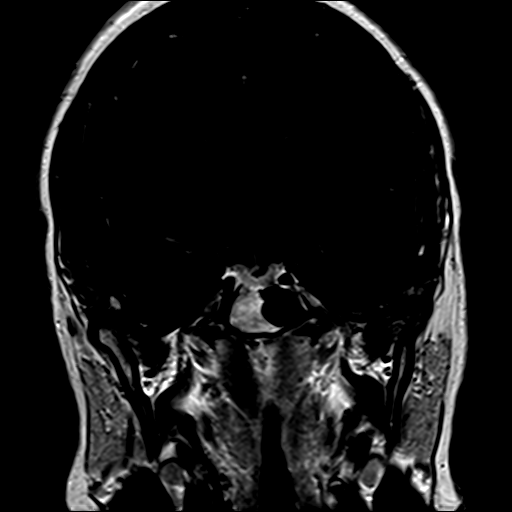

[Series 14: T1 post-contrast · axial · 3.0mm · 0.35mm/px · 1 of 11 slices shown]
[im 1/11]
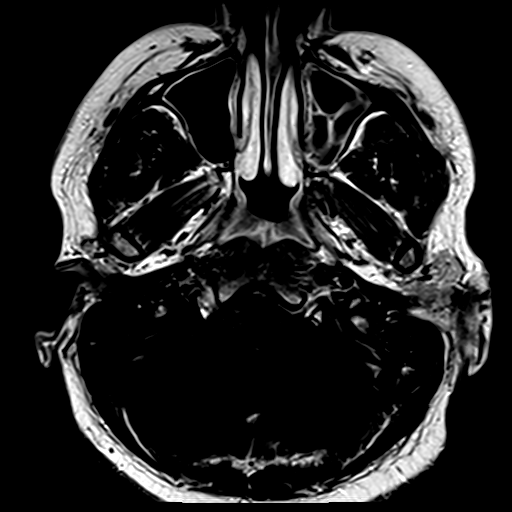

[37 of 48 positions shown; findings below may reference images not displayed]

FINDINGS: Brain: No evidence for acute infarction, hemorrhage, mass lesion,
hydrocephalus, or extra-axial fluid. Normal cerebral volume. No
white matter disease. Tiny neuroepithelial cyst, alongside the
ventricle, near the LEFT caudate, of no clinical significance.

Post infusion, no abnormal enhancement of the brain or meninges.

Thin-section imaging was performed through the posterior fossa to
evaluate the auditory pathways. There is no evidence for vestibular
schwannoma, posterior fossa mass, labyrinthine enhancement, vascular
loop, or temporal bone inflammatory process.

Vascular: Flow voids are maintained throughout the carotid, basilar,
and vertebral arteries. There are no areas of chronic hemorrhage.

Skull and upper cervical spine: Unremarkable visualized calvarium,
skullbase, and cervical vertebrae. Pituitary, pineal, cerebellar
tonsils unremarkable. No upper cervical cord lesions.

Sinuses/Orbits: Extensive chronic sinusitis, with mucosal
thickening/retention cysts affecting the LEFT greater than RIGHT
maxillary, RIGHT greater than LEFT sphenoid, BILATERAL ethmoid, and
RIGHT greater than LEFT frontal sinuses. Negative appearing orbits.

Other: Trace mastoid effusions.  Normal nasopharynx.
IMPRESSION: MRI of the brain is within normal limits.

No retrocochlear lesion is identified. No cause for the reported
symptoms is observed.

Tiny incidental neuroepithelial cyst, near the LEFT caudate.

Extensive chronic sinusitis. This is most notable in the LEFT
maxillary and RIGHT division sphenoid.

## 2020-08-19 ENCOUNTER — Emergency Department: Payer: Medicaid Other

## 2020-08-19 ENCOUNTER — Emergency Department
Admission: EM | Admit: 2020-08-19 | Discharge: 2020-08-19 | Disposition: A | Discharge status: Home / Self Care | Payer: Medicaid Other | Attending: Emergency Medicine | Admitting: Emergency Medicine

## 2020-08-19 ENCOUNTER — Other Ambulatory Visit: Payer: Self-pay

## 2020-08-19 DIAGNOSIS — M79602 Pain in left arm: Secondary | ICD-10-CM | POA: Insufficient documentation

## 2020-08-19 DIAGNOSIS — Z79899 Other long term (current) drug therapy: Secondary | ICD-10-CM | POA: Diagnosis not present

## 2020-08-19 DIAGNOSIS — I10 Essential (primary) hypertension: Secondary | ICD-10-CM | POA: Diagnosis not present

## 2020-08-19 DIAGNOSIS — R0789 Other chest pain: Secondary | ICD-10-CM | POA: Diagnosis not present

## 2020-08-19 DIAGNOSIS — Z87891 Personal history of nicotine dependence: Secondary | ICD-10-CM | POA: Diagnosis not present

## 2020-08-19 DIAGNOSIS — R079 Chest pain, unspecified: Secondary | ICD-10-CM | POA: Diagnosis present

## 2020-08-19 LAB — CBC
HCT: 36.5 % (ref 36.0–46.0)
Hemoglobin: 12.1 g/dL (ref 12.0–15.0)
MCH: 29.3 pg (ref 26.0–34.0)
MCHC: 33.2 g/dL (ref 30.0–36.0)
MCV: 88.4 fL (ref 80.0–100.0)
Platelets: 431 10*3/uL — ABNORMAL HIGH (ref 150–400)
RBC: 4.13 MIL/uL (ref 3.87–5.11)
RDW: 13.7 % (ref 11.5–15.5)
WBC: 8.5 10*3/uL (ref 4.0–10.5)
nRBC: 0 % (ref 0.0–0.2)

## 2020-08-19 LAB — BASIC METABOLIC PANEL
Anion gap: 8 (ref 5–15)
BUN: 14 mg/dL (ref 6–20)
CO2: 26 mmol/L (ref 22–32)
Calcium: 9.2 mg/dL (ref 8.9–10.3)
Chloride: 105 mmol/L (ref 98–111)
Creatinine, Ser: 0.87 mg/dL (ref 0.44–1.00)
GFR, Estimated: >60 mL/min (ref >60)
Glucose, Bld: 93 mg/dL (ref 70–99)
Potassium: 3.8 mmol/L (ref 3.5–5.1)
Sodium: 139 mmol/L (ref 135–145)

## 2020-08-19 LAB — TROPONIN I (HIGH SENSITIVITY): Troponin I (High Sensitivity): <2 ng/L (ref <18)

## 2020-08-19 NOTE — ED Notes (Signed)
Patient resting comfortably. Visitor at bedside. Patient denies questions, concerns or requests at this time. Patient states she does not need to urinate at this time, but will let this RN know when she does.
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
## 2020-08-19 NOTE — ED Notes (Signed)
Patient denies pain and is resting comfortably.  

## 2020-08-19 NOTE — ED Notes (Signed)
Patient is resting comfortably. 

## 2020-08-19 NOTE — ED Triage Notes (Signed)
Pt to ER via POV with chest pain. States the chest pain has been intermittent for a couple of months. States today it felt the worst.  Pain is left sided, stabbing, radiates down L arm. States pain is worse with deep inhalation. Denies associated shortness of breath.

## 2020-08-20 ENCOUNTER — Telehealth: Payer: Self-pay

## 2020-08-20 NOTE — Telephone Encounter (Signed)
Transition Care Management Follow-up Telephone Call  Date of discharge and from where: 08/19/2020 from [facility].  How have you been since you were released from the hospital? Pt states that she is feeling a little bit better than she was yesterday. Pt is following up with Cardiology.   Any questions or concerns? No  Items Reviewed:  Did the pt receive and understand the discharge instructions provided? Yes   Medications obtained and verified? Yes   Other? No   Any new allergies since your discharge? No   Dietary orders reviewed? N/A  Do you have support at home? Yes   Functional Questionnaire: (I = Independent and D = Dependent) ADLs: I  Bathing/Dressing- I  Meal Prep- I  Eating- I  Maintaining continence- I  Transferring/Ambulation- I  Managing Meds- I   Follow up appointments reviewed:   Specialist Hospital f/u appt confirmed? Yes  Pt calling to schedule with Cardiologist.   Are transportation arrangements needed? No  If their condition worsens, is the pt aware to call PCP or go to the Emergency Dept.? Yes Was the patient provided with contact information for the PCP's office or ED? Yes Was to pt encouraged to call back with questions or concerns? Yes

## 2020-12-25 ENCOUNTER — Encounter (HOSPITAL_COMMUNITY): Payer: Self-pay

## 2020-12-25 ENCOUNTER — Emergency Department (HOSPITAL_COMMUNITY)
Admission: EM | Admit: 2020-12-25 | Discharge: 2020-12-25 | Disposition: A | Discharge status: Home / Self Care | Payer: Medicaid Other | Attending: Emergency Medicine | Admitting: Emergency Medicine

## 2020-12-25 ENCOUNTER — Other Ambulatory Visit: Payer: Self-pay

## 2020-12-25 ENCOUNTER — Emergency Department (HOSPITAL_COMMUNITY): Payer: Medicaid Other

## 2020-12-25 DIAGNOSIS — I10 Essential (primary) hypertension: Secondary | ICD-10-CM | POA: Insufficient documentation

## 2020-12-25 DIAGNOSIS — R197 Diarrhea, unspecified: Secondary | ICD-10-CM | POA: Diagnosis not present

## 2020-12-25 DIAGNOSIS — R110 Nausea: Secondary | ICD-10-CM | POA: Insufficient documentation

## 2020-12-25 DIAGNOSIS — R1013 Epigastric pain: Secondary | ICD-10-CM | POA: Diagnosis not present

## 2020-12-25 DIAGNOSIS — R1084 Generalized abdominal pain: Secondary | ICD-10-CM | POA: Insufficient documentation

## 2020-12-25 DIAGNOSIS — Z87891 Personal history of nicotine dependence: Secondary | ICD-10-CM | POA: Insufficient documentation

## 2020-12-25 DIAGNOSIS — R072 Precordial pain: Secondary | ICD-10-CM

## 2020-12-25 DIAGNOSIS — Z79899 Other long term (current) drug therapy: Secondary | ICD-10-CM | POA: Diagnosis not present

## 2020-12-25 DIAGNOSIS — R079 Chest pain, unspecified: Secondary | ICD-10-CM | POA: Diagnosis present

## 2020-12-25 LAB — I-STAT BETA HCG BLOOD, ED (MC, WL, AP ONLY): I-stat hCG, quantitative: <5 m[IU]/mL (ref <5)

## 2020-12-25 LAB — LIPASE, BLOOD: Lipase: 31 U/L (ref 11–51)

## 2020-12-25 LAB — COMPREHENSIVE METABOLIC PANEL
ALT: 20 U/L (ref 0–44)
AST: 29 U/L (ref 15–41)
Albumin: 3.3 g/dL — ABNORMAL LOW (ref 3.5–5.0)
Alkaline Phosphatase: 67 U/L (ref 38–126)
Anion gap: 6 (ref 5–15)
BUN: 12 mg/dL (ref 6–20)
CO2: 24 mmol/L (ref 22–32)
Calcium: 9 mg/dL (ref 8.9–10.3)
Chloride: 106 mmol/L (ref 98–111)
Creatinine, Ser: 1.01 mg/dL — ABNORMAL HIGH (ref 0.44–1.00)
GFR, Estimated: >60 mL/min (ref >60)
Glucose, Bld: 94 mg/dL (ref 70–99)
Potassium: 4.7 mmol/L (ref 3.5–5.1)
Sodium: 136 mmol/L (ref 135–145)
Total Bilirubin: 0.7 mg/dL (ref 0.3–1.2)
Total Protein: 6.7 g/dL (ref 6.5–8.1)

## 2020-12-25 LAB — CBC WITH DIFFERENTIAL/PLATELET
Abs Immature Granulocytes: 0.01 10*3/uL (ref 0.00–0.07)
Basophils Absolute: 0 10*3/uL (ref 0.0–0.1)
Basophils Relative: 1 %
Eosinophils Absolute: 0.1 10*3/uL (ref 0.0–0.5)
Eosinophils Relative: 1 %
HCT: 37.4 % (ref 36.0–46.0)
Hemoglobin: 12 g/dL (ref 12.0–15.0)
Immature Granulocytes: 0 %
Lymphocytes Relative: 41 %
Lymphs Abs: 2.2 10*3/uL (ref 0.7–4.0)
MCH: 29.1 pg (ref 26.0–34.0)
MCHC: 32.1 g/dL (ref 30.0–36.0)
MCV: 90.6 fL (ref 80.0–100.0)
Monocytes Absolute: 0.2 10*3/uL (ref 0.1–1.0)
Monocytes Relative: 4 %
Neutro Abs: 2.8 10*3/uL (ref 1.7–7.7)
Neutrophils Relative %: 53 %
Platelets: 341 10*3/uL (ref 150–400)
RBC: 4.13 MIL/uL (ref 3.87–5.11)
RDW: 13.6 % (ref 11.5–15.5)
WBC: 5.3 10*3/uL (ref 4.0–10.5)
nRBC: 0 % (ref 0.0–0.2)

## 2020-12-25 LAB — TROPONIN I (HIGH SENSITIVITY)
Troponin I (High Sensitivity): 3 ng/L (ref <18)
Troponin I (High Sensitivity): <2 ng/L (ref <18)

## 2020-12-25 MED ORDER — SODIUM CHLORIDE 0.9 % IV BOLUS
1000.0000 mL | Freq: Once | INTRAVENOUS | Status: AC
  Administered 2020-12-25: 1000 mL via INTRAVENOUS

## 2020-12-25 MED ORDER — ONDANSETRON 4 MG PO TBDP: 4.0000 mg | ORAL_TABLET | Freq: Three times a day (TID) | ORAL | 0 refills | Status: DC | PRN

## 2020-12-25 MED ORDER — ONDANSETRON HCL 4 MG/2ML IJ SOLN
4.0000 mg | Freq: Once | INTRAMUSCULAR | Status: AC
  Administered 2020-12-25: 4 mg via INTRAVENOUS
  Filled 2020-12-25: qty 2

## 2020-12-25 MED ORDER — FAMOTIDINE 20 MG PO TABS: 20.0000 mg | ORAL_TABLET | Freq: Two times a day (BID) | ORAL | 0 refills | Status: DC
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
## 2020-12-25 NOTE — ED Triage Notes (Signed)
Pt arrived to ED via EMS from home w/ c/o intermittent CP onset yesterday w/ radiation to L arm. Pt slept through the night and woke up w/ CP at 0630 w/ radiation to epigastric region. Pt denies pain upon arrival to ED. EMS placed 20 L AC and gave 4mg  zofran and 324mg  ASA. Pt also reporting N/D onset today and dizziness when standing (hx of anemia). Reported CP increases w/ exertion. A&Ox4 in NAD. VSS w/ EMS

## 2020-12-25 NOTE — Discharge Instructions (Addendum)
Take the medications as prescribed.  Follow-up with Primary care provider, cardiology as needed  Return for new or worsening symptoms

## 2020-12-25 NOTE — ED Notes (Signed)
Reviewed discharge instructions with patient and support person. Follow-up care and medications reviewed. Patient and support person verbalized understanding. Patient A&Ox4, VSS, and ambulatory with steady gait upon discharge.  ?

## 2021-02-04 ENCOUNTER — Ambulatory Visit
Admission: EM | Admit: 2021-02-04 | Discharge: 2021-02-04 | Disposition: A | Discharge status: Home / Self Care | Payer: Medicaid Other | Attending: Emergency Medicine | Admitting: Emergency Medicine

## 2021-02-04 ENCOUNTER — Other Ambulatory Visit: Payer: Self-pay

## 2021-02-04 ENCOUNTER — Encounter: Payer: Self-pay | Admitting: Emergency Medicine

## 2021-02-04 DIAGNOSIS — N390 Urinary tract infection, site not specified: Secondary | ICD-10-CM | POA: Diagnosis not present

## 2021-02-04 LAB — POCT URINALYSIS DIP (MANUAL ENTRY)
Bilirubin, UA: NEGATIVE
Blood, UA: NEGATIVE
Glucose, UA: NEGATIVE mg/dL
Ketones, POC UA: NEGATIVE mg/dL
Nitrite, UA: NEGATIVE
Protein Ur, POC: NEGATIVE mg/dL
Spec Grav, UA: 1.025 (ref 1.010–1.025)
Urobilinogen, UA: 0.2 E.U./dL
pH, UA: 6.5 (ref 5.0–8.0)

## 2021-02-04 LAB — POCT URINE PREGNANCY: Preg Test, Ur: NEGATIVE

## 2021-02-04 MED ORDER — NITROFURANTOIN MONOHYD MACRO 100 MG PO CAPS: 100.0000 mg | ORAL_CAPSULE | Freq: Two times a day (BID) | ORAL | 0 refills | Status: DC

## 2021-02-04 NOTE — Discharge Instructions (Addendum)

## 2021-02-04 NOTE — ED Provider Notes (Signed)
Subjective:    Joann King is a very pleasant 30 y.o. female who presents with concerns for UTI due to urgency and lower abdominal pressure over the last 4 days.  Patient states she has taken Azo without relief.  Patient reports no itching to vaginal region, vaginal discharge, burning with urination.  Patient states that this is a similar presentation for her when she has had urinary tract infections in the past. No unilateral back pain, vomiting, fever.  Past medical history, past surgical history, current medications reviewed.  Allergies: has No Known Allergies.  Review of Systems See HPI   Objective:     Vitals:   02/04/21 0845  BP: 127/90  Pulse: 79  Resp: 17  Temp: 98.3 F (36.8 C)  SpO2: 97%     General: Appears well-developed and well-nourished. No acute distress.  Cardiovascular: Normal rate  Pulm/Chest: No respiratory distress. Neurological: Alert and oriented to person, place, and time.  Skin: Skin is warm and dry.  Psychiatric: Normal mood, affect, behavior, and thought content.  GU:  Deferred secondary to self collect specimen  Laboratory:  Orders Placed This Encounter  Procedures   Urine Culture   POCT urinalysis dipstick   POCT urine pregnancy   Results for orders placed or performed during the hospital encounter of 02/04/21  POCT urinalysis dipstick  Result Value Ref Range   Color, UA yellow yellow   Clarity, UA clear clear   Glucose, UA negative negative mg/dL   Bilirubin, UA negative negative   Ketones, POC UA negative negative mg/dL   Spec Grav, UA 5.701 7.793 - 1.025   Blood, UA negative negative   pH, UA 6.5 5.0 - 8.0   Protein Ur, POC negative negative mg/dL   Urobilinogen, UA 0.2 0.2 or 1.0 E.U./dL   Nitrite, UA Negative Negative   Leukocytes, UA Small (1+) (A) Negative  POCT urine pregnancy  Result Value Ref Range   Preg Test, Ur Negative Negative    -Urinalysis reveals small leukocytes, otherwise normal. -Urine culture  pending -Pregnancy negative Assessment:   1. Acute UTI - nitrofurantoin, macrocrystal-monohydrate, (MACROBID) 100 MG capsule; Take 1 capsule (100 mg total) by mouth 2 (two) times daily.  Dispense: 10 capsule; Refill: 0   Plan:   MDM: Patient presents with concerns for UTI due to urgency and lower abdominal pressure over the last 4 days.  Patient states she has taken Azo without relief.  Patient reports no itching to vaginal region, vaginal discharge, burning with urination.  Patient states that this is a similar presentation for her when she has had urinary tract infections in the past. No unilateral back pain, vomiting, fever.  Chart review completed.  Urinalysis reveals small leukocytes, otherwise normal.  Urine culture pending.  Pregnancy negative.  Spoke with the patient about to start or hold antibiotics for a presumed urinary tract infection.  Patient states that this is her typical presentation with a UTI.  Advised that we will send the urine for culture and start Macrobid which was Rx'd to her pharmacy today.  Advised to check her MyChart for up-to-date health information regarding her urine culture.  Did advise the patient that depending on the bacterial growth we may have to change her antibiotic at a later date.  Patient verbalized understanding and agreed with plan.  Patient stable upon discharge.  Return as needed.  No unilateral back pain, fever or vomiting concerning for complicated UTI.    Discharge Instructions      You were  seen today for an infection in the lower urinary tract. Your urine sample today was sent for a culture. The urine culture will show what type of bacteria grows and if you are on the appropriate antibiotic. If the antibiotic needs to be changed, you will receive a phone call from the follow up nurse who will give you more information. If you do not receive a call, then you are on the correct antibiotic.  Take antibiotics as directed. Finish course even if  feeling better sooner. Drink plenty of clear fluids. Over the counter "Uristat" or "Azo Standard" may help your discomfort.  This will turn your urine dark orange or red and may stain contacts (remove before using). You may experience 24 - 48 hours of continuing discomfort until medication controls the infection.  Return to clinic or go to the ER if you develop a fever, one-sided back pain, or vomiting as these are signs of a worsening infection.         Amalia Greenhouse, [place] 02/04/21 [phone]

## 2021-02-04 NOTE — ED Triage Notes (Signed)
Patient c/o urinary urgency x 4 days.   Patient denies burning with urination, vaginal discharge, foul smell, or back pain.   Patient endorses lower ABD pressure.   Patient has taken AZO with no relief of symptoms.

## 2021-02-06 LAB — URINE CULTURE: Culture: >=100000 — AB

## 2021-02-07 ENCOUNTER — Telehealth (HOSPITAL_COMMUNITY): Payer: Self-pay | Admitting: Emergency Medicine

## 2021-02-07 MED ORDER — SULFAMETHOXAZOLE-TRIMETHOPRIM 800-160 MG PO TABS: 1.0000 | ORAL_TABLET | Freq: Two times a day (BID) | ORAL | 0 refills | Status: AC

## 2021-02-14 ENCOUNTER — Other Ambulatory Visit: Payer: Self-pay

## 2021-02-14 ENCOUNTER — Emergency Department
Admission: EM | Admit: 2021-02-14 | Discharge: 2021-02-14 | Disposition: A | Discharge status: Home / Self Care | Payer: Medicaid Other | Attending: Emergency Medicine | Admitting: Emergency Medicine

## 2021-02-14 ENCOUNTER — Emergency Department: Payer: Medicaid Other

## 2021-02-14 DIAGNOSIS — Z79899 Other long term (current) drug therapy: Secondary | ICD-10-CM | POA: Diagnosis not present

## 2021-02-14 DIAGNOSIS — R519 Headache, unspecified: Secondary | ICD-10-CM | POA: Diagnosis not present

## 2021-02-14 DIAGNOSIS — R42 Dizziness and giddiness: Secondary | ICD-10-CM | POA: Diagnosis present

## 2021-02-14 DIAGNOSIS — R0789 Other chest pain: Secondary | ICD-10-CM | POA: Insufficient documentation

## 2021-02-14 DIAGNOSIS — Z87891 Personal history of nicotine dependence: Secondary | ICD-10-CM | POA: Insufficient documentation

## 2021-02-14 DIAGNOSIS — I10 Essential (primary) hypertension: Secondary | ICD-10-CM | POA: Diagnosis not present

## 2021-02-14 LAB — HCG, QUANTITATIVE, PREGNANCY: hCG, Beta Chain, Quant, S: <1 m[IU]/mL (ref <5)

## 2021-02-14 LAB — BASIC METABOLIC PANEL
Anion gap: 9 (ref 5–15)
BUN: 11 mg/dL (ref 6–20)
CO2: 21 mmol/L — ABNORMAL LOW (ref 22–32)
Calcium: 8.8 mg/dL — ABNORMAL LOW (ref 8.9–10.3)
Chloride: 107 mmol/L (ref 98–111)
Creatinine, Ser: 0.92 mg/dL (ref 0.44–1.00)
GFR, Estimated: >60 mL/min (ref >60)
Glucose, Bld: 101 mg/dL — ABNORMAL HIGH (ref 70–99)
Potassium: 3.8 mmol/L (ref 3.5–5.1)
Sodium: 137 mmol/L (ref 135–145)

## 2021-02-14 LAB — CBC
HCT: 36.2 % (ref 36.0–46.0)
Hemoglobin: 12.1 g/dL (ref 12.0–15.0)
MCH: 29.4 pg (ref 26.0–34.0)
MCHC: 33.4 g/dL (ref 30.0–36.0)
MCV: 87.9 fL (ref 80.0–100.0)
Platelets: 377 10*3/uL (ref 150–400)
RBC: 4.12 MIL/uL (ref 3.87–5.11)
RDW: 13.8 % (ref 11.5–15.5)
WBC: 7.2 10*3/uL (ref 4.0–10.5)
nRBC: 0 % (ref 0.0–0.2)

## 2021-02-14 LAB — TROPONIN I (HIGH SENSITIVITY): Troponin I (High Sensitivity): <2 ng/L (ref <18)

## 2021-02-14 MED ORDER — ACETAMINOPHEN 500 MG PO TABS
1000.0000 mg | ORAL_TABLET | Freq: Once | ORAL | Status: AC
  Administered 2021-02-14: 1000 mg via ORAL
  Filled 2021-02-14: qty 2

## 2021-02-14 NOTE — Discharge Instructions (Addendum)
Please take Tylenol and ibuprofen/Advil for your pain.  It is safe to take them together, or to alternate them every few hours.  Take up to 1000mg of Tylenol at a time, up to 4 times per day.  Do not take more than 4000 mg of Tylenol in 24 hours.  For ibuprofen, take 400-600 mg, 4-5 times per day. ° ° °

## 2021-02-14 NOTE — ED Triage Notes (Signed)
Pt here with CP and HA that started about 20 mins ago. Pt states pain is central and does not radiate. Pt denies N/V?D.
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
## 2021-03-04 ENCOUNTER — Ambulatory Visit
Admission: EM | Admit: 2021-03-04 | Discharge: 2021-03-04 | Disposition: A | Discharge status: Home / Self Care | Payer: Medicaid Other | Attending: Emergency Medicine | Admitting: Emergency Medicine

## 2021-03-04 ENCOUNTER — Encounter: Payer: Self-pay | Admitting: Emergency Medicine

## 2021-03-04 ENCOUNTER — Other Ambulatory Visit: Payer: Self-pay

## 2021-03-04 DIAGNOSIS — Z1152 Encounter for screening for COVID-19: Secondary | ICD-10-CM | POA: Diagnosis not present

## 2021-03-04 DIAGNOSIS — B349 Viral infection, unspecified: Secondary | ICD-10-CM

## 2021-03-04 DIAGNOSIS — Z3202 Encounter for pregnancy test, result negative: Secondary | ICD-10-CM

## 2021-03-04 HISTORY — DX: Disorder of thyroid, unspecified: E07.9

## 2021-03-04 HISTORY — DX: Unspecified convulsions: R56.9

## 2021-03-04 LAB — POCT URINE PREGNANCY: Preg Test, Ur: NEGATIVE

## 2021-03-04 NOTE — Discharge Instructions (Addendum)
We will call you with any positive results from your COVID-19 testing completed in clinic today.  If you do not receive a phone call from [place] within the next 2-3 days, check your MyChart for up-to-date health information related to testing completed in clinic today.  Your pregnancy test is negative today.  For most people this is a self-limiting process and can take anywhere from 7 - 10 days to start feeling better. Pay special attention to handwashing as this can help prevent the spread of the virus.   Always read the labels of cough and cold medications as they may contain some of the ingredients below.  Rest, push lots of fluids (especially water), and utilize supportive care for symptoms. You may take acetaminophen (Tylenol) every 4-6 hours and ibuprofen every 6-8 hours for muscle pain, joint pain, headaches (you may also alternate these medications). Saline nasal sprays or rinses can also help nasal congestion (use bottled or sterile water). Warm tea with lemon and honey can sooth sore throat and cough, as can cough drops.   Return to clinic for high fever not improving with medications, chest pain, difficulty breathing, non-stop vomiting, or coughing blood. Follow-up with your primary care provider if symptoms do not improve as expected in the next 5-7 days.
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
## 2021-03-04 NOTE — ED Triage Notes (Signed)
Pt said starting this afternoon she began to feel nauseated with lightheadedness. Pt said some fatigue and feeling very tired. No fevers, chills.

## 2021-03-05 LAB — SARS-COV-2, NAA 2 DAY TAT

## 2021-03-05 LAB — NOVEL CORONAVIRUS, NAA: SARS-CoV-2, NAA: NOT DETECTED

## 2021-05-19 ENCOUNTER — Other Ambulatory Visit: Payer: Self-pay

## 2021-05-19 ENCOUNTER — Emergency Department
Admission: EM | Admit: 2021-05-19 | Discharge: 2021-05-19 | Disposition: A | Discharge status: Home / Self Care | Payer: Medicaid Other | Attending: Emergency Medicine | Admitting: Emergency Medicine

## 2021-05-19 ENCOUNTER — Telehealth: Payer: Self-pay

## 2021-05-19 DIAGNOSIS — I10 Essential (primary) hypertension: Secondary | ICD-10-CM | POA: Insufficient documentation

## 2021-05-19 DIAGNOSIS — Z87891 Personal history of nicotine dependence: Secondary | ICD-10-CM | POA: Diagnosis not present

## 2021-05-19 DIAGNOSIS — R0789 Other chest pain: Secondary | ICD-10-CM | POA: Insufficient documentation

## 2021-05-19 DIAGNOSIS — Z79899 Other long term (current) drug therapy: Secondary | ICD-10-CM | POA: Diagnosis not present

## 2021-05-19 LAB — COMPREHENSIVE METABOLIC PANEL
ALT: 17 U/L (ref 0–44)
AST: 20 U/L (ref 15–41)
Albumin: 3.6 g/dL (ref 3.5–5.0)
Alkaline Phosphatase: 86 U/L (ref 38–126)
Anion gap: 4 — ABNORMAL LOW (ref 5–15)
BUN: 12 mg/dL (ref 6–20)
CO2: 24 mmol/L (ref 22–32)
Calcium: 8.7 mg/dL — ABNORMAL LOW (ref 8.9–10.3)
Chloride: 109 mmol/L (ref 98–111)
Creatinine, Ser: 0.8 mg/dL (ref 0.44–1.00)
GFR, Estimated: >60 mL/min (ref >60)
Glucose, Bld: 100 mg/dL — ABNORMAL HIGH (ref 70–99)
Potassium: 3.6 mmol/L (ref 3.5–5.1)
Sodium: 137 mmol/L (ref 135–145)
Total Bilirubin: 0.6 mg/dL (ref 0.3–1.2)
Total Protein: 7.6 g/dL (ref 6.5–8.1)

## 2021-05-19 LAB — CBC
HCT: 35.3 % — ABNORMAL LOW (ref 36.0–46.0)
Hemoglobin: 11.6 g/dL — ABNORMAL LOW (ref 12.0–15.0)
MCH: 28.9 pg (ref 26.0–34.0)
MCHC: 32.9 g/dL (ref 30.0–36.0)
MCV: 88 fL (ref 80.0–100.0)
Platelets: 411 10*3/uL — ABNORMAL HIGH (ref 150–400)
RBC: 4.01 MIL/uL (ref 3.87–5.11)
RDW: 13.2 % (ref 11.5–15.5)
WBC: 7.3 10*3/uL (ref 4.0–10.5)
nRBC: 0 % (ref 0.0–0.2)

## 2021-05-19 LAB — TROPONIN I (HIGH SENSITIVITY): Troponin I (High Sensitivity): 3 ng/L (ref <18)

## 2021-05-19 MED ORDER — PANTOPRAZOLE SODIUM 40 MG PO TBEC: 40.0000 mg | DELAYED_RELEASE_TABLET | Freq: Every day | ORAL | 1 refills | Status: DC

## 2021-05-19 MED ORDER — PANTOPRAZOLE SODIUM 40 MG PO TBEC
80.0000 mg | DELAYED_RELEASE_TABLET | Freq: Once | ORAL | Status: AC
  Administered 2021-05-19: 19:00:00 80 mg via ORAL
  Filled 2021-05-19: qty 2

## 2021-05-19 NOTE — Telephone Encounter (Signed)
Noted, can we get patient in for follow up? She's using the ED a lot, maybe we can help!

## 2021-05-19 NOTE — Telephone Encounter (Signed)
Per chart review tab pt is already at [facility] ED. Sending note to Allayne Gitelman NP and [facility] CMA.

## 2021-05-19 NOTE — Telephone Encounter (Signed)
[facility] - Client TELEPHONE ADVICE RECORD Access[employer]urse Patient Name: Joann King Joann King Gender: Female DOB: 1991-05-29 Age: 30 Y 1 D Return Phone Number: [phone] (Primary) Address: City/ State/ Zip: [place] [address] Client [facility] - Client Client Site [facility] - Day Provider Vernona Rieger - NP Contact Type Call Who Is Calling Patient / Member / Family / Caregiver Call Type Triage / Clinical Relationship To Patient Self Return Phone Number [phone] (Primary) Chief Complaint CHEST PAIN - pain, pressure, heaviness or tightness Reason for Call Symptomatic / Request for Health Information Initial Comment Caller states she is having chest pain for a long time. Pain goes down both arms. Translation No Nurse Assessment Nurse: Ladona Ridgel, RN, Felicia Date/Time (Eastern Time): 05/19/2021 3:03:29 PM Confirm and document reason for call. If symptomatic, describe symptoms. ---Pt has had chest pain a long time and it goes down her arms. This has been going on for years and a heart MD last year said everything was ok. Does the patient have any new or worsening symptoms? ---Yes Will a triage be completed? ---Yes Related visit to physician within the last 2 weeks? ---No Does the PT have any chronic conditions? (i.e. diabetes, asthma, this includes High risk factors for pregnancy, etc.) ---No Is the patient pregnant or possibly pregnant? (Ask all females between the ages of 31-55) ---No Is this a behavioral health or substance abuse call? ---No Guidelines Guideline Title Affirmed Question Affirmed Notes Nurse Date/Time (Eastern Time) Chest Pain [1] Chest pain lasts > 5 minutes AND [2] age > 30 AND [3] one or more cardiac risk factors (e.g., diabetes, high blood pressure, high cholesterol, smoker, or strong family Ladona Ridgel, RN, Joann King 05/19/2021 3:06:29 PM PLEASE NOTE: All timestamps  contained within this report are represented as [place] Standard Time. CONFIDENTIALTY NOTICE: This fax transmission is intended only for the addressee. It contains information that is legally privileged, confidential or otherwise protected from use or disclosure. If you are not the intended recipient, you are strictly prohibited from reviewing, disclosing, copying using or disseminating any of this information or taking any action in reliance on or regarding this information. If you have received this fax in error, please notify us immediately by telephone so that we can arrange for its return to [place]. Phone: [phone], Toll-Free: [phone], Fax: [phone] Page: 2 of 2 Call Id: [id number] Guidelines Guideline Title Affirmed Question Affirmed Notes Nurse Date/Time Lamount Cohen Time) history of heart disease) Disp. Time Lamount Cohen Time) Disposition Final User 05/19/2021 3:01:31 PM Send to Urgent Isac Caddy 05/19/2021 3:09:34 PM 911 Outcome Documentation Gaddy, RN, Joann King Reason: she declined to call 911 but is going to be driven to the ER. 05/19/2021 3:08:18 PM Call EMS 911 Now Yes Gaddy, RN, Joann King Caller Disagree/Comply Comply Caller Understands Yes PreDisposition InappropriateToAsk Care Advice Given Per Guideline CALL EMS 911 NOW: * Immediate medical attention is needed. You need to hang up and call 911 (or an ambulance). * Triager Discretion: I'll call you back in a few minutes to be sure you were able to reach them
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
## 2021-05-19 NOTE — ED Triage Notes (Signed)
Pt in with co chest pain states has been going on for years. Has seen cardiologist and pmd with all normal results.

## 2021-05-19 NOTE — ED Notes (Addendum)
Pt to ED c/o CP. Pt states she has had CP that has occurred for years, and has a fear of something being wrong. Pt states she had a sharp pain in the middle of her chest, and that is what made her come in. Pt denies CP and SOB at this time.   Pt has been dx with anxiety and panic disorder, states she has not taken her med due to the way it makes her feel.   Pt A&Ox4, NAD

## 2021-05-23 NOTE — Telephone Encounter (Signed)
Have called patient and made follow up for next week. She is aware of GO location. No further question at this time.

## 2021-05-24 NOTE — Telephone Encounter (Signed)
Noted  

## 2021-05-31 ENCOUNTER — Ambulatory Visit: Payer: Medicaid Other | Admitting: Primary Care

## 2021-06-07 ENCOUNTER — Ambulatory Visit: Payer: Medicaid Other | Admitting: Primary Care

## 2021-06-11 ENCOUNTER — Other Ambulatory Visit: Payer: Self-pay

## 2021-06-11 ENCOUNTER — Encounter: Payer: Self-pay | Admitting: Emergency Medicine

## 2021-06-11 DIAGNOSIS — R21 Rash and other nonspecific skin eruption: Secondary | ICD-10-CM | POA: Diagnosis present

## 2021-06-11 DIAGNOSIS — Z87891 Personal history of nicotine dependence: Secondary | ICD-10-CM | POA: Diagnosis not present

## 2021-06-11 DIAGNOSIS — Z79899 Other long term (current) drug therapy: Secondary | ICD-10-CM | POA: Diagnosis not present

## 2021-06-11 DIAGNOSIS — I10 Essential (primary) hypertension: Secondary | ICD-10-CM | POA: Insufficient documentation

## 2021-06-11 DIAGNOSIS — L662 Folliculitis decalvans: Secondary | ICD-10-CM | POA: Insufficient documentation

## 2021-06-11 LAB — CBC WITH DIFFERENTIAL/PLATELET
Abs Immature Granulocytes: 0.03 10*3/uL (ref 0.00–0.07)
Basophils Absolute: 0 10*3/uL (ref 0.0–0.1)
Basophils Relative: 0 %
Eosinophils Absolute: 0.1 10*3/uL (ref 0.0–0.5)
Eosinophils Relative: 1 %
HCT: 36.6 % (ref 36.0–46.0)
Hemoglobin: 11.9 g/dL — ABNORMAL LOW (ref 12.0–15.0)
Immature Granulocytes: 0 %
Lymphocytes Relative: 37 %
Lymphs Abs: 3.5 10*3/uL (ref 0.7–4.0)
MCH: 29.2 pg (ref 26.0–34.0)
MCHC: 32.5 g/dL (ref 30.0–36.0)
MCV: 89.7 fL (ref 80.0–100.0)
Monocytes Absolute: 0.5 10*3/uL (ref 0.1–1.0)
Monocytes Relative: 5 %
Neutro Abs: 5.5 10*3/uL (ref 1.7–7.7)
Neutrophils Relative %: 57 %
Platelets: 415 10*3/uL — ABNORMAL HIGH (ref 150–400)
RBC: 4.08 MIL/uL (ref 3.87–5.11)
RDW: 13.7 % (ref 11.5–15.5)
WBC: 9.6 10*3/uL (ref 4.0–10.5)
nRBC: 0 % (ref 0.0–0.2)

## 2021-06-11 NOTE — ED Triage Notes (Addendum)
Pt to ED via POV, pt states was bitten by an insect at some point, unsure of when. Pt with small bump noted to R side of her neck and redness. Pt states pain increasing with turning of her neck. Pt states pain 7/10 at this time. Pt with noted R side swelling to neck and is tender with palpation, able to maintain secretions with out difficulty, speak in full and complete sentences without difficulty.

## 2021-06-12 ENCOUNTER — Emergency Department
Admission: EM | Admit: 2021-06-12 | Discharge: 2021-06-12 | Disposition: A | Discharge status: Home / Self Care | Payer: Medicaid Other | Attending: Emergency Medicine | Admitting: Emergency Medicine

## 2021-06-12 DIAGNOSIS — L739 Follicular disorder, unspecified: Secondary | ICD-10-CM

## 2021-06-12 LAB — COMPREHENSIVE METABOLIC PANEL
ALT: 18 U/L (ref 0–44)
AST: 23 U/L (ref 15–41)
Albumin: 4.1 g/dL (ref 3.5–5.0)
Alkaline Phosphatase: 87 U/L (ref 38–126)
Anion gap: 7 (ref 5–15)
BUN: 10 mg/dL (ref 6–20)
CO2: 23 mmol/L (ref 22–32)
Calcium: 9 mg/dL (ref 8.9–10.3)
Chloride: 105 mmol/L (ref 98–111)
Creatinine, Ser: 0.79 mg/dL (ref 0.44–1.00)
GFR, Estimated: >60 mL/min (ref >60)
Glucose, Bld: 89 mg/dL (ref 70–99)
Potassium: 3.4 mmol/L — ABNORMAL LOW (ref 3.5–5.1)
Sodium: 135 mmol/L (ref 135–145)
Total Bilirubin: 0.6 mg/dL (ref 0.3–1.2)
Total Protein: 8.2 g/dL — ABNORMAL HIGH (ref 6.5–8.1)

## 2021-06-12 LAB — POC URINE PREG, ED: Preg Test, Ur: NEGATIVE

## 2021-06-12 MED ORDER — DOXYCYCLINE HYCLATE 100 MG PO TABS
100.0000 mg | ORAL_TABLET | Freq: Once | ORAL | Status: AC
  Administered 2021-06-12: 100 mg via ORAL
  Filled 2021-06-12: qty 1

## 2021-06-12 MED ORDER — ONDANSETRON 4 MG PO TBDP: 4.0000 mg | ORAL_TABLET | Freq: Four times a day (QID) | ORAL | 0 refills | Status: DC | PRN

## 2021-06-12 MED ORDER — DOXYCYCLINE HYCLATE 100 MG PO CAPS: 100.0000 mg | ORAL_CAPSULE | Freq: Two times a day (BID) | ORAL | 0 refills | Status: DC

## 2021-06-12 MED ORDER — ONDANSETRON 4 MG PO TBDP
4.0000 mg | ORAL_TABLET | Freq: Once | ORAL | Status: AC
  Administered 2021-06-12: 4 mg via ORAL
  Filled 2021-06-12: qty 1
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
## 2021-06-12 NOTE — ED Notes (Signed)
Pt given cup and asked to provide urine sample.

## 2021-08-02 ENCOUNTER — Other Ambulatory Visit: Payer: Self-pay

## 2021-08-02 ENCOUNTER — Ambulatory Visit
Admission: EM | Admit: 2021-08-02 | Discharge: 2021-08-02 | Disposition: A | Discharge status: Home / Self Care | Payer: Medicaid Other | Attending: Student | Admitting: Student

## 2021-08-02 ENCOUNTER — Encounter: Payer: Self-pay | Admitting: Emergency Medicine

## 2021-08-02 DIAGNOSIS — Z113 Encounter for screening for infections with a predominantly sexual mode of transmission: Secondary | ICD-10-CM | POA: Diagnosis not present

## 2021-08-02 DIAGNOSIS — N898 Other specified noninflammatory disorders of vagina: Secondary | ICD-10-CM | POA: Insufficient documentation

## 2021-08-02 DIAGNOSIS — Z3202 Encounter for pregnancy test, result negative: Secondary | ICD-10-CM | POA: Insufficient documentation

## 2021-08-02 LAB — POCT URINALYSIS DIP (MANUAL ENTRY)
Bilirubin, UA: NEGATIVE
Blood, UA: NEGATIVE
Glucose, UA: NEGATIVE mg/dL
Ketones, POC UA: NEGATIVE mg/dL
Leukocytes, UA: NEGATIVE
Nitrite, UA: NEGATIVE
Protein Ur, POC: NEGATIVE mg/dL
Spec Grav, UA: 1.025 (ref 1.010–1.025)
Urobilinogen, UA: 0.2 E.U./dL
pH, UA: 6 (ref 5.0–8.0)

## 2021-08-02 LAB — POCT URINE PREGNANCY: Preg Test, Ur: NEGATIVE

## 2021-08-02 MED ORDER — CEFTRIAXONE SODIUM 500 MG IJ SOLR
500.0000 mg | Freq: Once | INTRAMUSCULAR | Status: AC
  Administered 2021-08-02: 500 mg via INTRAMUSCULAR

## 2021-08-02 MED ORDER — DOXYCYCLINE HYCLATE 100 MG PO CAPS: 100.0000 mg | ORAL_CAPSULE | Freq: Two times a day (BID) | ORAL | 0 refills | Status: AC
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
## 2021-08-02 NOTE — Discharge Instructions (Addendum)
-  We treated for gonorrhea with a shot of Rocephin today -For chlamydia - Doxycycline twice daily for 7 days.  Make sure to wear sunscreen while spending time outside while on this medication as it can increase your chance of sunburn. You can take this medication with food if you have a sensitive stomach. -We have sent testing for sexually transmitted infections. We will notify you of any positive results once they are received. If required, we will prescribe any medications you might need. Please refrain from all sexual activity until treatment is complete.  -Seek additional medical attention if you develop fevers/chills, new/worsening abdominal pain, new/worsening vaginal discomfort/discharge, etc.

## 2021-08-02 NOTE — ED Triage Notes (Signed)
Pt presents with vaginal odor and discharge x 1 week. Pt would like STD testing.

## 2021-08-03 ENCOUNTER — Telehealth (HOSPITAL_COMMUNITY): Payer: Self-pay | Admitting: Emergency Medicine

## 2021-08-03 LAB — CERVICOVAGINAL ANCILLARY ONLY
Bacterial Vaginitis (gardnerella): POSITIVE — AB
Candida Glabrata: NEGATIVE
Candida Vaginitis: POSITIVE — AB
Chlamydia: NEGATIVE
Comment: NEGATIVE
Comment: NEGATIVE
Comment: NEGATIVE
Comment: NEGATIVE
Comment: NEGATIVE
Comment: NORMAL
Neisseria Gonorrhea: NEGATIVE
Trichomonas: NEGATIVE

## 2021-08-03 MED ORDER — METRONIDAZOLE 500 MG PO TABS: 500.0000 mg | ORAL_TABLET | Freq: Two times a day (BID) | ORAL | 0 refills | Status: DC

## 2021-08-03 MED ORDER — FLUCONAZOLE 150 MG PO TABS: 150.0000 mg | ORAL_TABLET | Freq: Once | ORAL | 0 refills | Status: AC

## 2021-08-18 ENCOUNTER — Emergency Department
Admission: EM | Admit: 2021-08-18 | Discharge: 2021-08-18 | Disposition: A | Discharge status: Home / Self Care | Payer: Medicaid Other | Attending: Emergency Medicine | Admitting: Emergency Medicine

## 2021-08-18 ENCOUNTER — Other Ambulatory Visit: Payer: Self-pay

## 2021-08-18 DIAGNOSIS — I10 Essential (primary) hypertension: Secondary | ICD-10-CM | POA: Diagnosis not present

## 2021-08-18 DIAGNOSIS — J029 Acute pharyngitis, unspecified: Secondary | ICD-10-CM | POA: Diagnosis present

## 2021-08-18 DIAGNOSIS — Z20822 Contact with and (suspected) exposure to covid-19: Secondary | ICD-10-CM | POA: Diagnosis not present

## 2021-08-18 DIAGNOSIS — J039 Acute tonsillitis, unspecified: Secondary | ICD-10-CM | POA: Diagnosis not present

## 2021-08-18 LAB — RESP PANEL BY RT-PCR (FLU A&B, COVID) ARPGX2
Influenza A by PCR: NEGATIVE
Influenza B by PCR: NEGATIVE
SARS Coronavirus 2 by RT PCR: NEGATIVE

## 2021-08-18 LAB — CHLAMYDIA/NGC RT PCR (ARMC ONLY)
Chlamydia Tr: NOT DETECTED
N gonorrhoeae: NOT DETECTED

## 2021-08-18 LAB — GROUP A STREP BY PCR: Group A Strep by PCR: NOT DETECTED

## 2021-08-18 MED ORDER — DOXYCYCLINE MONOHYDRATE 100 MG PO TABS: 100.0000 mg | ORAL_TABLET | Freq: Two times a day (BID) | ORAL | 0 refills | Status: DC

## 2021-08-18 MED ORDER — CEFTRIAXONE SODIUM 1 G IJ SOLR
500.0000 mg | Freq: Once | INTRAMUSCULAR | Status: AC
  Administered 2021-08-18: 500 mg via INTRAMUSCULAR
  Filled 2021-08-18: qty 10

## 2021-08-18 MED ORDER — FLUCONAZOLE 150 MG PO TABS
ORAL_TABLET | ORAL | 0 refills | Status: DC
Start: 2021-08-18 — End: 2021-10-04

## 2021-08-18 MED ORDER — AZITHROMYCIN 500 MG PO TABS
1000.0000 mg | ORAL_TABLET | Freq: Once | ORAL | Status: AC
  Administered 2021-08-18: 1000 mg via ORAL
  Filled 2021-08-18: qty 2

## 2021-08-18 NOTE — ED Provider Notes (Signed)
[facility] Provider Note    Event Date/Time   First MD Initiated Contact with Patient 08/18/21 1234     (approximate)   History   Sore Throat   HPI  Joann King is a 31 y.o. female with history of hypertension, anemia, and thyroid disease presents to the emergency department with sore throat.  Patient states sore throat started yesterday.  No fever or chills.  And today she feels very tired.  Patient states that several people in her workplace have been sick.  Denies cough/congestion, abdominal pain, nausea/vomiting      Physical Exam   Triage Vital Signs: ED Triage Vitals  Enc Vitals Group     BP 08/18/21 1223 124/67     Pulse Rate 08/18/21 1223 81     Resp 08/18/21 1223 20     Temp 08/18/21 1223 98.7 F (37.1 C)     Temp src --      SpO2 08/18/21 1223 100 %     Weight --      Height --      Head Circumference --      Peak Flow --      Pain Score 08/18/21 1222 3     Pain Loc --      Pain Edu? --      Excl. in GC? --     Most recent vital signs: Vitals:   08/18/21 1223  BP: 124/67  Pulse: 81  Resp: 20  Temp: 98.7 F (37.1 C)  SpO2: 100%     General: Awake, no distress.   CV:  Good peripheral perfusion. regular rate and  rhythm Resp:  Normal effort. Lungs CTA Abd:  No distention.   Other:  Throat with exudate noted posteriorly, tonsils are inflamed and red, neck is supple, no lymphadenopathy noted along cervical chain   ED Results / Procedures / Treatments   Labs (all labs ordered are listed, but only abnormal results are displayed) Labs Reviewed  RESP PANEL BY RT-PCR (FLU A&B, COVID) ARPGX2  GROUP A STREP BY PCR  CHLAMYDIA/NGC RT PCR ([facility] ONLY)               EKG     RADIOLOGY     PROCEDURES:   Procedures   MEDICATIONS ORDERED IN ED: Medications  cefTRIAXone (ROCEPHIN) injection 500 mg (500 mg Intramuscular Given 08/18/21 1439)  azithromycin (ZITHROMAX) tablet 1,000 mg (1,000 mg Oral Given 08/18/21  1441)     IMPRESSION / MDM / ASSESSMENT AND PLAN / ED COURSE  I reviewed the triage vital signs and the nursing notes.                              Differential diagnosis includes, but is not limited to, tonsillitis, strep throat, mono, COVID  Strep test and COVID test ordered  Strep test is negative, respiratory panel is negative.  Patient had concerns for STD couple weeks ago and was treated with Rocephin.  She did not follow-up with doxycycline.  Patient had done a self swab at the urgent care.  Due to the concerns of STD we did do a GC/chlamydia swab of her throat.  This is returned as negative.  Patient has been treated with Zithromax 1 g p.o. and Rocephin 500 mg IM.  She is also given a prescription for doxycycline and Diflucan for yeast if needed.  She is to return the emergency department if worsening.  A work note was given.  She is to follow-up with ENT if not improving in 3 to 4 days.  Patient is in agreement treatment plan.  She is discharged stable condition.         FINAL CLINICAL IMPRESSION(S) / ED DIAGNOSES   Final diagnoses:  Acute tonsillitis, unspecified etiology     Rx / DC Orders   ED Discharge Orders          Ordered    doxycycline (ADOXA) 100 MG tablet  2 times daily        08/18/21 1425    fluconazole (DIFLUCAN) 150 MG tablet        08/18/21 1425             Note:  This document was prepared using Dragon voice recognition software and may include unintentional dictation errors.    Faythe Ghee, PA-C 08/18/21 1758    Gilles Chiquito, MD 08/18/21 [phone]

## 2021-08-18 NOTE — ED Triage Notes (Signed)
Pt comes with c/o sore throat fever and fatigue for few days.

## 2021-08-18 NOTE — Discharge Instructions (Signed)
Your STD testing will return in 2 to 3 hours.  He can see your results on either [facility] MyChart and I will try to call you this afternoon.  Take the doxycycline regardless of whether the test is positive or not.  Gargle with warm salt water.  Return the emergency department if you are having difficulty swallowing or or drooling.  See your regular doctor if not improving in 3 days.

## 2021-08-20 ENCOUNTER — Emergency Department
Admission: EM | Admit: 2021-08-20 | Discharge: 2021-08-20 | Disposition: A | Discharge status: Home / Self Care | Payer: Medicaid Other | Attending: Emergency Medicine | Admitting: Emergency Medicine

## 2021-08-20 ENCOUNTER — Other Ambulatory Visit: Payer: Self-pay

## 2021-08-20 DIAGNOSIS — J029 Acute pharyngitis, unspecified: Secondary | ICD-10-CM | POA: Diagnosis not present

## 2021-08-20 LAB — COMPREHENSIVE METABOLIC PANEL
ALT: 20 U/L (ref 0–44)
AST: 22 U/L (ref 15–41)
Albumin: 3.9 g/dL (ref 3.5–5.0)
Alkaline Phosphatase: 90 U/L (ref 38–126)
Anion gap: 7 (ref 5–15)
BUN: 12 mg/dL (ref 6–20)
CO2: 26 mmol/L (ref 22–32)
Calcium: 9 mg/dL (ref 8.9–10.3)
Chloride: 101 mmol/L (ref 98–111)
Creatinine, Ser: 0.83 mg/dL (ref 0.44–1.00)
GFR, Estimated: >60 mL/min (ref >60)
Glucose, Bld: 93 mg/dL (ref 70–99)
Potassium: 3.6 mmol/L (ref 3.5–5.1)
Sodium: 134 mmol/L — ABNORMAL LOW (ref 135–145)
Total Bilirubin: 0.5 mg/dL (ref 0.3–1.2)
Total Protein: 7.9 g/dL (ref 6.5–8.1)

## 2021-08-20 LAB — CBC WITH DIFFERENTIAL/PLATELET
Abs Immature Granulocytes: 0.02 10*3/uL (ref 0.00–0.07)
Basophils Absolute: 0.1 10*3/uL (ref 0.0–0.1)
Basophils Relative: 1 %
Eosinophils Absolute: 0.2 10*3/uL (ref 0.0–0.5)
Eosinophils Relative: 3 %
HCT: 38.6 % (ref 36.0–46.0)
Hemoglobin: 12 g/dL (ref 12.0–15.0)
Immature Granulocytes: 0 %
Lymphocytes Relative: 24 %
Lymphs Abs: 2 10*3/uL (ref 0.7–4.0)
MCH: 28.1 pg (ref 26.0–34.0)
MCHC: 31.1 g/dL (ref 30.0–36.0)
MCV: 90.4 fL (ref 80.0–100.0)
Monocytes Absolute: 0.4 10*3/uL (ref 0.1–1.0)
Monocytes Relative: 5 %
Neutro Abs: 5.6 10*3/uL (ref 1.7–7.7)
Neutrophils Relative %: 67 %
Platelets: 439 10*3/uL — ABNORMAL HIGH (ref 150–400)
RBC: 4.27 MIL/uL (ref 3.87–5.11)
RDW: 13.7 % (ref 11.5–15.5)
WBC: 8.2 10*3/uL (ref 4.0–10.5)
nRBC: 0 % (ref 0.0–0.2)

## 2021-08-20 LAB — MONONUCLEOSIS SCREEN: Mono Screen: NEGATIVE

## 2021-08-20 LAB — GROUP A STREP BY PCR: Group A Strep by PCR: NOT DETECTED

## 2021-08-20 NOTE — ED Notes (Signed)
Pt discharge information reviewed. Pt understands need for follow up care and when to return if symptoms worsen. All questions answered. Pt is alert and oriented with even and regular respirations. Pt is seen ambulating out of department with string steady gait.   

## 2021-08-20 NOTE — ED Provider Notes (Signed)
[facility] Provider Note  Patient Contact: 8:48 PM (approximate)   History   Sore Throat   HPI  Joann King is a 31 y.o. female presents to the emergency department with persistent pharyngitis.  Patient was evaluated on 216 and had a negative strep, COVID-19 and influenza test.  Patient states that her pharyngitis has improved some but is not completely gone.  She reports that she recently had unprotected sex with a new partner and is concerned that she might have HIV.  She is able to manage her own secretions and can speak in complete sentences.      Physical Exam   Triage Vital Signs: ED Triage Vitals  Enc Vitals Group     BP 08/20/21 1802 120/70     Pulse Rate 08/20/21 1802 92     Resp 08/20/21 1802 18     Temp 08/20/21 1802 98.4 F (36.9 C)     Temp Source 08/20/21 1802 Oral     SpO2 08/20/21 1802 100 %     Weight 08/20/21 1801 265 lb (120.2 kg)     Height 08/20/21 1801 5\' 6"  (1.676 m)     Head Circumference --      Peak Flow --      Pain Score 08/20/21 1801 1     Pain Loc --      Pain Edu? --      Excl. in GC? --     Most recent vital signs: Vitals:   08/20/21 1802  BP: 120/70  Pulse: 92  Resp: 18  Temp: 98.4 F (36.9 C)  SpO2: 100%     General: Alert and in no acute distress. Eyes:  PERRL. EOMI. Head: No acute traumatic findings ENT:      Ears:       Nose: No congestion/rhinnorhea.      Mouth/Throat: Mucous membranes are moist.  Posterior pharynx is erythematous with tonsillar hypertrophy and exudate bilaterally. Neck: No stridor. No cervical spine tenderness to palpation. Hematological/Lymphatic/Immunilogical: Palpable cervical lymphadenopathy.  Cardiovascular:  Good peripheral perfusion Respiratory: Normal respiratory effort without tachypnea or retractions. Lungs CTAB. Good air entry to the bases with no decreased or absent breath sounds. Gastrointestinal: Bowel sounds 4 quadrants. Soft and nontender to palpation.  No guarding or rigidity. No palpable masses. No distention. No CVA tenderness. Musculoskeletal: Full range of motion to all extremities.  Neurologic:  No gross focal neurologic deficits are appreciated.  Skin:   No rash noted Other:   ED Results / Procedures / Treatments   Labs (all labs ordered are listed, but only abnormal results are displayed) Labs Reviewed  CBC WITH DIFFERENTIAL/PLATELET - Abnormal; Notable for the following components:      Result Value   Platelets 439 (*)    All other components within normal limits  COMPREHENSIVE METABOLIC PANEL - Abnormal; Notable for the following components:   Sodium 134 (*)    All other components within normal limits  GROUP A STREP BY PCR  MONONUCLEOSIS SCREEN  HIV ANTIBODY (ROUTINE TESTING W REFLEX)         PROCEDURES:  Critical Care performed: No  Procedures   MEDICATIONS ORDERED IN ED: Medications - No data to display   IMPRESSION / MDM / ASSESSMENT AND PLAN / ED COURSE  I reviewed the triage vital signs and the nursing notes.  Differential diagnosis includes, but is not limited to, pharyngitis, mono, group A strep...  Assessment and plan: Pharyngitis:  31 year old female presents to the emergency department with persistent pharyngitis and request for HIV testing.  Vital signs are reassuring at triage.  On physical exam, patient was alert, active and nontoxic-appearing and was maintaining her own secretions.    Patient tested negative for group A strep and mono.  CBC and CMP within reference range.  HIV test in process at this time.  Recommended rest and hydration at home.  All patient questions were answered.   FINAL CLINICAL IMPRESSION(S) / ED DIAGNOSES   Final diagnoses:  Pharyngitis, unspecified etiology     Rx / DC Orders   ED Discharge Orders     None        Note:  This document was prepared using Dragon voice recognition software and may include unintentional  dictation errors.   Pia Mau Elkin, PA-C 08/20/21 2222    Dionne Bucy, MD 08/20/21 2332

## 2021-08-20 NOTE — ED Triage Notes (Signed)
Pt states she was seen here a few days ago for a sore throat and she got swabs but no blood and pt would like to have blood work done "just to make sure"- pt states pain in throat is better than when she was here last but not gone away

## 2021-08-21 LAB — HIV ANTIBODY (ROUTINE TESTING W REFLEX): HIV Screen 4th Generation wRfx: NONREACTIVE

## 2021-09-15 ENCOUNTER — Other Ambulatory Visit: Payer: Self-pay

## 2021-09-15 ENCOUNTER — Emergency Department: Payer: Medicaid Other

## 2021-09-15 ENCOUNTER — Emergency Department
Admission: EM | Admit: 2021-09-15 | Discharge: 2021-09-15 | Disposition: A | Discharge status: Home / Self Care | Payer: Medicaid Other | Attending: Emergency Medicine | Admitting: Emergency Medicine

## 2021-09-15 DIAGNOSIS — N200 Calculus of kidney: Secondary | ICD-10-CM | POA: Diagnosis not present

## 2021-09-15 DIAGNOSIS — K219 Gastro-esophageal reflux disease without esophagitis: Secondary | ICD-10-CM | POA: Insufficient documentation

## 2021-09-15 DIAGNOSIS — K5790 Diverticulosis of intestine, part unspecified, without perforation or abscess without bleeding: Secondary | ICD-10-CM | POA: Insufficient documentation

## 2021-09-15 DIAGNOSIS — I10 Essential (primary) hypertension: Secondary | ICD-10-CM | POA: Insufficient documentation

## 2021-09-15 DIAGNOSIS — R1012 Left upper quadrant pain: Secondary | ICD-10-CM | POA: Diagnosis present

## 2021-09-15 LAB — COMPREHENSIVE METABOLIC PANEL
ALT: 20 U/L (ref 0–44)
AST: 26 U/L (ref 15–41)
Albumin: 3.6 g/dL (ref 3.5–5.0)
Alkaline Phosphatase: 76 U/L (ref 38–126)
Anion gap: 8 (ref 5–15)
BUN: 9 mg/dL (ref 6–20)
CO2: 26 mmol/L (ref 22–32)
Calcium: 9.1 mg/dL (ref 8.9–10.3)
Chloride: 105 mmol/L (ref 98–111)
Creatinine, Ser: 0.86 mg/dL (ref 0.44–1.00)
GFR, Estimated: >60 mL/min (ref >60)
Glucose, Bld: 93 mg/dL (ref 70–99)
Potassium: 3.8 mmol/L (ref 3.5–5.1)
Sodium: 139 mmol/L (ref 135–145)
Total Bilirubin: 0.7 mg/dL (ref 0.3–1.2)
Total Protein: 7.7 g/dL (ref 6.5–8.1)

## 2021-09-15 LAB — CBC
HCT: 37.3 % (ref 36.0–46.0)
Hemoglobin: 11.9 g/dL — ABNORMAL LOW (ref 12.0–15.0)
MCH: 28.7 pg (ref 26.0–34.0)
MCHC: 31.9 g/dL (ref 30.0–36.0)
MCV: 90.1 fL (ref 80.0–100.0)
Platelets: 386 10*3/uL (ref 150–400)
RBC: 4.14 MIL/uL (ref 3.87–5.11)
RDW: 13.6 % (ref 11.5–15.5)
WBC: 5.6 10*3/uL (ref 4.0–10.5)
nRBC: 0 % (ref 0.0–0.2)

## 2021-09-15 LAB — POC URINE PREG, ED: Preg Test, Ur: NEGATIVE

## 2021-09-15 LAB — URINALYSIS, ROUTINE W REFLEX MICROSCOPIC
Bilirubin Urine: NEGATIVE
Glucose, UA: NEGATIVE mg/dL
Ketones, ur: NEGATIVE mg/dL
Nitrite: NEGATIVE
Protein, ur: NEGATIVE mg/dL
Specific Gravity, Urine: 1.02 (ref 1.005–1.030)
pH: 6 (ref 5.0–8.0)

## 2021-09-15 LAB — LIPASE, BLOOD: Lipase: 30 U/L (ref 11–51)

## 2021-09-15 MED ORDER — PANTOPRAZOLE SODIUM 40 MG IV SOLR
40.0000 mg | Freq: Once | INTRAVENOUS | Status: AC
  Administered 2021-09-15: 40 mg via INTRAVENOUS
  Filled 2021-09-15: qty 10

## 2021-09-15 MED ORDER — ONDANSETRON HCL 4 MG/2ML IJ SOLN
4.0000 mg | Freq: Once | INTRAMUSCULAR | Status: DC
  Filled 2021-09-15: qty 2

## 2021-09-15 MED ORDER — IOHEXOL 300 MG/ML  SOLN
100.0000 mL | Freq: Once | INTRAMUSCULAR | Status: AC | PRN
  Administered 2021-09-15: 100 mL via INTRAVENOUS

## 2021-09-15 MED ORDER — PANTOPRAZOLE SODIUM 40 MG PO TBEC: 40.0000 mg | DELAYED_RELEASE_TABLET | Freq: Every day | ORAL | 1 refills | Status: DC

## 2021-09-15 MED ORDER — ONDANSETRON 4 MG PO TBDP: 4.0000 mg | ORAL_TABLET | Freq: Three times a day (TID) | ORAL | 0 refills | Status: DC | PRN

## 2021-09-15 NOTE — Discharge Instructions (Addendum)
Follow-up with your primary provider or GI medicine as needed.  Take the prescription medicine as prescribed ?

## 2021-09-15 NOTE — ED Notes (Signed)
Pt denies nausea & does not want the zofran at this time. ?

## 2021-09-15 NOTE — ED Triage Notes (Signed)
Pt comes with c/o LUQ pain and heart burn for few days. Pt states it is always worse after she eats. Pt states 5/10 pain. Pt denies any N/V. ?

## 2021-09-15 NOTE — ED Provider Notes (Signed)
? ? ?[facility] ?Emergency Department Provider Note ? ? ? ? Event Date/Time  ? First MD Initiated Contact with Patient 09/15/21 1011   ?  (approximate) ? ? ?History  ? ?LUQ pain ? ? ?HPI ? ?Drishya YAMIKA CIFARELLI is a 31 y.o. female with medical history that includes anemia, hypertension without treatment, seizures, and thyroid disease, but denied any current medication management, presents with left upper quadrant abdominal pain.  Patient reports several days of heartburn just after she eats over the last week.  She describes the pain as 5 out of 10 in nature, and reports some associated nausea without vomiting.  She denies any bowel changes, fevers, or chills.  Patient notes that drinking water does not aggravate her symptoms.  She previously on some proton pump inhibitors but denies any current medications at this time.  She is scheduled to see a primary provider at the end of the month for probable referral to GI medicine. ?  ? ? ?Physical Exam  ? ?Triage Vital Signs: ?ED Triage Vitals  ?Enc Vitals Group  ?   BP 09/15/21 0957 126/68  ?   Pulse Rate 09/15/21 0957 71  ?   Resp 09/15/21 0957 18  ?   Temp 09/15/21 0957 97.8 ?F (36.6 ?C)  ?   Temp Source 09/15/21 0957 Oral  ?   SpO2 09/15/21 0957 100 %  ?   Weight --   ?   Height --   ?   Head Circumference --   ?   Peak Flow --   ?   Pain Score 09/15/21 0954 6  ?   Pain Loc --   ?   Pain Edu? --   ?   Excl. in [place]? --   ? ? ?Most recent vital signs: ?Vitals:  ? 09/15/21 0957  ?BP: 126/68  ?Pulse: 71  ?Resp: 18  ?Temp: 97.8 ?F (36.6 ?C)  ?SpO2: 100%  ? ? ?General Awake, no distress.  ?CV:  Good peripheral perfusion.  ?RESP:  Normal effort. CTA ?ABD:  No distention. Soft, mildly tender to LUQ. No rebound, guarding, or rigidity. Normal bowel sounds noted.  ? ?ED Results / Procedures / Treatments  ? ?Labs ?(all labs ordered are listed, but only abnormal results are displayed) ?Labs Reviewed  ?CBC - Abnormal; Notable for the following components:  ?     Result Value  ? Hemoglobin 11.9 (*)   ? All other components within normal limits  ?URINALYSIS, ROUTINE W REFLEX MICROSCOPIC - Abnormal; Notable for the following components:  ? Color, Urine YELLOW (*)   ? APPearance HAZY (*)   ? Hgb urine dipstick MODERATE (*)   ? Leukocytes,Ua TRACE (*)   ? Bacteria, UA RARE (*)   ? All other components within normal limits  ?LIPASE, BLOOD  ?COMPREHENSIVE METABOLIC PANEL  ?POC URINE PREG, ED  ? ? ? ?EKG ? ? ?RADIOLOGY ? ?I personally viewed and evaluated these images as part of my medical decision making, as well as reviewing the written report by the radiologist. ? ?ED Provider Interpretation: no acute findings} ? ?CT ABDOMEN PELVIS W CONTRAST ? ?Result Date: 09/15/2021 ?CLINICAL DATA:  31 year old female left upper quadrant abdominal pain. EXAM: CT ABDOMEN AND PELVIS WITH CONTRAST TECHNIQUE: Multidetector CT imaging of the abdomen and pelvis was performed using the standard protocol following bolus administration of intravenous contrast. RADIATION DOSE REDUCTION: This exam was performed according to the departmental dose-optimization program which includes automated exposure control, adjustment of  the mA and/or kV according to patient size and/or use of iterative reconstruction technique. CONTRAST:  162mL OMNIPAQUE IOHEXOL 300 MG/ML  SOLN COMPARISON:  None. FINDINGS: Lower chest: Mild global cardiomegaly. Visualized lung bases are clear. Hepatobiliary: Ill-defined hypoattenuation about the falciform ligament favored represent focal fatty infiltration. Otherwise no focal liver abnormality. The liver is normal in size, contour, and attenuation. The gallbladder is present unremarkable. No intra or extrahepatic biliary ductal dilation. Pancreas: Unremarkable. No pancreatic ductal dilatation or surrounding inflammatory changes. Spleen: Normal in size without focal abnormality. Adrenals/Urinary Tract: Adrenal glands are unremarkable. There are few punctate faintly calcified  nonobstructive renal calculi in the right inferior pole. Kidneys are otherwise normal, without focal lesion,or hydronephrosis. Bladder is unremarkable. Stomach/Bowel: Stomach is within normal limits. Appendix appears normal. There are few small scattered colonic diverticula. No evidence of bowel wall thickening, distention, or inflammatory changes. Vascular/Lymphatic: No significant vascular findings are present. No enlarged abdominal or pelvic lymph nodes. Reproductive: Uterus and bilateral adnexa are unremarkable. Other: No abdominal wall hernia or abnormality. No abdominopelvic ascites. Musculoskeletal: No acute or significant osseous findings. IMPRESSION: 1. No acute abdominopelvic abnormality to explain left upper quadrant abdominal pain. 2. Mild colonic diverticulosis without evidence of diverticulitis. 3. Mild global cardiomegaly. 4. Few scattered punctate right inferior pole renal calculi, nonobstructive. Ruthann Cancer, MD [facility] Electronically Signed   By: Ruthann Cancer M.D.   On: 09/15/2021 13:17   ? ? ?PROCEDURES: ? ?Critical Care performed: No ? ?Procedures ? ? ?MEDICATIONS ORDERED IN ED: ?Medications  ?ondansetron (ZOFRAN) injection 4 mg (0 mg Intravenous Hold 09/15/21 1326)  ?pantoprazole (PROTONIX) injection 40 mg (40 mg Intravenous Given 09/15/21 1323)  ?iohexol (OMNIPAQUE) 300 MG/ML solution 100 mL (100 mLs Intravenous Contrast Given 09/15/21 1300)  ? ? ? ?IMPRESSION / MDM / ASSESSMENT AND PLAN / ED COURSE  ?I reviewed the triage vital signs and the nursing notes. ?             ?               ? ?Differential diagnosis includes, but is not limited to, biliary disease (biliary colic, acute cholecystitis, cholangitis, choledocholithiasis, etc), intrathoracic causes for epigastric abdominal pain including ACS, gastritis, duodenitis, pancreatitis, small bowel or large bowel obstruction, abdominal aortic aneurysm, hernia, and ulcer(s). ? ?The  patient is on the cardiac monitor to evaluate for evidence of arrhythmia and/or significant heart rate changes. ? ?Patient to the ED for evaluation of left upper quadrant pain, postprandial nausea, and reflux.  She presents in no acute distress, afebrile and without toxic appearance.  No findings concerning for an acute abdominal process, however patient is further evaluated with labs and CT.  Labs are reassuring as it shows no white count no lipase elevation, and no electrolyte abnormality.  CT imaging is reassuring as it shows no acute findings.  Diverticular disease without acute diverticulitis.  Some small punctate renal stones, but no other findings to explain the patient's LUQ pain.  Patient reports improvement after IV Protonix and nausea medicine.  She be discharged with prescriptions for the same, and will see her primary provider as planned.  Patient's diagnosis is consistent with GERD. Patient is given ED precautions to return to the ED for any worsening or new symptoms. ? ? ? ?FINAL CLINICAL IMPRESSION(S) / ED DIAGNOSES  ? ?Final diagnoses:  ?LUQ abdominal pain  ?Gastroesophageal reflux disease without esophagitis  ?Diverticulosis  ? ? ? ?Rx / DC Orders  ? ?  ED Discharge Orders   ? ?      Ordered  ?  pantoprazole (PROTONIX) 40 MG tablet  Daily       ? 09/15/21 1353  ?  ondansetron (ZOFRAN-ODT) 4 MG disintegrating tablet  Every 8 hours PRN       ? 09/15/21 1353  ? ?  ?  ? ?  ? ? ? ?Note:  This document was prepared using Dragon voice recognition software and may include unintentional dictation errors. ? ?  ?Melvenia Needles, PA-C ?09/15/21 1402 ? ?  ?Carrie Mew, MD ?09/17/21 0001 ? ?

## 2021-09-15 NOTE — ED Notes (Signed)
Urine sent to lab 

## 2021-10-04 ENCOUNTER — Encounter: Payer: Self-pay | Admitting: Primary Care

## 2021-10-04 ENCOUNTER — Ambulatory Visit (INDEPENDENT_AMBULATORY_CARE_PROVIDER_SITE_OTHER): Payer: Medicaid Other | Admitting: Primary Care

## 2021-10-04 VITALS — BP 116/78 | HR 67 | Ht 66.0 in | Wt 254.0 lb

## 2021-10-04 DIAGNOSIS — R1032 Left lower quadrant pain: Secondary | ICD-10-CM | POA: Insufficient documentation

## 2021-10-04 NOTE — Patient Instructions (Addendum)
You will be contacted regarding your referral to GI.  Please let us know if you have not been contacted within two weeks.  ? ?Continue to work on a healthier diet. ?Continue regular exercise. ? ?You have diverticulosis, not diverticulitis.  ? ?It was a pleasure to see you today! ? ? ?

## 2021-10-04 NOTE — Assessment & Plan Note (Signed)
Reviewed ED notes and CT scan with patient today.  We discussed the difference between diverticulosis and diverticulitis, she did not have diverticulitis. ? ?Unclear etiology of pain, negative exam today. Labs and CT negative.  Her anxiety did improve as we discussed the difference between diverticulosis and diverticulitis.  ? ?Low suspicion for renal stones, ovarian involvement, UTI.  She could be experiencing some MSK symptoms that she has been exercising at the gym recently. ? ?Referral placed to GI per patient request. ?We discussed strict return precautions which include nausea, vomiting, stool changes, increased abdominal pain. ?
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
## 2021-11-04 ENCOUNTER — Telehealth: Payer: Self-pay | Admitting: Primary Care

## 2021-11-04 NOTE — Telephone Encounter (Signed)
Pt called and said that she was recently diagnosed with diverticulosis and said that her pain is high and she wanted to know if there is something that she can take to help with the pain. She said tylenol makes her feel dizzy. Please advise. Call back is [phone] ?

## 2021-11-04 NOTE — Telephone Encounter (Signed)
Tried to call pt back , her phone was not accepting calls .  ?

## 2021-11-08 NOTE — Telephone Encounter (Signed)
It looks like she has diverticulosis and not diverticulitis which is reassuring. ? ?Okay to take ibuprofen as needed for menstrual cramping.  I do not recommend taking ibuprofen routinely. ?

## 2021-11-08 NOTE — Telephone Encounter (Signed)
LOV 10/04/21, spoke with patient. She is doing better now. Patient states her pain is worse when she has menstrual cycle and when she goggled it said not to take Ibuprofen and Tylenol makes her dizzy. Patient wanted to know what she could take for pain when she has flare ups? No issues at this time. ?

## 2021-11-09 NOTE — Telephone Encounter (Signed)
Called patient reviewed all information and repeated back to me. Will call if any questions.  ? ?

## 2021-12-14 DIAGNOSIS — R109 Unspecified abdominal pain: Secondary | ICD-10-CM | POA: Diagnosis present

## 2021-12-14 DIAGNOSIS — N200 Calculus of kidney: Secondary | ICD-10-CM | POA: Diagnosis not present

## 2021-12-15 ENCOUNTER — Emergency Department: Payer: Medicaid Other

## 2021-12-15 ENCOUNTER — Emergency Department
Admission: EM | Admit: 2021-12-15 | Discharge: 2021-12-15 | Disposition: A | Discharge status: Home / Self Care | Payer: Medicaid Other | Attending: Emergency Medicine | Admitting: Emergency Medicine

## 2021-12-15 ENCOUNTER — Encounter: Payer: Self-pay | Admitting: Emergency Medicine

## 2021-12-15 DIAGNOSIS — R109 Unspecified abdominal pain: Secondary | ICD-10-CM

## 2021-12-15 DIAGNOSIS — R10A2 Flank pain, left side: Secondary | ICD-10-CM

## 2021-12-15 HISTORY — DX: Diverticulosis of intestine, part unspecified, without perforation or abscess without bleeding: K57.90

## 2021-12-15 LAB — BASIC METABOLIC PANEL
Anion gap: 4 — ABNORMAL LOW (ref 5–15)
BUN: 13 mg/dL (ref 6–20)
CO2: 26 mmol/L (ref 22–32)
Calcium: 8.8 mg/dL — ABNORMAL LOW (ref 8.9–10.3)
Chloride: 109 mmol/L (ref 98–111)
Creatinine, Ser: 0.92 mg/dL (ref 0.44–1.00)
GFR, Estimated: >60 mL/min (ref >60)
Glucose, Bld: 93 mg/dL (ref 70–99)
Potassium: 3.4 mmol/L — ABNORMAL LOW (ref 3.5–5.1)
Sodium: 139 mmol/L (ref 135–145)

## 2021-12-15 LAB — URINALYSIS, ROUTINE W REFLEX MICROSCOPIC
Bilirubin Urine: NEGATIVE
Glucose, UA: NEGATIVE mg/dL
Hgb urine dipstick: NEGATIVE
Ketones, ur: NEGATIVE mg/dL
Leukocytes,Ua: NEGATIVE
Nitrite: NEGATIVE
Protein, ur: NEGATIVE mg/dL
Specific Gravity, Urine: 1.027 (ref 1.005–1.030)
pH: 6 (ref 5.0–8.0)

## 2021-12-15 LAB — CBC
HCT: 34 % — ABNORMAL LOW (ref 36.0–46.0)
Hemoglobin: 10.9 g/dL — ABNORMAL LOW (ref 12.0–15.0)
MCH: 29 pg (ref 26.0–34.0)
MCHC: 32.1 g/dL (ref 30.0–36.0)
MCV: 90.4 fL (ref 80.0–100.0)
Platelets: 385 10*3/uL (ref 150–400)
RBC: 3.76 MIL/uL — ABNORMAL LOW (ref 3.87–5.11)
RDW: 13.8 % (ref 11.5–15.5)
WBC: 7.7 10*3/uL (ref 4.0–10.5)
nRBC: 0 % (ref 0.0–0.2)

## 2021-12-15 LAB — PREGNANCY, URINE: Preg Test, Ur: NEGATIVE

## 2021-12-15 MED ORDER — KETOROLAC TROMETHAMINE 30 MG/ML IJ SOLN
60.0000 mg | Freq: Once | INTRAMUSCULAR | Status: AC
  Administered 2021-12-15: 60 mg via INTRAMUSCULAR
  Filled 2021-12-15: qty 2

## 2021-12-15 MED ORDER — IBUPROFEN 800 MG PO TABS: 800.0000 mg | ORAL_TABLET | Freq: Three times a day (TID) | ORAL | 0 refills | Status: DC | PRN

## 2021-12-15 NOTE — ED Notes (Signed)
This RN attempted to have patient obtain Urine Specimen; states she is unable to go at this time and requested a cup of water.  Water given per request.

## 2021-12-15 NOTE — ED Triage Notes (Signed)
Pt presents via POV with complaints of left sided flank pain that started yesterday. Denies N/V/D - no urinary sx.

## 2021-12-15 NOTE — ED Notes (Signed)
Lab notified to add on Urine Pregnancy 
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
## 2021-12-15 NOTE — Discharge Instructions (Addendum)
You may alternate Tylenol 1000 mg every 6 hours as needed for pain, fever and Ibuprofen 800 mg every 6-8 hours as needed for pain, fever.  Please take Ibuprofen with food.  Do not take more than 4000 mg of Tylenol (acetaminophen) in a 24 hour period.   Your lab work today was normal.  Urine showed no sign of infection.  CT scan showed no acute abnormality.  Your pain is likely musculoskeletal in nature.  I recommend that you try to avoid lifting anything heavy over the next several days.  You may alternate between ice and heat to your back.   Please follow-up with your primary care doctor in 1 week if your symptoms or not improving with rest, alternating ice and heat, over-the-counter medications such as Tylenol and ibuprofen.

## 2022-01-11 ENCOUNTER — Encounter: Payer: Self-pay | Admitting: Emergency Medicine

## 2022-01-11 ENCOUNTER — Emergency Department: Payer: Medicaid Other

## 2022-01-11 DIAGNOSIS — Z5321 Procedure and treatment not carried out due to patient leaving prior to being seen by health care provider: Secondary | ICD-10-CM | POA: Insufficient documentation

## 2022-01-11 DIAGNOSIS — R072 Precordial pain: Secondary | ICD-10-CM | POA: Insufficient documentation

## 2022-01-11 DIAGNOSIS — R42 Dizziness and giddiness: Secondary | ICD-10-CM | POA: Insufficient documentation

## 2022-01-11 LAB — URINALYSIS, ROUTINE W REFLEX MICROSCOPIC
Bilirubin Urine: NEGATIVE
Glucose, UA: NEGATIVE mg/dL
Hgb urine dipstick: NEGATIVE
Ketones, ur: NEGATIVE mg/dL
Nitrite: NEGATIVE
Protein, ur: 30 mg/dL — AB
Specific Gravity, Urine: 1.025 (ref 1.005–1.030)
pH: 5 (ref 5.0–8.0)

## 2022-01-11 LAB — BASIC METABOLIC PANEL
Anion gap: 10 (ref 5–15)
BUN: 12 mg/dL (ref 6–20)
CO2: 25 mmol/L (ref 22–32)
Calcium: 9.2 mg/dL (ref 8.9–10.3)
Chloride: 107 mmol/L (ref 98–111)
Creatinine, Ser: 1.01 mg/dL — ABNORMAL HIGH (ref 0.44–1.00)
GFR, Estimated: >60 mL/min (ref >60)
Glucose, Bld: 93 mg/dL (ref 70–99)
Potassium: 3.4 mmol/L — ABNORMAL LOW (ref 3.5–5.1)
Sodium: 142 mmol/L (ref 135–145)

## 2022-01-11 LAB — CBC
HCT: 34.3 % — ABNORMAL LOW (ref 36.0–46.0)
Hemoglobin: 11 g/dL — ABNORMAL LOW (ref 12.0–15.0)
MCH: 28.9 pg (ref 26.0–34.0)
MCHC: 32.1 g/dL (ref 30.0–36.0)
MCV: 90 fL (ref 80.0–100.0)
Platelets: 397 10*3/uL (ref 150–400)
RBC: 3.81 MIL/uL — ABNORMAL LOW (ref 3.87–5.11)
RDW: 13.6 % (ref 11.5–15.5)
WBC: 6.6 10*3/uL (ref 4.0–10.5)
nRBC: 0 % (ref 0.0–0.2)

## 2022-01-11 LAB — TROPONIN I (HIGH SENSITIVITY): Troponin I (High Sensitivity): 3 ng/L (ref <18)

## 2022-01-11 LAB — POC URINE PREG, ED: Preg Test, Ur: NEGATIVE

## 2022-01-11 NOTE — ED Triage Notes (Signed)
Pt presents via POV with complaints of mid-sternal CP that started about 1 hour ago with associated dizziness. Denies SOB, LOC, fevers, chills, or cold like sx.

## 2022-01-12 ENCOUNTER — Emergency Department
Admission: EM | Admit: 2022-01-12 | Discharge: 2022-01-12 | Discharge status: Against Medical Advice | Payer: Medicaid Other | Attending: Emergency Medicine | Admitting: Emergency Medicine

## 2022-01-12 NOTE — ED Notes (Signed)
No answer when called several times from lobby 

## 2022-01-12 NOTE — ED Notes (Signed)
No answer when called several times from lobby; no answer when phone # listed in chart called 

## 2022-01-14 ENCOUNTER — Other Ambulatory Visit: Payer: Self-pay

## 2022-01-14 ENCOUNTER — Emergency Department: Payer: Medicaid Other

## 2022-01-14 ENCOUNTER — Emergency Department
Admission: EM | Admit: 2022-01-14 | Discharge: 2022-01-14 | Disposition: A | Discharge status: Home / Self Care | Payer: Medicaid Other | Attending: Emergency Medicine | Admitting: Emergency Medicine

## 2022-01-14 ENCOUNTER — Encounter: Payer: Self-pay | Admitting: *Deleted

## 2022-01-14 DIAGNOSIS — R42 Dizziness and giddiness: Secondary | ICD-10-CM | POA: Insufficient documentation

## 2022-01-14 DIAGNOSIS — R1031 Right lower quadrant pain: Secondary | ICD-10-CM | POA: Diagnosis not present

## 2022-01-14 DIAGNOSIS — K219 Gastro-esophageal reflux disease without esophagitis: Secondary | ICD-10-CM | POA: Insufficient documentation

## 2022-01-14 DIAGNOSIS — R110 Nausea: Secondary | ICD-10-CM | POA: Diagnosis present

## 2022-01-14 LAB — CBC WITH DIFFERENTIAL/PLATELET
Abs Immature Granulocytes: 0.01 10*3/uL (ref 0.00–0.07)
Basophils Absolute: 0 10*3/uL (ref 0.0–0.1)
Basophils Relative: 0 %
Eosinophils Absolute: 0.1 10*3/uL (ref 0.0–0.5)
Eosinophils Relative: 1 %
HCT: 35.7 % — ABNORMAL LOW (ref 36.0–46.0)
Hemoglobin: 11.5 g/dL — ABNORMAL LOW (ref 12.0–15.0)
Immature Granulocytes: 0 %
Lymphocytes Relative: 51 %
Lymphs Abs: 3.7 10*3/uL (ref 0.7–4.0)
MCH: 29.1 pg (ref 26.0–34.0)
MCHC: 32.2 g/dL (ref 30.0–36.0)
MCV: 90.4 fL (ref 80.0–100.0)
Monocytes Absolute: 0.3 10*3/uL (ref 0.1–1.0)
Monocytes Relative: 5 %
Neutro Abs: 3.1 10*3/uL (ref 1.7–7.7)
Neutrophils Relative %: 43 %
Platelets: 382 10*3/uL (ref 150–400)
RBC: 3.95 MIL/uL (ref 3.87–5.11)
RDW: 13.6 % (ref 11.5–15.5)
WBC: 7.2 10*3/uL (ref 4.0–10.5)
nRBC: 0 % (ref 0.0–0.2)

## 2022-01-14 LAB — URINALYSIS, ROUTINE W REFLEX MICROSCOPIC
Bilirubin Urine: NEGATIVE
Glucose, UA: NEGATIVE mg/dL
Hgb urine dipstick: NEGATIVE
Ketones, ur: NEGATIVE mg/dL
Leukocytes,Ua: NEGATIVE
Nitrite: NEGATIVE
Protein, ur: NEGATIVE mg/dL
Specific Gravity, Urine: 1.004 — ABNORMAL LOW (ref 1.005–1.030)
pH: 8 (ref 5.0–8.0)

## 2022-01-14 LAB — COMPREHENSIVE METABOLIC PANEL
ALT: 18 U/L (ref 0–44)
AST: 21 U/L (ref 15–41)
Albumin: 3.8 g/dL (ref 3.5–5.0)
Alkaline Phosphatase: 74 U/L (ref 38–126)
Anion gap: 5 (ref 5–15)
BUN: 13 mg/dL (ref 6–20)
CO2: 25 mmol/L (ref 22–32)
Calcium: 8.7 mg/dL — ABNORMAL LOW (ref 8.9–10.3)
Chloride: 109 mmol/L (ref 98–111)
Creatinine, Ser: 1.01 mg/dL — ABNORMAL HIGH (ref 0.44–1.00)
GFR, Estimated: >60 mL/min (ref >60)
Glucose, Bld: 98 mg/dL (ref 70–99)
Potassium: 3.5 mmol/L (ref 3.5–5.1)
Sodium: 139 mmol/L (ref 135–145)
Total Bilirubin: 0.6 mg/dL (ref 0.3–1.2)
Total Protein: 7.4 g/dL (ref 6.5–8.1)

## 2022-01-14 LAB — MAGNESIUM: Magnesium: 1.9 mg/dL (ref 1.7–2.4)

## 2022-01-14 LAB — PREGNANCY, URINE: Preg Test, Ur: NEGATIVE

## 2022-01-14 MED ORDER — METOCLOPRAMIDE HCL 5 MG/ML IJ SOLN
10.0000 mg | Freq: Once | INTRAMUSCULAR | Status: AC
  Administered 2022-01-14: 10 mg via INTRAVENOUS
  Filled 2022-01-14: qty 2

## 2022-01-14 MED ORDER — LACTATED RINGERS IV BOLUS
1000.0000 mL | Freq: Once | INTRAVENOUS | Status: AC
  Administered 2022-01-14: 1000 mL via INTRAVENOUS

## 2022-01-14 MED ORDER — METOCLOPRAMIDE HCL 10 MG PO TABS: 10.0000 mg | ORAL_TABLET | Freq: Three times a day (TID) | ORAL | 0 refills | Status: DC | PRN

## 2022-01-14 MED ORDER — IOHEXOL 300 MG/ML  SOLN
100.0000 mL | Freq: Once | INTRAMUSCULAR | Status: AC | PRN
  Administered 2022-01-14: 100 mL via INTRAVENOUS

## 2022-01-14 NOTE — ED Notes (Signed)
ED Provider at bedside. 

## 2022-01-14 NOTE — ED Provider Notes (Signed)
[facility] Provider Note    Event Date/Time   First MD Initiated Contact with Patient 01/14/22 0422     (approximate)   History   Nausea   HPI  Joann King is a 31 y.o. female who presents to the ED for evaluation of Nausea   I reviewed PCP visit from 4/4.  Morbidly obese patient with history of GERD.  Diverticulosis.  Patient presents to the ED for evaluation of nausea and dizziness.  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  After getting home she reports developing nausea without emesis or abdominal pain.  Reports feeling dizzy alongside the nausea, but improved now with Zofran and prehospital IV fluids.  No chest pain, dysuria or syncope.   Physical Exam   Triage Vital Signs: ED Triage Vitals  Enc Vitals Group     BP      Pulse      Resp      Temp      Temp src      SpO2      Weight      Height      Head Circumference      Peak Flow      Pain Score      Pain Loc      Pain Edu?      Excl. in GC?     Most recent vital signs: Vitals:   01/14/22 0432 01/14/22 0535  BP: 138/82 130/78  Pulse: 73 64  Resp: (!) 22 14  Temp: 98.1 F (36.7 C)   SpO2: 98% 100%    General: Awake, no distress.  CV:  Good peripheral perfusion.  Resp:  Normal effort.  Abd:  No distention.  Diffuse lower abdominal tenderness without further localizing or peritoneal features.  No guarding.  Upper abdomen is benign. MSK:  No deformity noted.  Neuro:  No focal deficits appreciated. Other:     ED Results / Procedures / Treatments   Labs (all labs ordered are listed, but only abnormal results are displayed) Labs Reviewed  URINALYSIS, ROUTINE W REFLEX MICROSCOPIC - Abnormal; Notable for the following components:      Result Value   Color, Urine COLORLESS (*)    APPearance CLEAR (*)    Specific Gravity, Urine 1.004 (*)    All other components within normal limits  CBC WITH DIFFERENTIAL/PLATELET - Abnormal; Notable for the following  components:   Hemoglobin 11.5 (*)    HCT 35.7 (*)    All other components within normal limits  COMPREHENSIVE METABOLIC PANEL - Abnormal; Notable for the following components:   Creatinine, Ser 1.01 (*)    Calcium 8.7 (*)    All other components within normal limits  MAGNESIUM  PREGNANCY, URINE    EKG Sinus rhythm with a rate of 77 bpm.  Normal axis and intervals.  Nonspecific ST changes isolated to lead III.  No further signs of acute ischemia. EKG from 3 days ago with similar nonspecific changes.  RADIOLOGY CT abdomen/pelvis interpreted by me with full urinary tract system, but no signs of ureteral stones, appendicitis.  Official radiology report(s): CT ABDOMEN PELVIS W CONTRAST  Result Date: 01/14/2022 CLINICAL DATA:  31 year old female with right lower quadrant abdominal pain, nausea, dizziness. EXAM: CT ABDOMEN AND PELVIS WITH CONTRAST TECHNIQUE: Multidetector CT imaging of the abdomen and pelvis was performed using the standard protocol following bolus administration of intravenous contrast. RADIATION DOSE REDUCTION: This exam was performed according to the  departmental dose-optimization program which includes automated exposure control, adjustment of the mA and/or kV according to patient size and/or use of iterative reconstruction technique. CONTRAST:  OMNIPAQUE IOHEXOL 300 MG/ML  SOLN COMPARISON:  CT Abdomen and Pelvis 12/15/2021, 09/15/2021. FINDINGS: Lower chest: Mild respiratory motion, otherwise negative. Hepatobiliary: Patchy hepatic steatosis along the anterior right lobe. Otherwise negative liver and gallbladder. Pancreas: Negative. Spleen: Negative. Adrenals/Urinary Tract: Normal adrenal glands. Punctate right lower pole nephrolithiasis (coronal image 63). The bladder is more distended (376 mL) and both ureters are larger today, with questionable bilateral urothelial thickening and enhancement (series 2, images 51 and 58). Symmetric mild enlargement of both renal  collecting systems also when compared to March. But no striated renal enhancement or convincing pararenal inflammation. And no other urinary calculus. No delayed renal images. Stomach/Bowel: Mildly increased retained stool in the rectum and sigmoid colon. Mildly redundant sigmoid. Decompressed descending colon. Redundant hepatic flexure. Retrocecal appendix remains normal on coronal image 54. No large bowel inflammation identified. Terminal ileum is decompressed. No dilated small bowel. Fluid and food in the stomach. Duodenum is decompressed. No free air, free fluid, or mesenteric inflammation. Vascular/Lymphatic: Major arterial structures and the portal venous system appear to be patent. No calcified atherosclerosis or lymphadenopathy identified. Reproductive: Within normal limits. Other: No pelvic free fluid. Musculoskeletal: No acute osseous abnormality identified. IMPRESSION: 1. Increased distension of the urinary bladder and both ureters, with questionable bilateral urothelial thickening. Consider ascending urinary tract infection. No convincing pyelonephritis. Punctate right lower pole nephrolithiasis, but no unilateral obstructive uropathy. 2. Normal appendix, and no other acute or inflammatory process identified in the abdomen or pelvis. Electronically Signed   By: Odessa Fleming M.D.   On: 01/14/2022 05:28    PROCEDURES and INTERVENTIONS:  .1-3 Lead EKG Interpretation  Performed by: Delton Prairie, MD Authorized by: Delton Prairie, MD     Interpretation: normal     ECG rate:  70   ECG rate assessment: normal     Rhythm: sinus rhythm     Ectopy: none     Conduction: normal     Medications  lactated ringers bolus 1,000 mL (0 mLs Intravenous Stopped 01/14/22 0531)  iohexol (OMNIPAQUE) 300 MG/ML solution 100 mL (100 mLs Intravenous Contrast Given 01/14/22 0504)  metoCLOPramide (REGLAN) injection 10 mg (10 mg Intravenous Given 01/14/22 0534)     IMPRESSION / MDM / ASSESSMENT AND PLAN / ED COURSE  I  reviewed the triage vital signs and the nursing notes.  Differential diagnosis includes, but is not limited to, acute appendicitis, diverticulitis, acute cystitis, anxiety, ureteral stone, cardiac dysrhythmia  {Patient presents with symptoms of an acute illness or injury that is potentially life-threatening.  31 year old female presents to the ED with isolated and transient nausea and dizziness, without evidence of acute pathology and suitable for outpatient management.  Reassuring vital signs.  Exam with lower abdominal tenderness concern for the possibility of intra-abdominal pathology such as appendicitis, so CT obtained and without evidence of such.  Is noted to have full bladder and ureteral system, but no obstructing stones and she has no urinary symptoms.  UA is clear and I doubt cystitis.  Metabolic panel and CBC are essentially normal.  Resolving symptoms after Reglan and fluids.  I see no barriers to outpatient management.  She is tolerating p.o. and feels well.  We will discharge with Reglan and return precautions.  Clinical Course as of 01/14/22 0556  Sat Jan 14, 2022  0539 Reassessed.  Patient reports feeling better.  We discussed reassuring work-up.  Clarified urinary symptoms, no symptoms to suggest UTI. [DS]    Clinical Course User Index [DS] Delton Prairie, MD     FINAL CLINICAL IMPRESSION(S) / ED DIAGNOSES   Final diagnoses:  Nausea  Dizziness     Rx / DC Orders   ED Discharge Orders          Ordered    metoCLOPramide (REGLAN) 10 MG tablet  Every 8 hours PRN        01/14/22 0543             Note:  This document was prepared using Dragon voice recognition software and may include unintentional dictation errors.   Delton Prairie, MD 01/14/22 [phone]

## 2022-01-14 NOTE — ED Triage Notes (Signed)
Pt arrives via [facility] from home. Per medic report, the pt has been having nausea and dizziness tonight. She had it before going to bed, woke up and symptoms are worse. En route, IV established, given approx NS, 4 zofran in the R AC IV. Vitals 127/87, hr82, 98% RA, cbg 71

## 2022-04-07 ENCOUNTER — Ambulatory Visit
Admission: EM | Admit: 2022-04-07 | Discharge: 2022-04-07 | Disposition: A | Discharge status: Home / Self Care | Payer: Medicaid Other | Attending: Emergency Medicine | Admitting: Emergency Medicine

## 2022-04-07 DIAGNOSIS — N898 Other specified noninflammatory disorders of vagina: Secondary | ICD-10-CM | POA: Diagnosis not present

## 2022-04-07 DIAGNOSIS — Z113 Encounter for screening for infections with a predominantly sexual mode of transmission: Secondary | ICD-10-CM | POA: Insufficient documentation

## 2022-04-07 LAB — POCT URINALYSIS DIP (MANUAL ENTRY)
Bilirubin, UA: NEGATIVE
Blood, UA: NEGATIVE
Glucose, UA: NEGATIVE mg/dL
Ketones, POC UA: NEGATIVE mg/dL
Nitrite, UA: NEGATIVE
Protein Ur, POC: NEGATIVE mg/dL
Spec Grav, UA: 1.025 (ref 1.010–1.025)
Urobilinogen, UA: 0.2 E.U./dL
pH, UA: 6.5 (ref 5.0–8.0)

## 2022-04-07 LAB — POCT URINE PREGNANCY: Preg Test, Ur: NEGATIVE

## 2022-04-07 MED ORDER — NYSTATIN 100000 UNIT/GM EX CREA: TOPICAL_CREAM | CUTANEOUS | 0 refills | Status: DC
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
## 2022-04-07 NOTE — ED Triage Notes (Signed)
Patient presents to [facility] for vaginal itching x 2 days. Reports she had sexual intercourse a couple days ago. Requesting STD testing.

## 2022-04-10 LAB — CERVICOVAGINAL ANCILLARY ONLY
Bacterial Vaginitis (gardnerella): NEGATIVE
Candida Glabrata: NEGATIVE
Candida Vaginitis: POSITIVE — AB
Chlamydia: NEGATIVE
Comment: NEGATIVE
Comment: NEGATIVE
Comment: NEGATIVE
Comment: NEGATIVE
Comment: NEGATIVE
Comment: NORMAL
Neisseria Gonorrhea: NEGATIVE
Trichomonas: NEGATIVE

## 2022-04-11 ENCOUNTER — Telehealth: Payer: Self-pay | Admitting: Emergency Medicine

## 2022-04-11 MED ORDER — FLUCONAZOLE 150 MG PO TABS: 150.0000 mg | ORAL_TABLET | Freq: Once | ORAL | 0 refills | Status: AC

## 2022-04-26 ENCOUNTER — Ambulatory Visit: Payer: Medicaid Other | Admitting: Family Medicine

## 2022-05-09 ENCOUNTER — Ambulatory Visit
Admission: EM | Admit: 2022-05-09 | Discharge: 2022-05-09 | Disposition: A | Discharge status: Home / Self Care | Payer: Medicaid Other | Attending: Urgent Care | Admitting: Urgent Care

## 2022-05-09 DIAGNOSIS — R6883 Chills (without fever): Secondary | ICD-10-CM | POA: Insufficient documentation

## 2022-05-09 DIAGNOSIS — Z1152 Encounter for screening for COVID-19: Secondary | ICD-10-CM | POA: Diagnosis not present

## 2022-05-09 DIAGNOSIS — R6889 Other general symptoms and signs: Secondary | ICD-10-CM | POA: Diagnosis present

## 2022-05-09 LAB — RESP PANEL BY RT-PCR (RSV, FLU A&B, COVID)  RVPGX2
Influenza A by PCR: NEGATIVE
Influenza B by PCR: NEGATIVE
Resp Syncytial Virus by PCR: NEGATIVE
SARS Coronavirus 2 by RT PCR: NEGATIVE
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
## 2022-05-09 NOTE — Discharge Instructions (Signed)
The results of your respiratory swab will be available in your MyChart account.  If positive, someone may contact you to discuss available treatment.  Follow up here or with your primary care provider if your symptoms are worsening or not improving.

## 2022-05-09 NOTE — ED Triage Notes (Signed)
Pt. Presents to UC w/ c/o nasal congestion, fatigue and chills since yesterday. Pt. Expresses concern for COVID.

## 2022-05-31 ENCOUNTER — Telehealth: Payer: Self-pay | Admitting: Primary Care

## 2022-05-31 NOTE — Telephone Encounter (Signed)
Called and scheduled the patient an appointment for 12/06 to discuss.
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
## 2022-06-07 ENCOUNTER — Ambulatory Visit: Payer: Medicaid Other | Admitting: Primary Care

## 2022-06-12 ENCOUNTER — Ambulatory Visit: Payer: Medicaid Other | Admitting: Primary Care

## 2022-06-20 ENCOUNTER — Ambulatory Visit
Admission: EM | Admit: 2022-06-20 | Discharge: 2022-06-20 | Disposition: A | Discharge status: Home / Self Care | Payer: Medicaid Other

## 2022-06-20 ENCOUNTER — Emergency Department: Payer: Medicaid Other

## 2022-06-20 ENCOUNTER — Other Ambulatory Visit: Payer: Self-pay

## 2022-06-20 ENCOUNTER — Emergency Department
Admission: EM | Admit: 2022-06-20 | Discharge: 2022-06-20 | Disposition: A | Discharge status: Home / Self Care | Payer: Medicaid Other | Attending: Emergency Medicine | Admitting: Emergency Medicine

## 2022-06-20 DIAGNOSIS — R059 Cough, unspecified: Secondary | ICD-10-CM | POA: Diagnosis present

## 2022-06-20 DIAGNOSIS — J101 Influenza due to other identified influenza virus with other respiratory manifestations: Secondary | ICD-10-CM | POA: Insufficient documentation

## 2022-06-20 DIAGNOSIS — Z1152 Encounter for screening for COVID-19: Secondary | ICD-10-CM | POA: Insufficient documentation

## 2022-06-20 DIAGNOSIS — H6992 Unspecified Eustachian tube disorder, left ear: Secondary | ICD-10-CM | POA: Diagnosis not present

## 2022-06-20 DIAGNOSIS — H9209 Otalgia, unspecified ear: Secondary | ICD-10-CM | POA: Diagnosis not present

## 2022-06-20 LAB — RESP PANEL BY RT-PCR (RSV, FLU A&B, COVID)  RVPGX2
Influenza A by PCR: POSITIVE — AB
Influenza B by PCR: NEGATIVE
Resp Syncytial Virus by PCR: NEGATIVE
SARS Coronavirus 2 by RT PCR: NEGATIVE

## 2022-06-20 MED ORDER — PREDNISONE 20 MG PO TABS: 20.0000 mg | ORAL_TABLET | Freq: Every day | ORAL | 0 refills | Status: AC

## 2022-06-20 MED ORDER — PROMETHAZINE-DM 6.25-15 MG/5ML PO SYRP: 5.0000 mL | ORAL_SOLUTION | Freq: Four times a day (QID) | ORAL | 0 refills | Status: DC | PRN

## 2022-06-20 MED ORDER — OSELTAMIVIR PHOSPHATE 75 MG PO CAPS: 75.0000 mg | ORAL_CAPSULE | Freq: Two times a day (BID) | ORAL | 0 refills | Status: AC

## 2022-06-20 NOTE — ED Triage Notes (Signed)
Pt. Presents to UC w/ c/o left ear pain since yesterday.

## 2022-06-20 NOTE — Discharge Instructions (Addendum)
Since you believe you may be intolerance to prednisone, a corticosteroid taken orally, I recommend that you use Flonase (fluticasone) a nasal steroid spray which will be applied locally to your internal nasal passageways.  You may also wish to try Sudafed (pseudoephedrine), a nasal decongestant.  Both of these medications are available without prescription though Sudafed must be obtained from behind the pharmacist counter.
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
## 2022-06-20 NOTE — Discharge Instructions (Signed)
Please make sure you are drinking lots of fluids and staying hydrated.  Take prednisone 20 mg daily for 5 days and take Tamiflu as prescribed for 5 days.  Take Tylenol 1000 mg every 6 hours as needed for any chills fevers body aches or pains.  Take cough medication as needed.  Return to the ER for any fevers, difficulty breathing, worsening symptoms or any urgent changes in your health

## 2022-06-20 NOTE — ED Provider Triage Note (Signed)
Emergency Medicine Provider Triage Evaluation Note  Joann King , a 31 y.o. female  was evaluated in triage.  Pt complains of cough, runny nose and chest pain with coughing. Fever 102F last night. No flu shot this year  Review of Systems  Positive: Cough, runny nose, fever Negative: Abd pain, n/v/d  Physical Exam  LMP  (LMP Unknown)  Gen:   Awake, no distress   Resp:  Normal effort  MSK:   Moves extremities without difficulty  Other:    Medical Decision Making  Medically screening exam initiated at 5:45 PM.  Appropriate orders placed.  Joann King was informed that the remainder of the evaluation will be completed by another provider, this initial triage assessment does not replace that evaluation, and the importance of remaining in the ED until their evaluation is complete.     Jackelyn Hoehn, PA-C 06/20/22 1756

## 2022-06-20 NOTE — ED Provider Notes (Signed)
[facility] EMERGENCY DEPARTMENT Provider Note   CSN: [id number] Arrival date & time: 06/20/22  1705     History  Chief Complaint  Patient presents with   Otalgia   URI    Joann King is a 31 y.o. female.  For evaluation of flulike illness. + cough runny nose fever, chills, body aches since last night. + influenza today. Not taking any medications. Today CXR negative.  HPI     Home Medications Prior to Admission medications   Medication Sig Start Date End Date Taking? Authorizing Provider  oseltamivir (TAMIFLU) 75 MG capsule Take 1 capsule (75 mg total) by mouth 2 (two) times daily for 5 days. 06/20/22 06/25/22 Yes Evon Slack, PA-C  predniSONE (DELTASONE) 20 MG tablet Take 1 tablet (20 mg total) by mouth daily with breakfast for 5 days. 06/20/22 06/25/22 Yes Evon Slack, PA-C  promethazine-dextromethorphan (PROMETHAZINE-DM) 6.25-15 MG/5ML syrup Take 5 mLs by mouth 4 (four) times daily as needed for cough. 06/20/22  Yes Evon Slack, PA-C  fluconazole (DIFLUCAN) 150 MG tablet Take by mouth. 04/11/22   [provider]  ibuprofen (ADVIL) 800 MG tablet Take 1 tablet (800 mg total) by mouth every 8 (eight) hours as needed for mild pain. 12/15/21   Ward, Layla Maw, DO  metoCLOPramide (REGLAN) 10 MG tablet Take 1 tablet (10 mg total) by mouth every 8 (eight) hours as needed for nausea or vomiting. 01/14/22 01/14/23  Delton Prairie, MD  nystatin cream (MYCOSTATIN) Apply to affected area 2 times daily 04/07/22   Mickie Bail, NP  pantoprazole (PROTONIX) 40 MG tablet Take 1 tablet (40 mg total) by mouth daily. Patient not taking: Reported on 09/15/2021 05/19/21 05/19/22  Merwyn Katos, MD  pantoprazole (PROTONIX) 40 MG tablet Take 1 tablet (40 mg total) by mouth daily. Patient not taking: Reported on 10/04/2021 09/15/21 11/14/21  Menshew, Charlesetta Ivory, PA-C  sertraline (ZOLOFT) 50 MG tablet Take 1 tablet (50 mg total) by mouth daily. For anxiety  and depression. Patient not taking: Reported on 11/21/2019 10/10/19 03/21/20  Doreene Nest, NP      Allergies    Patient has no known allergies.    Review of Systems   Review of Systems  Physical Exam Updated Vital Signs BP 127/82 (BP Location: Left Arm)   Pulse 74   Temp 98.5 F (36.9 C) (Oral)   Resp 20   Ht 5\' 6"  (1.676 m)   Wt 113.4 kg   LMP  (LMP Unknown)   SpO2 98%   BMI 40.35 kg/m  Physical Exam Constitutional:      Appearance: She is well-developed.  HENT:     Head: Normocephalic and atraumatic.     Mouth/Throat:     Pharynx: Oropharynx is clear. No oropharyngeal exudate or posterior oropharyngeal erythema.  Eyes:     Conjunctiva/sclera: Conjunctivae normal.  Cardiovascular:     Rate and Rhythm: Normal rate.  Pulmonary:     Effort: Pulmonary effort is normal. No respiratory distress.     Breath sounds: Normal breath sounds. No wheezing, rhonchi or rales.  Chest:     Chest wall: No tenderness.  Abdominal:     General: Bowel sounds are normal.     Tenderness: There is no abdominal tenderness. There is no guarding.  Musculoskeletal:        General: Normal range of motion.     Cervical back: Normal range of motion.  Skin:    General: Skin is  warm.     Findings: No rash.  Neurological:     Mental Status: She is alert and oriented to person, place, and time.  Psychiatric:        Behavior: Behavior normal.        Thought Content: Thought content normal.     ED Results / Procedures / Treatments   Labs (all labs ordered are listed, but only abnormal results are displayed) Labs Reviewed  RESP PANEL BY RT-PCR (RSV, FLU A&B, COVID)  RVPGX2 - Abnormal; Notable for the following components:      Result Value   Influenza A by PCR POSITIVE (*)    All other components within normal limits    EKG None  Radiology DG Chest 2 View  Result Date: 06/20/2022 CLINICAL DATA:  Cough, fever. EXAM: CHEST - 2 VIEW COMPARISON:  January 11, 2022. FINDINGS: The heart  size and mediastinal contours are within normal limits. Both lungs are clear. The visualized skeletal structures are unremarkable. IMPRESSION: No active cardiopulmonary disease. Electronically Signed   By: Lupita Raider M.D.   On: 06/20/2022 18:30    Procedures Procedures    Medications Ordered in ED Medications - No data to display  ED Course/ Medical Decision Making/ A&P                           Medical Decision Making Risk Prescription drug management.   31 yo female with influenza A. Will treat with tylenol/ibuprofen/tamiflu/cough medication. VSS, Chest xray negative. Will discharge home in stable condition. She understands signs and symptoms to return to the ER for Final Clinical Impression(s) / ED Diagnoses Final diagnoses:  Influenza A    Rx / DC Orders ED Discharge Orders          Ordered    predniSONE (DELTASONE) 20 MG tablet  Daily with breakfast        06/20/22 1918    oseltamivir (TAMIFLU) 75 MG capsule  2 times daily        06/20/22 1918    promethazine-dextromethorphan (PROMETHAZINE-DM) 6.25-15 MG/5ML syrup  4 times daily PRN        06/20/22 1918              Ronnette Juniper 07/12/22 1241    Shaune Pollack, MD 07/12/22 2155

## 2022-06-20 NOTE — ED Triage Notes (Signed)
Reports left ear pain and cough.  Reports had a fever last night and has had sinus issues for past few days.  Also reports pain in chest when coughing.

## 2022-06-21 ENCOUNTER — Telehealth: Payer: Self-pay

## 2022-06-21 NOTE — Telephone Encounter (Signed)
Transition Care Management Follow-up Telephone Call Date of discharge and from where: 06/20/2022 [facility] How have you been since you were released from the hospital? Feeling a little better Any questions or concerns? Yes, would like a referral for OB/GYN  Items Reviewed: Did the pt receive and understand the discharge instructions provided? Yes  Medications obtained and verified?  The pharmacy whe went to did not have the tamiflu available. She is reaching out to another pharmacy Other? No  Any new allergies since your discharge? No  Dietary orders reviewed? No Do you have support at home? Yes   Home Care and Equipment/Supplies: Were home health services ordered? not applicable If so, what is the name of the agency? N/a  Has the agency set up a time to come to the patient's home? not applicable Were any new equipment or medical supplies ordered?  No What is the name of the medical supply agency? N/a Were you able to get the supplies/equipment? not applicable Do you have any questions related to the use of the equipment or supplies? No  Functional Questionnaire: (I = Independent and D = Dependent) ADLs: I  Bathing/Dressing- I  Meal Prep- I  Eating- I  Maintaining continence- I  Transferring/Ambulation- I  Managing Meds- I  Follow up appointments reviewed:  PCP Hospital f/u appt confirmed?  Patient made an appointment to get a referral for an OB/GYN  Scheduled to see Vernona Rieger NP on 07/06/2021 @ 2:20. Are transportation arrangements needed? No  If their condition worsens, is the pt aware to call PCP or go to the Emergency Dept.? Yes Was the patient provided with contact information for the PCP's office or ED? Yes Was to pt encouraged to call back with questions or concerns? Yes

## 2022-06-29 ENCOUNTER — Ambulatory Visit
Admission: EM | Admit: 2022-06-29 | Discharge: 2022-06-29 | Disposition: A | Discharge status: Home / Self Care | Payer: Medicaid Other | Attending: Urgent Care | Admitting: Urgent Care

## 2022-06-29 DIAGNOSIS — Z202 Contact with and (suspected) exposure to infections with a predominantly sexual mode of transmission: Secondary | ICD-10-CM | POA: Diagnosis present

## 2022-06-29 NOTE — ED Triage Notes (Signed)
Pt. Presents to UC requesting STD testing after having unprotected sex 2 weeks ago.Pt. endorses no other symptoms

## 2022-06-29 NOTE — Discharge Instructions (Signed)
Your test results will be available in your MyChart account when completed.Follow up here or with your primary care provider if your symptoms are worsening or not improving.
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
## 2022-06-30 ENCOUNTER — Telehealth (HOSPITAL_COMMUNITY): Payer: Self-pay | Admitting: Emergency Medicine

## 2022-06-30 LAB — CERVICOVAGINAL ANCILLARY ONLY
Bacterial Vaginitis (gardnerella): POSITIVE — AB
Candida Glabrata: NEGATIVE
Candida Vaginitis: NEGATIVE
Chlamydia: NEGATIVE
Comment: NEGATIVE
Comment: NEGATIVE
Comment: NEGATIVE
Comment: NEGATIVE
Comment: NEGATIVE
Comment: NORMAL
Neisseria Gonorrhea: NEGATIVE
Trichomonas: NEGATIVE

## 2022-06-30 LAB — HIV ANTIBODY (ROUTINE TESTING W REFLEX): HIV Screen 4th Generation wRfx: NONREACTIVE

## 2022-06-30 LAB — RPR: RPR Ser Ql: NONREACTIVE

## 2022-06-30 MED ORDER — METRONIDAZOLE 500 MG PO TABS: 500.0000 mg | ORAL_TABLET | Freq: Two times a day (BID) | ORAL | 0 refills | Status: DC

## 2022-07-06 ENCOUNTER — Ambulatory Visit (INDEPENDENT_AMBULATORY_CARE_PROVIDER_SITE_OTHER): Payer: Medicaid Other | Admitting: Primary Care

## 2022-07-06 ENCOUNTER — Encounter: Payer: Self-pay | Admitting: Primary Care

## 2022-07-06 VITALS — BP 122/80 | HR 82 | Temp 99.9°F | Ht 66.0 in | Wt 261.0 lb

## 2022-07-06 DIAGNOSIS — R079 Chest pain, unspecified: Secondary | ICD-10-CM | POA: Diagnosis not present

## 2022-07-06 DIAGNOSIS — D649 Anemia, unspecified: Secondary | ICD-10-CM

## 2022-07-06 DIAGNOSIS — Z01419 Encounter for gynecological examination (general) (routine) without abnormal findings: Secondary | ICD-10-CM | POA: Diagnosis not present

## 2022-07-06 DIAGNOSIS — R1032 Left lower quadrant pain: Secondary | ICD-10-CM

## 2022-07-06 DIAGNOSIS — R1012 Left upper quadrant pain: Secondary | ICD-10-CM | POA: Insufficient documentation

## 2022-07-06 MED ORDER — OMEPRAZOLE 20 MG PO CPDR: 20.0000 mg | DELAYED_RELEASE_CAPSULE | Freq: Every day | ORAL | 0 refills | Status: DC

## 2022-07-06 NOTE — Assessment & Plan Note (Signed)
Interested in establishing care with OBGYN.   Referral sent.

## 2022-07-06 NOTE — Assessment & Plan Note (Addendum)
Reports history of menorrhagia.   Reviewed labs from ED and see trend of low hemoglobin.   Will recheck CBC and Iron studies with Ferritin. Consider supplementation if levels are still low.   Referred to GYN.  I evaluated patient, was consulted regarding treatment, and agree with assessment and plan per Tinnie Gens, RN, DNP student.   Allie Bossier, NP-C

## 2022-07-06 NOTE — Assessment & Plan Note (Addendum)
Symptoms likely anxiety and/or GERD induced.  Reviewed prior ED labs, chest x-ray and CT scan with patient today.   Patient requested cardiology evaluation, this seems reasonable given her overall negative work up. Cardiology referral placed.   Will work to treat GERD. Follow up in 1 month for evaluation of anxiety.   I evaluated patient, was consulted regarding treatment, and agree with assessment and plan per Tinnie Gens, RN, DNP student.   Joann Bossier, NP-C

## 2022-07-06 NOTE — Progress Notes (Signed)
Established Patient Office Visit  Subjective   Patient ID: ARMENTA ERSKIN, female    DOB: February 27, 1991  Age: 32 y.o. MRN: [id number]  Chief Complaint  Patient presents with   Referral    To OBGYN   Pain    Pain to the left side, sharp shooting pain that is worse when she lays down at night. This has been going on for a couple months.     HPI  Theone M. Huckeba is 32 year female with past medical history of hypertension, sbstance abuse, anemia, GAD MDD presents today to discuss left sided abdominal pain.   Abdominal pain- Located on left sided abdominal pain started two months ago. She drank some water and lied down. Pain is intermittent. She describes the sharp, stabbing pain on the left middle abdominal side. Denies any trauma or injury. Denies any nausea or vomiting. Denies any acid reflux. Pain has resolved. She has tried ibuprofen and provides some temporary relief. She does have constipation. She does have a BM daily but she does strain to have a BM. She attributes that to her diet. There is no known family history of colon cancer, stomach cancer or rectal cancer. Has personal history of diverticulosis and kidney stone.   Stress/Anxiety: Patient stated that she is very stressed out. Stated that she feels like she is going to die soon. She says she has intermittent chest pressure which happens multiple times during the day. It comes and goes. No aggravating or relieving factors. She went to the ER for chest pain on 06/20/22 and her cardiac work up was negative. She denies any chest pain, pressure, jaw pain today. Pain is intermittent. Patient is frustrated because by the time she gets to the ER, pain has resolved. She is interested in Holter monitor testing with the cardiologist.  Patient Active Problem List   Diagnosis Date Noted   Intermittent chest pain 07/06/2022   Left upper quadrant abdominal pain 07/06/2022   Encounter for gynecological examination 07/06/2022   Left lower quadrant  abdominal pain 10/04/2021   GAD (generalized anxiety disorder) 10/10/2019   Moderate episode of recurrent major depressive disorder ([place]) 10/10/2019   Screening for STD (sexually transmitted disease) 10/10/2019   Postural dizziness with presyncope 11/04/2018   Palpitations 11/04/2018   Asymmetrical sensorineural hearing loss 03/11/2018   Tinnitus aurium, 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   Anemia 08/15/2016   Essential hypertension 07/20/2016   Substance abuse ([place]) 07/20/2016   Alcohol use complicating pregnancy in third trimester 10/07/2013   Past Medical History:  Diagnosis Date   Anemia    during first pregnancy   Back pain    Diverticulosis    Dizziness    recurrent   Ectopic pregnancy, tubal    Family history of mother as victim of domestic violence 07/20/2016   Per prenatal record in 2011, 2012   Heartburn in pregnancy    Herpes genitalia    doesn't think ever had outbreak   History of cesarean delivery 10/07/2013   Pregnancy induced hypertension    Seizures ([place])    Thyroid disease    Past Surgical History:  Procedure Laterality Date   CESAREAN SECTION  2009   CESAREAN SECTION N/A 10/07/2013   Procedure: CESAREAN SECTION;  Surgeon: Guss Bunde, MD;  Location: [facility];  Service: Obstetrics;  Laterality: N/A;   CESAREAN SECTION WITH BILATERAL TUBAL LIGATION N/A 08/19/2016   Procedure: CESAREAN SECTION;  Surgeon: Mora Bellman, MD;  Location: [facility];  Service: Obstetrics;  Laterality:  N/A;   DIAGNOSTIC LAPAROSCOPY WITH REMOVAL OF ECTOPIC PREGNANCY Left 08/02/2015   Procedure: DIAGNOSTIC LAPAROSCOPY WITH REMOVAL OF ECTOPIC PREGNANCY;  Surgeon: Mora Bellman, MD;  Location: [facility];  Service: Gynecology;  Laterality: Left;  for ectopic    TUBAL LIGATION Bilateral 08/19/2016   Procedure: BILATERAL TUBAL LIGATION;  Surgeon: Mora Bellman, MD;  Location: [facility];  Service: Obstetrics;  Laterality: Bilateral;   Social History   Tobacco Use   Smoking status: Former     Packs/day: 0.50    Types: Cigarettes    Quit date: 10/02/2018    Years since quitting: 3.7   Smokeless tobacco: Never  Vaping Use   Vaping Use: Never used  Substance Use Topics   Alcohol use: No   Drug use: No   Family History  Problem Relation Age of Onset   Diabetes Maternal Grandmother    Hypertension Maternal Grandmother    Heart attack Maternal Grandmother    No Known Allergies    Review of Systems  Constitutional:  Negative for chills, fever and weight loss.  Respiratory:  Negative for shortness of breath.   Cardiovascular:  Negative for chest pain and palpitations.  Gastrointestinal:  Positive for constipation. Negative for heartburn, nausea and vomiting.  Neurological:  Negative for dizziness and headaches.  Psychiatric/Behavioral:  The patient is nervous/anxious.       Objective:     BP 122/80   Pulse 82   Temp 99.9 F (37.7 C) (Temporal)   Ht 5\' 6"  (1.676 m)   Wt 261 lb (118.4 kg)   LMP 06/11/2022 (Approximate)   SpO2 95%   BMI 42.13 kg/m  BP Readings from Last 3 Encounters:  07/06/22 122/80  06/29/22 119/78  06/20/22 127/82   Wt Readings from Last 3 Encounters:  07/06/22 261 lb (118.4 kg)  06/20/22 250 lb (113.4 kg)  01/11/22 250 lb (113.4 kg)      Physical Exam Vitals and nursing note reviewed.  Constitutional:      Appearance: Normal appearance.  Cardiovascular:     Rate and Rhythm: Normal rate.     Pulses: Normal pulses.     Heart sounds: Normal heart sounds.  Pulmonary:     Effort: Pulmonary effort is normal.     Breath sounds: Normal breath sounds.  Abdominal:     General: Bowel sounds are normal.  Neurological:     Mental Status: She is alert and oriented to person, place, and time.  Psychiatric:     Comments: Teary eyed and anxious.      No results found for any visits on 07/06/22.     The ASCVD Risk score (Arnett DK, et al., 2019) failed to calculate for the following reasons:   The 2019 ASCVD risk score is only  valid for ages 33 to 27    Assessment & Plan:   Problem List Items Addressed This Visit       Other   Anemia    Reports history of menorrhagia.   Reviewed labs from ED and see trend of low hemoglobin.   Will recheck CBC and Iron studies with Ferritin. Consider supplementation if levels are still low.         Relevant Orders   CBC   IBC + Ferritin   Left lower quadrant abdominal pain    Reviewed ED notes, labs and imaging from the ED.   Unclear etiology. Given her symptoms, will check for H. Pylori. Breath test ordered.   Given her symptoms, will  start Omeprazole 20 mg before meal daily. Prescription sent. Discussed the importance of adherence to medication and management of symptoms.   Follow up in four weeks.      Intermittent chest pain - Primary    Reviewed labs, chest x-ray and CT scan with patient.   Patient is interested in going to see the cardiologist to discuss the symptoms and the intermittent chest pain. She would like to get the Holter monitor testing completed.   Due to the intermittent left sided chest pain, agree with patient, cardiology referral placed.       Relevant Medications   omeprazole (PRILOSEC) 20 MG capsule   Other Relevant Orders   Ambulatory referral to Cardiology   Left upper quadrant abdominal pain   Relevant Medications   omeprazole (PRILOSEC) 20 MG capsule   Other Relevant Orders   H. pylori breath test   Encounter for gynecological examination    Interested in establishing care with OBGYN.   Referral sent.       Relevant Orders   Ambulatory referral to Obstetrics / Gynecology    Return in about 4 weeks (around 08/03/2022) for Left sided abdominal pain.Tinnie Gens, BSN-RN, DNP STUDENT

## 2022-07-06 NOTE — Progress Notes (Signed)
Subjective:    Patient ID: Joann King, female    DOB: 08-10-1990, 32 y.o.   MRN: [id number]  HPI  Joann King is a very pleasant 32 y.o. female  has a past medical history of Anemia, Back pain, Diverticulosis, Dizziness, Ectopic pregnancy, tubal, Family history of mother as victim of domestic violence (07/20/2016), Heartburn in pregnancy, Herpes genitalia, History of cesarean delivery (10/07/2013), Pregnancy induced hypertension, Seizures ([place]), and Thyroid disease. who presents today for several concerns.   She presented to [facility] on 06/29/22 for STD testing after unprotected intercourse two weeks prior. STD testing at wet prep was performed which showed bacterial vaginitis only.   She would like to establish with an OB/GYN.  She also would like to discuss left upper quadrant abdominal pain that began about 2 months ago. Her pain is intermittent for which she descries as sharp and stabbing. This occurs after eating. Her pain will resolve with drinking water and laying down. She denies nausea, vomiting, diarrhea. She does notice constipation with firm stools for which she attributes to her diet. She's tried taking Ibuprofen.    She would also like to discuss chronic chest pain. Her chest pain is located to the left chest which is intermittent and a pressure. She doesn't feel like anyone listens to her, she experiences chronic left sided chest pain, has been evaluated in the ED several times and all testing has been negative. She's been under a lot of stress. She does experience esophageal burning 2-3 times weekly on average.   Review of Systems  Respiratory:  Negative for shortness of breath.   Cardiovascular:  Positive for chest pain.  Gastrointestinal:  Positive for abdominal pain and constipation. Negative for blood in stool, diarrhea, nausea and vomiting.       GERD  Genitourinary:  Negative for menstrual problem.  Psychiatric/Behavioral:  The patient is  nervous/anxious.          Past Medical History:  Diagnosis Date   Anemia    during first pregnancy   Back pain    Diverticulosis    Dizziness    recurrent   Ectopic pregnancy, tubal    Family history of mother as victim of domestic violence 07/20/2016   Per prenatal record in 2011, 2012   Heartburn in pregnancy    Herpes genitalia    doesn't think ever had outbreak   History of cesarean delivery 10/07/2013   Pregnancy induced hypertension    Seizures ([place])    Thyroid disease     Social History   Socioeconomic History   Marital status: Single    Spouse name: Not on file   Number of children: Not on file   Years of education: Not on file   Highest education level: Not on file  Occupational History   Not on file  Tobacco Use   Smoking status: Former    Packs/day: 0.50    Types: Cigarettes    Quit date: 10/02/2018    Years since quitting: 3.7   Smokeless tobacco: Never  Vaping Use   Vaping Use: Never used  Substance and Sexual Activity   Alcohol use: No   Drug use: No   Sexual activity: Not on file  Other Topics Concern   Not on file  Social History Narrative   Not on file   Social Determinants of Health   Financial Resource Strain: Not on file  Food Insecurity: Not on file  Transportation Needs: Not on file  Physical  Activity: Not on file  Stress: Not on file  Social Connections: Not on file  Intimate Partner Violence: Not on file    Past Surgical History:  Procedure Laterality Date   CESAREAN SECTION  2009   CESAREAN SECTION N/A 10/07/2013   Procedure: CESAREAN SECTION;  Surgeon: Lesly Dukes, MD;  Location: [facility];  Service: Obstetrics;  Laterality: N/A;   CESAREAN SECTION WITH BILATERAL TUBAL LIGATION N/A 08/19/2016   Procedure: CESAREAN SECTION;  Surgeon: Catalina Antigua, MD;  Location: [facility];  Service: Obstetrics;  Laterality: N/A;   DIAGNOSTIC LAPAROSCOPY WITH REMOVAL OF ECTOPIC PREGNANCY Left 08/02/2015   Procedure: DIAGNOSTIC  LAPAROSCOPY WITH REMOVAL OF ECTOPIC PREGNANCY;  Surgeon: Catalina Antigua, MD;  Location: [facility];  Service: Gynecology;  Laterality: Left;  for ectopic    TUBAL LIGATION Bilateral 08/19/2016   Procedure: BILATERAL TUBAL LIGATION;  Surgeon: Catalina Antigua, MD;  Location: [facility];  Service: Obstetrics;  Laterality: Bilateral;    Family History  Problem Relation Age of Onset   Diabetes Maternal Grandmother    Hypertension Maternal Grandmother    Heart attack Maternal Grandmother     No Known Allergies  Current Outpatient Medications on File Prior to Visit  Medication Sig Dispense Refill   ibuprofen (ADVIL) 800 MG tablet Take 1 tablet (800 mg total) by mouth every 8 (eight) hours as needed for mild pain. 30 tablet 0   [DISCONTINUED] sertraline (ZOLOFT) 50 MG tablet Take 1 tablet (50 mg total) by mouth daily. For anxiety and depression. (Patient not taking: Reported on 11/21/2019) 30 tablet 1   No current facility-administered medications on file prior to visit.    BP 122/80   Pulse 82   Temp 99.9 F (37.7 C) (Temporal)   Ht 5\' 6"  (1.676 m)   Wt 261 lb (118.4 kg)   LMP 06/11/2022 (Approximate)   SpO2 95%   BMI 42.13 kg/m  Objective:   Physical Exam Cardiovascular:     Rate and Rhythm: Normal rate and regular rhythm.  Pulmonary:     Effort: Pulmonary effort is normal.     Breath sounds: Normal breath sounds.  Abdominal:     General: Bowel sounds are normal.     Palpations: Abdomen is soft.     Tenderness: There is no abdominal tenderness.  Musculoskeletal:     Cervical back: Neck supple.  Skin:    General: Skin is warm and dry.  Neurological:     Mental Status: She is alert.  Psychiatric:     Comments: Tearful during visit            Assessment & Plan:  Intermittent chest pain Assessment & Plan: Symptoms likely anxiety and/or GERD induced.  Reviewed prior ED labs, chest x-ray and CT scan with patient today.   Patient requested cardiology evaluation,  this seems reasonable given her overall negative work up. Cardiology referral placed.   Will work to treat GERD. Follow up in 1 month for evaluation of anxiety.   I evaluated patient, was consulted regarding treatment, and agree with assessment and plan per 14/04/2022, RN, DNP student.   Modesto Charon, NP-C   Orders: -     Ambulatory referral to Cardiology -     Omeprazole; Take 1 capsule (20 mg total) by mouth daily. For heartburn.  Dispense: 90 capsule; Refill: 0  Left upper quadrant abdominal pain -     Omeprazole; Take 1 capsule (20 mg total) by mouth daily. For heartburn.  Dispense: 90  capsule; Refill: 0 -     H. pylori breath test  Encounter for gynecological examination Assessment & Plan: Interested in establishing care with OBGYN.   Referral sent.   Orders: -     Ambulatory referral to Obstetrics / Gynecology  Anemia, unspecified type Assessment & Plan: Reports history of menorrhagia.   Reviewed labs from ED and see trend of low hemoglobin.   Will recheck CBC and Iron studies with Ferritin. Consider supplementation if levels are still low.   Referred to GYN.  I evaluated patient, was consulted regarding treatment, and agree with assessment and plan per Tinnie Gens, RN, DNP student.   Allie Bossier, NP-C   Orders: -     CBC -     IBC + Ferritin  Left lower quadrant abdominal pain Assessment & Plan: Reviewed ED notes, labs and imaging from the ED.   Unclear etiology. Given her symptoms, will check for H. Pylori. Breath test ordered.   Given her symptoms, will start Omeprazole 20 mg before meal daily. Prescription sent. Discussed the importance of adherence to medication and management of symptoms.   Follow up in four weeks. Consider GI referral  I evaluated patient, was consulted regarding treatment, and agree with assessment and plan per Tinnie Gens, RN, DNP student.   Allie Bossier, NP-C          Pleas Koch, NP

## 2022-07-06 NOTE — Assessment & Plan Note (Addendum)
Reviewed ED notes, labs and imaging from the ED.   Unclear etiology. Given her symptoms, will check for H. Pylori. Breath test ordered.   Given her symptoms, will start Omeprazole 20 mg before meal daily. Prescription sent. Discussed the importance of adherence to medication and management of symptoms.   Follow up in four weeks. Consider GI referral  I evaluated patient, was consulted regarding treatment, and agree with assessment and plan per Tinnie Gens, RN, DNP student.   Allie Bossier, NP-C

## 2022-07-06 NOTE — Patient Instructions (Addendum)
Stop by the lab prior to leaving today. I will notify you of your results once received.    Start Omeprazole 20 mg once daily before meal.   Referral for OBGYN and Cardiology has been sent.   Follow up in 4 weeks.

## 2022-07-07 LAB — CBC
HCT: 35.9 % — ABNORMAL LOW (ref 36.0–46.0)
Hemoglobin: 11.9 g/dL — ABNORMAL LOW (ref 12.0–15.0)
MCHC: 33.2 g/dL (ref 30.0–36.0)
MCV: 87.6 fl (ref 78.0–100.0)
Platelets: 440 10*3/uL — ABNORMAL HIGH (ref 150.0–400.0)
RBC: 4.09 Mil/uL (ref 3.87–5.11)
RDW: 14.5 % (ref 11.5–15.5)
WBC: 8.2 10*3/uL (ref 4.0–10.5)

## 2022-07-07 LAB — IBC + FERRITIN
Ferritin: 13.6 ng/mL (ref 10.0–291.0)
Iron: 90 ug/dL (ref 42–145)
Saturation Ratios: 21.8 % (ref 20.0–50.0)
TIBC: 413 ug/dL (ref 250.0–450.0)
Transferrin: 295 mg/dL (ref 212.0–360.0)

## 2022-07-07 LAB — H. PYLORI BREATH TEST: H. pylori Breath Test: DETECTED — AB

## 2022-07-10 ENCOUNTER — Other Ambulatory Visit: Payer: Self-pay | Admitting: Primary Care

## 2022-07-10 ENCOUNTER — Telehealth: Payer: Self-pay | Admitting: Primary Care

## 2022-07-10 DIAGNOSIS — R079 Chest pain, unspecified: Secondary | ICD-10-CM

## 2022-07-10 DIAGNOSIS — A048 Other specified bacterial intestinal infections: Secondary | ICD-10-CM

## 2022-07-10 DIAGNOSIS — R1012 Left upper quadrant pain: Secondary | ICD-10-CM

## 2022-07-10 MED ORDER — METRONIDAZOLE 250 MG PO TABS: 250.0000 mg | ORAL_TABLET | Freq: Four times a day (QID) | ORAL | 0 refills | Status: DC

## 2022-07-10 MED ORDER — OMEPRAZOLE 20 MG PO CPDR: 20.0000 mg | DELAYED_RELEASE_CAPSULE | Freq: Two times a day (BID) | ORAL | 0 refills | Status: DC

## 2022-07-10 MED ORDER — BISMUTH SUBSALICYLATE 262 MG PO CHEW: 524.0000 mg | CHEWABLE_TABLET | Freq: Three times a day (TID) | ORAL | 0 refills | Status: AC

## 2022-07-10 MED ORDER — DOXYCYCLINE HYCLATE 100 MG PO CAPS: 100.0000 mg | ORAL_CAPSULE | Freq: Two times a day (BID) | ORAL | 0 refills | Status: DC

## 2022-07-10 NOTE — Telephone Encounter (Signed)
Patient has been advised Anda Kraft is out of the office today and will be logging on later this afternoon to send in medication.

## 2022-07-10 NOTE — Telephone Encounter (Signed)
Pt called in to know the status of medication . Would like it to be sent to [id number] - [place], [place]

## 2022-07-13 ENCOUNTER — Encounter: Payer: Medicaid Other | Admitting: Advanced Practice Midwife

## 2022-07-18 ENCOUNTER — Telehealth: Payer: Self-pay | Admitting: Primary Care

## 2022-07-18 DIAGNOSIS — A048 Other specified bacterial intestinal infections: Secondary | ICD-10-CM

## 2022-07-18 NOTE — Telephone Encounter (Signed)
Patient called and stated the medications omeprazole (PRILOSEC) 20 MG capsule , metroNIDAZOLE (FLAGYL) 250 MG tablet  are making her feel bad and having to have headaches and she is not being able to sleep at night but she stated they are helping with the infection in her stomach.

## 2022-07-18 NOTE — Telephone Encounter (Signed)
Pt was seen 07/06/22; pt thinks metronidazole 250 mg, omeprazole 20 mg doxycycline 100 mg was causing pt to have H/A on rt side of head and behind eye and causing pt to feel very tired; pt said she stopped taking all three meds on 07/16/22. Since stopping meds no H/As, no feeling tired. Pt wants to know what other med she could take in substitution for these meds. Pt started meds on 07/11/22 as instructed And after taking meds had H/A, feeling tired and just not feeling "good". Pt does not  have a way to ck BP. No fever, no N&V or diarrhea.No dizziness and pt said on and off mid chest pressure that is the same as when seen on 07/06/22; no CP now.Now abd pain is not as bad as when seen on 07/06/22. Pt still has sharp pain on and off on upper lt side. Pt request cb after reviewed by Gentry Fitz NP to see if there is another route pt can take to get rid of abd pain.[facility]. UC & ED precautions given and pt voiced understanding. Sending note to Gentry Fitz NP and [employer].

## 2022-07-19 MED ORDER — AMOXICILLIN 500 MG PO CAPS: 1000.0000 mg | ORAL_CAPSULE | Freq: Two times a day (BID) | ORAL | 0 refills | Status: AC

## 2022-07-19 MED ORDER — CLARITHROMYCIN 500 MG PO TABS: 500.0000 mg | ORAL_TABLET | Freq: Two times a day (BID) | ORAL | 0 refills | Status: DC

## 2022-07-19 NOTE — Telephone Encounter (Signed)
Called and notified patient of Joann King message, she will pick up new prescriptions and let us know if she is experiencing any side effects with these.

## 2022-07-19 NOTE — Telephone Encounter (Signed)
Please call patient:  I'm sorry to hear about her side effects.  Here is a new regimen to try, I will send this to her pharmacy:  Amoxicillin: 2 tablet BID x 14 days  Clarithromycin: 1 tablet BID x 14 days  Omeprazole: Continue 20 mg BID x 14 days, she should already have this.  Have her try to take this regimen. It can help eradicate her stomach and lower chest symptoms.

## 2022-07-19 NOTE — Addendum Note (Signed)
Addended by: Pleas Koch on: 07/19/2022 03:55 PM   Modules accepted: Orders

## 2022-08-03 ENCOUNTER — Ambulatory Visit: Payer: Medicaid Other | Admitting: Primary Care

## 2022-08-11 ENCOUNTER — Ambulatory Visit: Payer: Medicaid Other | Admitting: Primary Care

## 2022-08-11 ENCOUNTER — Encounter: Payer: Self-pay | Admitting: Primary Care

## 2022-08-21 ENCOUNTER — Ambulatory Visit: Payer: Medicaid Other | Admitting: Cardiology

## 2022-08-22 ENCOUNTER — Encounter: Payer: Self-pay | Admitting: Primary Care

## 2022-08-22 ENCOUNTER — Ambulatory Visit: Payer: Medicaid Other | Admitting: Primary Care

## 2022-08-30 ENCOUNTER — Ambulatory Visit (INDEPENDENT_AMBULATORY_CARE_PROVIDER_SITE_OTHER): Payer: Medicaid Other | Admitting: Primary Care

## 2022-08-30 ENCOUNTER — Encounter: Payer: Self-pay | Admitting: Primary Care

## 2022-08-30 VITALS — BP 112/76 | HR 79 | Temp 98.1°F | Ht 66.0 in | Wt 259.0 lb

## 2022-08-30 DIAGNOSIS — A048 Other specified bacterial intestinal infections: Secondary | ICD-10-CM

## 2022-08-30 HISTORY — DX: Other specified bacterial intestinal infections: A04.8

## 2022-08-30 NOTE — Patient Instructions (Signed)
Stop by the lab prior to leaving today. I will notify you of your results once received.   It was a pleasure to see you today!   
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
## 2022-08-30 NOTE — Assessment & Plan Note (Addendum)
Intolerable to both treatment regimens.   Symptoms and presentation suggestive improving.   Will recheck the H.Pylori breath test.   If positive, consider GI referral if needed. Await results.  Consider resuming omeprazole for GERD.  I evaluated patient, was consulted regarding treatment, and agree with assessment and plan per Tinnie Gens, RN, DNP student.   Allie Bossier, NP-C
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
## 2022-09-01 LAB — H. PYLORI BREATH TEST: H. pylori Breath Test: DETECTED — AB

## 2022-09-04 ENCOUNTER — Other Ambulatory Visit: Payer: Self-pay | Admitting: Primary Care

## 2022-09-04 DIAGNOSIS — A048 Other specified bacterial intestinal infections: Secondary | ICD-10-CM

## 2022-09-04 MED ORDER — DOXYCYCLINE HYCLATE 100 MG PO CAPS: 100.0000 mg | ORAL_CAPSULE | Freq: Two times a day (BID) | ORAL | 0 refills | Status: AC

## 2022-09-04 MED ORDER — OMEPRAZOLE 20 MG PO CPDR: 20.0000 mg | DELAYED_RELEASE_CAPSULE | Freq: Two times a day (BID) | ORAL | 0 refills | Status: DC

## 2022-09-06 ENCOUNTER — Other Ambulatory Visit: Payer: Self-pay

## 2022-09-06 ENCOUNTER — Encounter: Payer: Self-pay | Admitting: Emergency Medicine

## 2022-09-06 ENCOUNTER — Emergency Department
Admission: EM | Admit: 2022-09-06 | Discharge: 2022-09-06 | Disposition: A | Discharge status: Home / Self Care | Payer: Medicaid Other | Attending: Emergency Medicine | Admitting: Emergency Medicine

## 2022-09-06 DIAGNOSIS — Z202 Contact with and (suspected) exposure to infections with a predominantly sexual mode of transmission: Secondary | ICD-10-CM | POA: Insufficient documentation

## 2022-09-06 DIAGNOSIS — R110 Nausea: Secondary | ICD-10-CM | POA: Diagnosis present

## 2022-09-06 DIAGNOSIS — Z1152 Encounter for screening for COVID-19: Secondary | ICD-10-CM | POA: Diagnosis not present

## 2022-09-06 LAB — URINALYSIS, ROUTINE W REFLEX MICROSCOPIC
Bilirubin Urine: NEGATIVE
Glucose, UA: NEGATIVE mg/dL
Ketones, ur: NEGATIVE mg/dL
Nitrite: NEGATIVE
Protein, ur: NEGATIVE mg/dL
Specific Gravity, Urine: 1.001 — ABNORMAL LOW (ref 1.005–1.030)
pH: 6 (ref 5.0–8.0)

## 2022-09-06 LAB — PREGNANCY, URINE: Preg Test, Ur: NEGATIVE

## 2022-09-06 LAB — WET PREP, GENITAL
Clue Cells Wet Prep HPF POC: NONE SEEN
Sperm: NONE SEEN
Trich, Wet Prep: NONE SEEN
WBC, Wet Prep HPF POC: <10 (ref <10)
Yeast Wet Prep HPF POC: NONE SEEN

## 2022-09-06 LAB — RESP PANEL BY RT-PCR (RSV, FLU A&B, COVID)  RVPGX2
Influenza A by PCR: NEGATIVE
Influenza B by PCR: NEGATIVE
Resp Syncytial Virus by PCR: NEGATIVE
SARS Coronavirus 2 by RT PCR: NEGATIVE

## 2022-09-06 LAB — CHLAMYDIA/NGC RT PCR (ARMC ONLY)
Chlamydia Tr: NOT DETECTED
N gonorrhoeae: NOT DETECTED

## 2022-09-06 MED ORDER — ALBUTEROL SULFATE (2.5 MG/3ML) 0.083% IN NEBU: 2.5000 mg | INHALATION_SOLUTION | Freq: Four times a day (QID) | RESPIRATORY_TRACT | 0 refills | Status: DC | PRN

## 2022-09-06 MED ORDER — ONDANSETRON 4 MG PO TBDP
4.0000 mg | ORAL_TABLET | Freq: Once | ORAL | Status: AC
  Administered 2022-09-06: 4 mg via ORAL
  Filled 2022-09-06: qty 1

## 2022-09-06 MED ORDER — ONDANSETRON 4 MG PO TBDP: 4.0000 mg | ORAL_TABLET | Freq: Three times a day (TID) | ORAL | 0 refills | Status: DC | PRN

## 2022-09-06 NOTE — ED Triage Notes (Signed)
Patient to ED for STD testing. Patient states she has had nausea and been going to the bathroom more. New sexual partner. No burning or itching. Currently taking antibiotic for stomach ulcer.

## 2022-09-06 NOTE — ED Notes (Signed)
Pt provided swabs for self collection.

## 2022-09-06 NOTE — ED Provider Notes (Signed)
[facility] Emergency Department Provider Note     Event Date/Time   First MD Initiated Contact with Patient 09/06/22 1858     (approximate)   History   Exposure to STD   HPI  Joann King is a 32 y.o. female with a history of hypertension, GAD, H. pylori, and anemia, presents to the ED with a 2-day complaint of intermittent nausea without vomiting, malaise, and diarrhea.  She denies any vaginal discharge, irregular vaginal bleeding, or pelvic pain.  She was concerned for possible STD given her new sexual partner encounter.  She is currently taking antibiotic for a stomach ulcer.  Patient reports her LMP was last week and just ended yesterday.  Physical Exam   Triage Vital Signs: ED Triage Vitals  Enc Vitals Group     BP 09/06/22 1814 131/83     Pulse Rate 09/06/22 1814 75     Resp 09/06/22 1814 18     Temp 09/06/22 1812 97.7 F (36.5 C)     Temp Source 09/06/22 1812 Oral     SpO2 09/06/22 1814 96 %     Weight --      Height --      Head Circumference --      Peak Flow --      Pain Score 09/06/22 1813 0     Pain Loc --      Pain Edu? --      Excl. in [place]? --     Most recent vital signs: Vitals:   09/06/22 1812 09/06/22 1814  BP:  131/83  Pulse:  75  Resp:  18  Temp: 97.7 F (36.5 C)   SpO2:  96%    General Awake, no distress. NAD CV:  Good peripheral perfusion.  RESP:  Normal effort.  ABD:  No distention.  Nontender.  Normal bowel sounds noted.   ED Results / Procedures / Treatments   Labs (all labs ordered are listed, but only abnormal results are displayed) Labs Reviewed  URINALYSIS, ROUTINE W REFLEX MICROSCOPIC - Abnormal; Notable for the following components:      Result Value   Color, Urine YELLOW (*)    APPearance CLOUDY (*)    Specific Gravity, Urine 1.001 (*)    Hgb urine dipstick LARGE (*)    Leukocytes,Ua TRACE (*)    Bacteria, UA RARE (*)    All other components within normal limits  CHLAMYDIA/NGC RT PCR  ([facility] ONLY)            WET PREP, GENITAL  RESP PANEL BY RT-PCR (RSV, FLU A&B, COVID)  [id number]  PREGNANCY, URINE     EKG   RADIOLOGY  No results found.   PROCEDURES:  Critical Care performed: No  Procedures   MEDICATIONS ORDERED IN ED: Medications  ondansetron (ZOFRAN-ODT) disintegrating tablet 4 mg (4 mg Oral Given 09/06/22 1952)     IMPRESSION / MDM / ASSESSMENT AND PLAN / ED COURSE  I reviewed the triage vital signs and the nursing notes.                              Differential diagnosis includes, but is not limited to, ovarian cyst, ovarian torsion, acute appendicitis, diverticulitis, urinary tract infection/pyelonephritis, bowel obstruction, colitis, renal colic, gastroenteritis, hernia, fibroids, endometriosis, pregnancy related pain including ectopic pregnancy, etc.  Patient's presentation is most consistent with acute complicated illness / injury requiring diagnostic workup.  Patient's  diagnosis is consistent with likely viral etiology versus adverse medication reaction.  No evidence of acute pelvic infection or viral URI.  Patient will be discharged home with prescriptions for Zofran. Patient is to follow up with primary provider as needed or otherwise directed. Patient is given ED precautions to return to the ED for any worsening or new symptoms.   FINAL CLINICAL IMPRESSION(S) / ED DIAGNOSES   Final diagnoses:  Possible exposure to STD  Nausea without vomiting     Rx / DC Orders   ED Discharge Orders          Ordered    albuterol (PROVENTIL) (2.5 MG/3ML) 0.083% nebulizer solution  Every 6 hours PRN,   Status:  Discontinued        09/06/22 2017    ondansetron (ZOFRAN-ODT) 4 MG disintegrating tablet  Every 8 hours PRN        09/06/22 2130             Note:  This document was prepared using Dragon voice recognition software and may include unintentional dictation errors.    Melvenia Needles, PA-C 09/06/22 Joie Bimler    Delman Kitten,  MD 09/07/22 1253

## 2022-09-06 NOTE — Discharge Instructions (Addendum)
Take the prescription nausea medicine as directed. Continue with the previously prescribed antibiotic and acid reducer.

## 2022-09-07 ENCOUNTER — Telehealth: Payer: Self-pay

## 2022-09-07 NOTE — Transitions of Care (Post Inpatient/ED Visit) (Signed)
   09/07/2022  Name: Joann King MRN: [id number] DOB: [birth year]  Today's TOC FU Call Status:    Attempted to reach the patient regarding the most recent Inpatient/ED visit.  Follow Up Plan: Additional outreach attempts will be made to reach the patient to complete the Transitions of Care (Post Inpatient/ED visit) call.   Dickey LPN [place] Advisor Direct Dial [phone]

## 2022-09-11 NOTE — Transitions of Care (Post Inpatient/ED Visit) (Signed)
   09/11/2022  Name: Joann King MRN: [id number] DOB: [birth year]  Today's TOC FU Call Status: Today's TOC FU Call Status:: Unsuccessful Call (2nd Attempt) Unsuccessful Call (1st Attempt) Date: 09/07/22 Unsuccessful Call (2nd Attempt) Date: 09/11/22  Attempted to reach the patient regarding the most recent Inpatient/ED visit.  Follow Up Plan: No further outreach attempts will be made at this time. We have been unable to contact the patient.  [place] LPN [place] Advisor Direct Dial [phone]

## 2022-09-13 ENCOUNTER — Telehealth: Payer: Self-pay | Admitting: Primary Care

## 2022-09-13 DIAGNOSIS — A048 Other specified bacterial intestinal infections: Secondary | ICD-10-CM

## 2022-09-13 NOTE — Telephone Encounter (Signed)
Patient called back in, she is taking both medications as discussed and directed.  She would prefer the referral to be sent to [facility].

## 2022-09-13 NOTE — Telephone Encounter (Signed)
See phone note, patient called in to the office.

## 2022-09-13 NOTE — Telephone Encounter (Signed)
Patient called in and stated that she is still having pain in her stomach from the ulcer. She stated she is still treating it and was wanting to know if she could be referred out to a gastroenterologist. She stated that the pain is at a 7 on a scale of 1-10. Please advise. Thank you!

## 2022-09-13 NOTE — Telephone Encounter (Signed)
Please call patient:  Did she take the doxycycline antibiotic and omeprazole as discussed? Yes, we can get her in with GI. Which location? [place] or [place]?

## 2022-09-13 NOTE — Telephone Encounter (Signed)
Unable to reach patient. Left voicemail to return call to our office.   

## 2022-09-14 NOTE — Telephone Encounter (Signed)
Noted.  Referral sent to GI. I will try to expedite since she has symptomatic H pylori with intolerance to treatment.

## 2022-09-14 NOTE — Addendum Note (Signed)
Addended by: Pleas Koch on: 09/14/2022 07:43 AM   Modules accepted: Orders

## 2022-09-18 ENCOUNTER — Ambulatory Visit (INDEPENDENT_AMBULATORY_CARE_PROVIDER_SITE_OTHER): Payer: Medicaid Other | Admitting: Obstetrics & Gynecology

## 2022-09-18 ENCOUNTER — Encounter: Payer: Self-pay | Admitting: Obstetrics & Gynecology

## 2022-09-18 ENCOUNTER — Other Ambulatory Visit (HOSPITAL_COMMUNITY)
Admission: RE | Admit: 2022-09-18 | Discharge: 2022-09-18 | Disposition: A | Discharge status: Home / Self Care | Payer: Medicaid Other | Source: Ambulatory Visit | Attending: Advanced Practice Midwife | Admitting: Advanced Practice Midwife

## 2022-09-18 VITALS — BP 133/84 | HR 92 | Ht 66.0 in | Wt 260.0 lb

## 2022-09-18 DIAGNOSIS — N898 Other specified noninflammatory disorders of vagina: Secondary | ICD-10-CM

## 2022-09-18 DIAGNOSIS — Z113 Encounter for screening for infections with a predominantly sexual mode of transmission: Secondary | ICD-10-CM | POA: Diagnosis present

## 2022-09-18 DIAGNOSIS — A5901 Trichomonal vulvovaginitis: Secondary | ICD-10-CM

## 2022-09-18 DIAGNOSIS — B3731 Acute candidiasis of vulva and vagina: Secondary | ICD-10-CM | POA: Diagnosis not present

## 2022-09-18 DIAGNOSIS — Z01419 Encounter for gynecological examination (general) (routine) without abnormal findings: Secondary | ICD-10-CM | POA: Diagnosis present

## 2022-09-18 HISTORY — DX: Trichomonal vulvovaginitis: A59.01
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
## 2022-09-18 NOTE — Patient Instructions (Signed)
In Vitro Fertilization and Tubal ligation Reversal are done by [facility] (not covered by your current insurance)

## 2022-09-19 LAB — RPR+HBSAG+HCVAB+...
HIV Screen 4th Generation wRfx: NONREACTIVE
Hep C Virus Ab: NONREACTIVE
Hepatitis B Surface Ag: NEGATIVE
RPR Ser Ql: NONREACTIVE

## 2022-09-20 LAB — CERVICOVAGINAL ANCILLARY ONLY
Bacterial Vaginitis (gardnerella): NEGATIVE
Candida Glabrata: NEGATIVE
Candida Vaginitis: POSITIVE — AB
Comment: NEGATIVE
Comment: NEGATIVE
Comment: NEGATIVE

## 2022-09-20 MED ORDER — FLUCONAZOLE 150 MG PO TABS: 150.0000 mg | ORAL_TABLET | Freq: Once | ORAL | 3 refills | Status: AC

## 2022-09-20 NOTE — Addendum Note (Signed)
Addended by: Verita Schneiders A on: 09/20/2022 05:03 PM   Modules accepted: Orders

## 2022-09-21 ENCOUNTER — Telehealth: Payer: Self-pay | Admitting: Primary Care

## 2022-09-21 ENCOUNTER — Telehealth: Payer: Self-pay | Admitting: Gastroenterology

## 2022-09-21 NOTE — Telephone Encounter (Signed)
Called and spoke with patient she states she has 1 day left on the medication and she can tell a slight improvement in the stomach pain, but it I still present and very bothersome to her. GI called 03/19 and left her a VM to call back and schedule. She has yet to call them back and scheduled appt. Encouraged to give them a call back asap to be scheduled

## 2022-09-21 NOTE — Telephone Encounter (Signed)
Sched appointment when June opens up

## 2022-09-21 NOTE — Telephone Encounter (Signed)
Noted. Yes, recommend she contact GI ASAP!

## 2022-09-21 NOTE — Telephone Encounter (Signed)
Please check on patient.  How is her stomach pain? Has she heard from GI appointment yet?

## 2022-09-22 ENCOUNTER — Encounter: Payer: Self-pay | Admitting: Obstetrics & Gynecology

## 2022-09-22 LAB — CYTOLOGY - PAP
Chlamydia: NEGATIVE
Comment: NEGATIVE
Comment: NEGATIVE
Comment: NEGATIVE
Comment: NORMAL
Diagnosis: NEGATIVE
High risk HPV: NEGATIVE
Neisseria Gonorrhea: NEGATIVE
Trichomonas: POSITIVE — AB

## 2022-09-22 MED ORDER — METRONIDAZOLE 500 MG PO TABS: 500.0000 mg | ORAL_TABLET | Freq: Two times a day (BID) | ORAL | 0 refills | Status: AC

## 2022-09-22 NOTE — Progress Notes (Signed)
Normal pap smear and negative high-risk HPV. Continue cervical cancer screening as recommended.  The recommended interval is every three to five years for normal pap smears.  Patient has trichomonal vaginitis.  Negative testing for other STIs.  Patient was called at home on the phone, verified two patient identifiers, and discussed results with her.  Recommended that she needs to let partner(s) know so the partner(s) can get testing and treatment. Patient and sex partner(s) should abstain from unprotected sexual activity for two weeks after everyone receives appropriate treatment.  Metronidazole was prescribed for patient.  Patient can return in about 4 weeks after treatment for repeat test of cure. She was advised to pick up prescription and take as directed and to practice safe sex at all times.   Verita Schneiders, MD

## 2022-09-22 NOTE — Addendum Note (Signed)
Addended by: Verita Schneiders A on: 09/22/2022 02:32 PM   Modules accepted: Orders

## 2022-09-29 ENCOUNTER — Other Ambulatory Visit: Payer: Self-pay | Admitting: Primary Care

## 2022-09-29 DIAGNOSIS — A048 Other specified bacterial intestinal infections: Secondary | ICD-10-CM

## 2022-10-03 ENCOUNTER — Other Ambulatory Visit: Payer: Self-pay

## 2022-10-03 ENCOUNTER — Emergency Department
Admission: EM | Admit: 2022-10-03 | Discharge: 2022-10-03 | Disposition: A | Discharge status: Home / Self Care | Payer: Medicaid Other | Attending: Emergency Medicine | Admitting: Emergency Medicine

## 2022-10-03 DIAGNOSIS — M545 Low back pain, unspecified: Secondary | ICD-10-CM | POA: Diagnosis present

## 2022-10-03 DIAGNOSIS — X509XXA Other and unspecified overexertion or strenuous movements or postures, initial encounter: Secondary | ICD-10-CM | POA: Diagnosis not present

## 2022-10-03 DIAGNOSIS — S39012A Strain of muscle, fascia and tendon of lower back, initial encounter: Secondary | ICD-10-CM | POA: Insufficient documentation

## 2022-10-03 DIAGNOSIS — I10 Essential (primary) hypertension: Secondary | ICD-10-CM | POA: Diagnosis not present

## 2022-10-03 LAB — URINALYSIS, ROUTINE W REFLEX MICROSCOPIC
Bilirubin Urine: NEGATIVE
Glucose, UA: NEGATIVE mg/dL
Hgb urine dipstick: NEGATIVE
Ketones, ur: NEGATIVE mg/dL
Leukocytes,Ua: NEGATIVE
Nitrite: NEGATIVE
Protein, ur: NEGATIVE mg/dL
Specific Gravity, Urine: 1.01 (ref 1.005–1.030)
pH: 5 (ref 5.0–8.0)

## 2022-10-03 LAB — BASIC METABOLIC PANEL
Anion gap: 8 (ref 5–15)
BUN: 12 mg/dL (ref 6–20)
CO2: 22 mmol/L (ref 22–32)
Calcium: 8.9 mg/dL (ref 8.9–10.3)
Chloride: 104 mmol/L (ref 98–111)
Creatinine, Ser: 0.91 mg/dL (ref 0.44–1.00)
GFR, Estimated: >60 mL/min (ref >60)
Glucose, Bld: 93 mg/dL (ref 70–99)
Potassium: 3.8 mmol/L (ref 3.5–5.1)
Sodium: 134 mmol/L — ABNORMAL LOW (ref 135–145)

## 2022-10-03 LAB — LIPASE, BLOOD: Lipase: 34 U/L (ref 11–51)

## 2022-10-03 LAB — CBC
HCT: 36.2 % (ref 36.0–46.0)
Hemoglobin: 11.6 g/dL — ABNORMAL LOW (ref 12.0–15.0)
MCH: 28.5 pg (ref 26.0–34.0)
MCHC: 32 g/dL (ref 30.0–36.0)
MCV: 88.9 fL (ref 80.0–100.0)
Platelets: 419 10*3/uL — ABNORMAL HIGH (ref 150–400)
RBC: 4.07 MIL/uL (ref 3.87–5.11)
RDW: 13.5 % (ref 11.5–15.5)
WBC: 7.5 10*3/uL (ref 4.0–10.5)
nRBC: 0 % (ref 0.0–0.2)

## 2022-10-03 LAB — POC URINE PREG, ED: Preg Test, Ur: NEGATIVE

## 2022-10-03 MED ORDER — IBUPROFEN 800 MG PO TABS: 800.0000 mg | ORAL_TABLET | Freq: Three times a day (TID) | ORAL | 0 refills | Status: DC | PRN

## 2022-10-03 MED ORDER — METHOCARBAMOL 500 MG PO TABS: 500.0000 mg | ORAL_TABLET | Freq: Three times a day (TID) | ORAL | 0 refills | Status: DC | PRN

## 2022-10-03 MED ORDER — IBUPROFEN 800 MG PO TABS: 800.0000 mg | ORAL_TABLET | Freq: Three times a day (TID) | ORAL | 0 refills | Status: AC | PRN

## 2022-10-03 MED ORDER — LIDOCAINE 5 % EX PTCH: 1.0000 | MEDICATED_PATCH | Freq: Two times a day (BID) | CUTANEOUS | 0 refills | Status: AC

## 2022-10-03 MED ORDER — LIDOCAINE 5 % EX PTCH
1.0000 | MEDICATED_PATCH | CUTANEOUS | Status: DC
  Administered 2022-10-03: 1 via TRANSDERMAL
  Filled 2022-10-03: qty 1

## 2022-10-03 MED ORDER — KETOROLAC TROMETHAMINE 30 MG/ML IJ SOLN
60.0000 mg | Freq: Once | INTRAMUSCULAR | Status: AC
  Administered 2022-10-03: 60 mg via INTRAMUSCULAR
  Filled 2022-10-03: qty 2
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
## 2022-10-03 NOTE — ED Notes (Signed)
Patient unable to provide urine sample at this time

## 2022-10-03 NOTE — ED Notes (Signed)
Poct pregnancy Negative 

## 2022-10-03 NOTE — Discharge Instructions (Addendum)
You may alternate Tylenol 1000 mg every 6 hours as needed for pain, fever and Ibuprofen 800 mg every 6-8 hours as needed for pain, fever.  Please take Ibuprofen with food.  Do not take more than 4000 mg of Tylenol (acetaminophen) in a 24 hour period.   You may alternate heat and ice to your back.  I recommend rest and no heavy lifting over 5 pounds for the next 3 days.

## 2022-10-03 NOTE — ED Triage Notes (Signed)
Patient reports nausea and dizziness this morning. States she began having bilateral flank pain that radiates to hips this afternoon. Denies urinary symptoms. Resp even, unlabored on RA. Ambulatory.

## 2022-10-04 ENCOUNTER — Telehealth: Payer: Self-pay

## 2022-10-04 ENCOUNTER — Ambulatory Visit: Payer: Medicaid Other | Admitting: Family Medicine

## 2022-10-04 NOTE — Progress Notes (Deleted)
    Joann Mutchler T. Ziere Docken, MD, [name], [address]  Phone: [phone]  FAX: [place] - 32 y.o. female  MRN [id number]  Date of Birth: [birth year]  Date: 10/04/2022  PCP: Pleas Koch, NP  Referral: Pleas Koch, NP  No chief complaint on file.  Subjective:   Joann King is a 32 y.o. very pleasant female patient with There is no height or weight on file to calculate BMI. who presents with the following:  Patient presents with acute low back pain.  She actually was seen yesterday in the emergency room.  She was given a shot of Toradol as well as put on lidocaine 5% patch on in the ER.  She also was discharged on ibuprofen 800 mg as well as methocarbamol.  She presents today in follow-up after her ER visit.    Review of Systems is noted in the HPI, as appropriate  Objective:   LMP 09/04/2022 (Exact Date)   GEN: No acute distress; alert,appropriate. PULM: Breathing comfortably in no respiratory distress PSYCH: Normally interactive.   Laboratory and Imaging Data:  Assessment and Plan:   ***

## 2022-10-04 NOTE — Transitions of Care (Post Inpatient/ED Visit) (Signed)
   10/04/2022  Name: Joann King MRN: [id number] DOB: 04-12-1991  Today's TOC FU Call Status: Today's TOC FU Call Status:: Unsuccessul Call (1st Attempt) Unsuccessful Call (1st Attempt) Date: 10/04/22  Attempted to reach the patient regarding the most recent Inpatient/ED visit.  Follow Up Plan: Additional outreach attempts will be made to reach the patient to complete the Transitions of Care (Post Inpatient/ED visit) call.   Norton Blizzard, [place] ([facility])  [place] [facility] [phone]

## 2022-10-04 NOTE — Progress Notes (Signed)
Doriann Zuch T. Nattaly Yebra, MD, CAQ Sports Medicine [facility] at [facility] [address] [place], [address]  Phone: [phone]  FAX: [phone]  OPEYEMI NEDROW - 32 y.o. female  MRN [id number]  Date of Birth: 1990/10/22  Date: 10/05/2022  PCP: Doreene Nest, NP  Referral: Doreene Nest, NP  Chief Complaint  Patient presents with   Back Pain    Seen in ED 10/03/22   Subjective:   Joann King is a 32 y.o. very pleasant female patient with Body mass index is 42.05 kg/m. who presents with the following:  Patient presents with acute low back pain.  She actually was seen 2 days ago in the emergency room.  She was given a shot of Toradol as well as put on lidocaine 5% patch on in the ER.  She also was discharged on ibuprofen 800 mg as well as methocarbamol.  She presents today in follow-up after her ER visit.  Now all on the left side.  Has been having some pain even before, and she has had some relatively mild back pain going on for roughly 2 months.  This is distinctly different, however and it is much more severe compared to what she has had in her prior baseline.   But a bit better than a few days ago.   She is currently taking Robaxin as well as a 5% lidocaine patch.  She has also been taking some ibuprofen 800 mg tablets.  Review of Systems is noted in the HPI, as appropriate  Objective:   BP 100/68   Pulse 74   Temp 98 F (36.7 C) (Temporal)   Ht 5\' 6"  (1.676 m)   Wt 260 lb 8 oz (118.2 kg)   LMP 10/03/2022 (Exact Date)   BMI 42.05 kg/m    Range of motion at  the waist: Flexion, extension, lateral bending and rotation: Forward flexion is limited to 60 degrees.  Extension is preserved.  Lateral bending and rotation maneuvers are preserved  No echymosis or edema Rises to examination table with mild difficulty Gait: minimally antalgic  Inspection/Deformity: N Paraspinus Tenderness: L3-S1 bilaterally  B Ankle Dorsiflexion  (L5,4): 5/5 B Great Toe Dorsiflexion (L5,4): 5/5 Heel Walk (L5): WNL Toe Walk (S1): WNL Rise/Squat (L4): WNL, mild pain  SENSORY B Medial Foot (L4): WNL B Dorsum (L5): WNL B Lateral (S1): WNL Light Touch: WNL  REFLEXES Knee (L4): 2+ Ankle (S1): 2+  B SLR, seated: neg B SLR, supine: Focal back pain B FABER: neg B Reverse FABER: neg B Greater Troch: NT B Log Roll: neg B Sciatic Notch: NT   Laboratory and Imaging Data:  Assessment and Plan:     ICD-10-CM   1. Acute bilateral low back pain without sciatica  M54.50      While her pain is relatively severe, thankfully she is somewhat improved compared to 2 days ago.  Given her severe pain, I am going to place her on some prednisone and have her stop her ibuprofen for now.  I think it is also reasonable to continue her methocarbamol as well as lidocaine patches.  From a chronic standpoint, we also talked about the importance of weight loss as pressure mechanism on the low back.  If symptoms persist, I will start her on some formal physical therapy  Medication Management during today's office visit: Meds ordered this encounter  Medications   predniSONE (DELTASONE) 20 MG tablet    Sig: 2 tabs po daily for  5 days, then 1 tab po daily for 5 days    Dispense:  15 tablet    Refill:  0   Medications Discontinued During This Encounter  Medication Reason   omeprazole (PRILOSEC) 20 MG capsule Completed Course    Orders placed today for conditions managed today: No orders of the defined types were placed in this encounter.   Disposition: No follow-ups on file.  Dragon [employer] One speech-to-text software was used for transcription in this dictation.  Possible transcriptional errors can occur using Animal nutritionist.   Signed,  Elpidio Galea. Vinicius Brockman, MD   Outpatient Encounter Medications as of 10/05/2022  Medication Sig   ondansetron (ZOFRAN-ODT) 4 MG disintegrating tablet Take 1 tablet (4 mg total) by mouth every 8 (eight)  hours as needed for nausea or vomiting.   predniSONE (DELTASONE) 20 MG tablet 2 tabs po daily for 5 days, then 1 tab po daily for 5 days   ibuprofen (ADVIL) 800 MG tablet Take 1 tablet (800 mg total) by mouth every 8 (eight) hours as needed.   lidocaine (LIDODERM) 5 % Place 1 patch onto the skin every 12 (twelve) hours. Remove & Discard patch within 12 hours or as directed by MD   methocarbamol (ROBAXIN) 500 MG tablet Take 1 tablet (500 mg total) by mouth every 8 (eight) hours as needed for muscle spasms.   [DISCONTINUED] omeprazole (PRILOSEC) 20 MG capsule Take 1 capsule (20 mg total) by mouth 2 (two) times daily before a meal. For stomach infection   [DISCONTINUED] sertraline (ZOLOFT) 50 MG tablet Take 1 tablet (50 mg total) by mouth daily. For anxiety and depression. (Patient not taking: Reported on 11/21/2019)   No facility-administered encounter medications on file as of 10/05/2022.

## 2022-10-05 ENCOUNTER — Encounter: Payer: Self-pay | Admitting: Primary Care

## 2022-10-05 ENCOUNTER — Ambulatory Visit (INDEPENDENT_AMBULATORY_CARE_PROVIDER_SITE_OTHER): Payer: Medicaid Other | Admitting: Family Medicine

## 2022-10-05 ENCOUNTER — Encounter: Payer: Self-pay | Admitting: Family Medicine

## 2022-10-05 VITALS — BP 100/68 | HR 74 | Temp 98.0°F | Ht 66.0 in | Wt 260.5 lb

## 2022-10-05 DIAGNOSIS — M545 Low back pain, unspecified: Secondary | ICD-10-CM

## 2022-10-05 MED ORDER — PREDNISONE 20 MG PO TABS: ORAL_TABLET | ORAL | 0 refills | Status: DC

## 2022-10-05 NOTE — Transitions of Care (Post Inpatient/ED Visit) (Signed)
   10/05/2022  Name: Joann King MRN: [id number] DOB: 09-12-1990  Today's TOC FU Call Status: Today's TOC FU Call Status:: Unsuccessful Call (2nd Attempt) Unsuccessful Call (1st Attempt) Date: 10/04/22 Unsuccessful Call (2nd Attempt) Date: 10/05/22  Attempted to reach the patient regarding the most recent Inpatient/ED visit.  Follow Up Plan: Additional outreach attempts will be made to reach the patient to complete the Transitions of Care (Post Inpatient/ED visit) call.   Norton Blizzard, [place] (AAMA)  [place] [facility] [phone]

## 2022-10-10 ENCOUNTER — Encounter: Payer: Self-pay | Admitting: Cardiology

## 2022-10-10 ENCOUNTER — Ambulatory Visit: Payer: Medicaid Other | Attending: Cardiology | Admitting: Cardiology

## 2022-10-26 NOTE — Transitions of Care (Post Inpatient/ED Visit) (Signed)
   10/26/2022  Name: Joann King MRN: [id number] DOB: 04-25-1991  Today's TOC FU Call Status: Today's TOC FU Call Status:: Unsuccessful Call (2nd Attempt) Unsuccessful Call (1st Attempt) Date: 10/04/22 Unsuccessful Call (2nd Attempt) Date: 10/05/22  Attempted to reach the patient regarding the most recent Inpatient/ED visit.  Follow Up Plan: No further outreach attempts will be made at this time. We have been unable to contact the patient.  Signature Agnes Lawrence, CMA (AAMA)  [facility] [phone]

## 2022-10-30 ENCOUNTER — Encounter: Payer: Self-pay | Admitting: Advanced Practice Midwife

## 2022-10-30 ENCOUNTER — Other Ambulatory Visit: Payer: Self-pay

## 2022-10-30 DIAGNOSIS — N898 Other specified noninflammatory disorders of vagina: Secondary | ICD-10-CM

## 2022-10-30 MED ORDER — METRONIDAZOLE 500 MG PO TABS: 500.0000 mg | ORAL_TABLET | Freq: Two times a day (BID) | ORAL | 0 refills | Status: DC

## 2022-10-30 NOTE — Progress Notes (Signed)
Rx sent per protocol for BV Pt still would like STD screening/Self swab.  Appt Scheduled tomorrow.

## 2022-10-31 ENCOUNTER — Ambulatory Visit: Payer: Medicaid Other

## 2022-11-02 ENCOUNTER — Ambulatory Visit (INDEPENDENT_AMBULATORY_CARE_PROVIDER_SITE_OTHER): Payer: Medicaid Other

## 2022-11-02 ENCOUNTER — Other Ambulatory Visit (HOSPITAL_COMMUNITY)
Admission: RE | Admit: 2022-11-02 | Discharge: 2022-11-02 | Disposition: A | Discharge status: Home / Self Care | Payer: Medicaid Other | Source: Ambulatory Visit | Attending: Obstetrics & Gynecology | Admitting: Obstetrics & Gynecology

## 2022-11-02 VITALS — BP 128/94 | HR 79

## 2022-11-02 DIAGNOSIS — N898 Other specified noninflammatory disorders of vagina: Secondary | ICD-10-CM | POA: Diagnosis present

## 2022-11-02 NOTE — Progress Notes (Signed)
SUBJECTIVE:  32 y.o. female complains of vaginal discharge/odor  . Denies abnormal vaginal bleeding or significant pelvic pain or fever. No UTI symptoms. Denies history of known exposure to STD.  Patient's last menstrual period was 10/03/2022 (exact date).  OBJECTIVE:  She appears well, afebrile. Urine dipstick: not done.  ASSESSMENT:  Vaginal Discharge  Vaginal Odor   PLAN:  GC, chlamydia, trichomonas, BVAG, CVAG probe sent to lab. Treatment: To be determined once lab results are received ROV prn if symptoms persist or worsen.

## 2022-11-02 NOTE — Progress Notes (Signed)
ATTESTATION OF SUPERVISION OF RN: Evaluation and management procedures were performed by the RN under my supervision and collaboration. I have reviewed the nursing note and chart and agree with the management and plan for this patient.  Macrae Wiegman, CNM  

## 2022-11-03 ENCOUNTER — Other Ambulatory Visit: Payer: Self-pay | Admitting: Advanced Practice Midwife

## 2022-11-03 LAB — CERVICOVAGINAL ANCILLARY ONLY
Bacterial Vaginitis (gardnerella): POSITIVE — AB
Candida Glabrata: NEGATIVE
Candida Vaginitis: NEGATIVE
Chlamydia: NEGATIVE
Comment: NEGATIVE
Comment: NEGATIVE
Comment: NEGATIVE
Comment: NEGATIVE
Comment: NEGATIVE
Comment: NORMAL
Neisseria Gonorrhea: NEGATIVE
Trichomonas: NEGATIVE

## 2022-11-03 NOTE — Progress Notes (Signed)
Swab positive for BV (collected yesterday). Flagyl sent 10/30/2022. Will message patient via MyChart.  Clayton Bibles, MSA, MSN, CNM Certified Nurse Midwife, Biochemist, clinical for [employer], [facility]

## 2022-11-07 ENCOUNTER — Telehealth: Payer: Self-pay

## 2022-11-07 NOTE — Telephone Encounter (Signed)
Called pt to notify her of Sam's recommendations for appt scheduled on Thur.

## 2022-11-09 ENCOUNTER — Ambulatory Visit: Payer: Medicaid Other

## 2022-11-09 ENCOUNTER — Ambulatory Visit: Payer: Medicaid Other | Admitting: Advanced Practice Midwife

## 2022-11-15 NOTE — Telephone Encounter (Signed)
Please be on the lookout for access nurse note. Thanks.

## 2022-11-15 NOTE — Telephone Encounter (Signed)
Called and spoke with patient, she states this is a new onset of symptoms for a few days. The chest pain she feels is left sided when lying down.  Transferred patient to access nurse due to chest pain.

## 2022-11-16 ENCOUNTER — Ambulatory Visit: Payer: Medicaid Other | Admitting: Family Medicine

## 2022-11-16 NOTE — Telephone Encounter (Signed)
Noted  

## 2022-11-16 NOTE — Telephone Encounter (Signed)
Called to follow up on pt.  She did not go to ED as instructed by Access Nurse Team. Pt feels that her chest pain is heart burn. She hasn't really eaten in the past 3 days and feels that hearburn has improved. Scheduled an appointment with Dr. Ermalene Searing at 2pm today.

## 2022-11-20 ENCOUNTER — Other Ambulatory Visit: Payer: Self-pay | Admitting: *Deleted

## 2022-11-20 MED ORDER — METRONIDAZOLE 0.75 % VA GEL: 1.0000 | Freq: Every day | VAGINAL | 1 refills | Status: DC

## 2022-11-22 ENCOUNTER — Ambulatory Visit: Payer: Medicaid Other | Admitting: Primary Care

## 2022-12-14 ENCOUNTER — Encounter: Payer: Self-pay | Admitting: Cardiology

## 2022-12-14 ENCOUNTER — Ambulatory Visit: Payer: Medicaid Other

## 2022-12-14 ENCOUNTER — Ambulatory Visit: Payer: Medicaid Other | Attending: Cardiology | Admitting: Cardiology

## 2022-12-14 VITALS — BP 122/86 | HR 72 | Ht 65.0 in | Wt 258.8 lb

## 2022-12-14 DIAGNOSIS — R002 Palpitations: Secondary | ICD-10-CM

## 2022-12-14 DIAGNOSIS — R0683 Snoring: Secondary | ICD-10-CM

## 2022-12-14 DIAGNOSIS — R072 Precordial pain: Secondary | ICD-10-CM

## 2022-12-14 MED ORDER — OMEPRAZOLE 20 MG PO CPDR: 20.0000 mg | DELAYED_RELEASE_CAPSULE | Freq: Every day | ORAL | 11 refills | Status: AC
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
## 2022-12-14 NOTE — Patient Instructions (Signed)
Medication Instructions:   START Prilosec - Take one tablet (20mg ) by mouth daily.   *If you need a refill on your cardiac medications before your next appointment, please call your pharmacy*   Lab Work:  None Ordered  If you have labs (blood work) drawn today and your tests are completely normal, you will receive your results only by: MyChart Message (if you have MyChart) OR A paper copy in the mail If you have any lab test that is abnormal or we need to change your treatment, we will call you to review the results.   Testing/Procedures:  Your physician has requested that you have an echocardiogram. Echocardiography is a painless test that uses sound waves to create images of your heart. It provides your doctor with information about the size and shape of your heart and how well your heart's chambers and valves are working. This procedure takes approximately one hour. There are no restrictions for this procedure. Please do NOT wear cologne, perfume, aftershave, or lotions (deodorant is allowed). Please arrive 15 minutes prior to your appointment time.  Your physician has recommended that you wear a Zio monitor.   This monitor is a medical device that records the heart's electrical activity. Doctors most often use these monitors to diagnose arrhythmias. Arrhythmias are problems with the speed or rhythm of the heartbeat. The monitor is a small device applied to your chest. You can wear one while you do your normal daily activities. While wearing this monitor if you have any symptoms to push the button and record what you felt. Once you have worn this monitor for the period of time provider prescribed (Usually 14 days), you will return the monitor device in the postage paid box. Once it is returned they will download the data collected and provide [place] with a report which the provider will then review and we will call you with those results. Important tips:  Avoid showering during the first 24  hours of wearing the monitor. Avoid excessive sweating to help maximize wear time. Do not submerge the device, no hot tubs, and no swimming pools. Keep any lotions or oils away from the patch. After 24 hours you may shower with the patch on. Take brief showers with your back facing the shower head.  Do not remove patch once it has been placed because that will interrupt data and decrease adhesive wear time. Push the button when you have any symptoms and write down what you were feeling. Once you have completed wearing your monitor, remove and place into box which has postage paid and place in your outgoing mailbox.  If for some reason you have misplaced your box then call our office and we can provide another box and/or mail it off for you.     Follow-Up: At [facility], you and your health needs are our priority.  As part of our continuing mission to provide you with exceptional heart care, we have created designated Provider Care Teams.  These Care Teams include your primary Cardiologist (physician) and Advanced Practice Providers (APPs -  Physician Assistants and Nurse Practitioners) who all work together to provide you with the care you need, when you need it.  We recommend signing up for the patient portal called "MyChart".  Sign up information is provided on this After Visit Summary.  MyChart is used to connect with patients for Virtual Visits (Telemedicine).  Patients are able to view lab/test results, encounter notes, upcoming appointments, etc.  Non-urgent messages can be  sent to your provider as well.   To learn more about what you can do with MyChart, go to [email or web address].    Your next appointment:    After testing  Provider:   You may see Debbe Odea, MD or one of the following Advanced Practice Providers on your designated Care Team:   Nicolasa Ducking, NP Eula Listen, PA-C Cadence Fransico Michael, PA-C Charlsie Quest, NP

## 2022-12-19 NOTE — Telephone Encounter (Signed)
Can we get her scheduled? 

## 2022-12-21 ENCOUNTER — Encounter: Payer: Self-pay | Admitting: Cardiology

## 2022-12-28 ENCOUNTER — Ambulatory Visit: Payer: Medicaid Other | Attending: Cardiology

## 2022-12-30 ENCOUNTER — Emergency Department: Payer: Medicaid Other

## 2022-12-30 ENCOUNTER — Emergency Department
Admission: EM | Admit: 2022-12-30 | Discharge: 2022-12-30 | Disposition: A | Discharge status: Home / Self Care | Payer: Medicaid Other | Attending: Emergency Medicine | Admitting: Emergency Medicine

## 2022-12-30 ENCOUNTER — Other Ambulatory Visit: Payer: Self-pay

## 2022-12-30 DIAGNOSIS — I10 Essential (primary) hypertension: Secondary | ICD-10-CM | POA: Insufficient documentation

## 2022-12-30 DIAGNOSIS — S0992XA Unspecified injury of nose, initial encounter: Secondary | ICD-10-CM | POA: Diagnosis present

## 2022-12-30 DIAGNOSIS — S022XXA Fracture of nasal bones, initial encounter for closed fracture: Secondary | ICD-10-CM | POA: Diagnosis not present

## 2022-12-30 MED ORDER — IBUPROFEN 600 MG PO TABS
600.0000 mg | ORAL_TABLET | Freq: Once | ORAL | Status: AC
  Administered 2022-12-30: 600 mg via ORAL
  Filled 2022-12-30: qty 1
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
## 2022-12-30 NOTE — Discharge Instructions (Addendum)
Apply ice to the face.  Tylenol and ibuprofen for pain as needed.  Do not blow your nose hard as this will cause more bleeding.  You do have a fracture of the nasal bone but none of the other bones in your face are broken.  Return emergency department if worsening

## 2022-12-30 NOTE — ED Notes (Signed)
Pt no longer in room, have waited 15 min to see if pt was in bathroom. Pt has still not returned. Assuming left before given DC papers.
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
## 2023-01-12 ENCOUNTER — Ambulatory Visit: Payer: Medicaid Other | Admitting: Cardiology

## 2023-01-15 ENCOUNTER — Telehealth: Payer: Self-pay | Admitting: Cardiology

## 2023-01-15 NOTE — Telephone Encounter (Signed)
Email notification received from [employer] stating that patient's ZIO XT, ordered by Dr. Azucena Cecil on 12/14/22 is still pending return.   Serial #  [id number]  Will forward to Dr. Merita Norton covering CMA as an FYI and to follow up with the patient.

## 2023-01-15 NOTE — Telephone Encounter (Signed)
Sent patient a mychart message regarding patients zio monitor. Awaiting response back.

## 2023-01-19 ENCOUNTER — Ambulatory Visit: Payer: Medicaid Other | Attending: Cardiology

## 2023-01-30 ENCOUNTER — Ambulatory Visit: Payer: Medicaid Other | Admitting: Medical

## 2023-02-06 ENCOUNTER — Encounter: Payer: Self-pay | Admitting: Obstetrics & Gynecology

## 2023-02-07 ENCOUNTER — Ambulatory Visit (INDEPENDENT_AMBULATORY_CARE_PROVIDER_SITE_OTHER): Payer: Managed Care, Other (non HMO)

## 2023-02-07 ENCOUNTER — Other Ambulatory Visit (HOSPITAL_COMMUNITY)
Admission: RE | Admit: 2023-02-07 | Discharge: 2023-02-07 | Disposition: A | Discharge status: Home / Self Care | Payer: Managed Care, Other (non HMO) | Source: Ambulatory Visit | Attending: Family Medicine | Admitting: Family Medicine

## 2023-02-07 VITALS — BP 129/90 | HR 76

## 2023-02-07 DIAGNOSIS — N898 Other specified noninflammatory disorders of vagina: Secondary | ICD-10-CM

## 2023-02-07 NOTE — Progress Notes (Signed)
SUBJECTIVE:  32 y.o. female who desires a STI screen. Notes vaginal discharge,no bleeding or significant pelvic pain. No UTI symptoms. Denies history of known exposure to STD.  No LMP recorded.  OBJECTIVE:  She appears well.   ASSESSMENT:  STI Screen   PLAN:  Pt offered STI blood screening-not indicated GC, chlamydia, and trichomonas probe sent to lab.  Treatment: To be determined once lab results are received.  Pt follow up as needed.

## 2023-02-09 MED ORDER — FLUCONAZOLE 150 MG PO TABS: 150.0000 mg | ORAL_TABLET | Freq: Once | ORAL | 2 refills | Status: AC

## 2023-02-09 MED ORDER — METRONIDAZOLE 500 MG PO TABS: 500.0000 mg | ORAL_TABLET | Freq: Two times a day (BID) | ORAL | 0 refills | Status: AC

## 2023-02-09 NOTE — Addendum Note (Signed)
Addended by: Reva Bores on: 02/09/2023 01:10 PM   Modules accepted: Orders

## 2023-02-20 ENCOUNTER — Ambulatory Visit: Payer: Managed Care, Other (non HMO) | Attending: Cardiology

## 2023-02-20 DIAGNOSIS — R072 Precordial pain: Secondary | ICD-10-CM | POA: Diagnosis not present

## 2023-02-20 DIAGNOSIS — I361 Nonrheumatic tricuspid (valve) insufficiency: Secondary | ICD-10-CM

## 2023-02-20 LAB — ECHOCARDIOGRAM COMPLETE
Area-P 1/2: 4.21 cm2
S' Lateral: 2.9 cm

## 2023-02-27 ENCOUNTER — Ambulatory Visit: Payer: Managed Care, Other (non HMO) | Attending: Cardiology | Admitting: Medical
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
## 2023-02-28 ENCOUNTER — Encounter: Payer: Self-pay | Admitting: Medical

## 2023-05-26 ENCOUNTER — Other Ambulatory Visit: Payer: Self-pay

## 2023-05-26 DIAGNOSIS — Z5321 Procedure and treatment not carried out due to patient leaving prior to being seen by health care provider: Secondary | ICD-10-CM | POA: Diagnosis not present

## 2023-05-26 DIAGNOSIS — R21 Rash and other nonspecific skin eruption: Secondary | ICD-10-CM | POA: Diagnosis present

## 2023-05-26 NOTE — ED Triage Notes (Signed)
Pt reports she developed burning sensation to her lower abdomen where she has her incision from where she got a C-section 7 yrs ago. RN looked at area pt has redness between skin fold and irritation, no rash noted, no drainage. Pt talks in complete sentences no distress noted

## 2023-05-27 ENCOUNTER — Emergency Department
Admission: EM | Admit: 2023-05-27 | Discharge: 2023-05-27 | Discharge status: Against Medical Advice | Payer: Managed Care, Other (non HMO) | Attending: Emergency Medicine | Admitting: Emergency Medicine

## 2023-08-31 ENCOUNTER — Other Ambulatory Visit: Payer: Self-pay

## 2023-08-31 DIAGNOSIS — R1032 Left lower quadrant pain: Secondary | ICD-10-CM | POA: Insufficient documentation

## 2023-08-31 DIAGNOSIS — Z5321 Procedure and treatment not carried out due to patient leaving prior to being seen by health care provider: Secondary | ICD-10-CM | POA: Insufficient documentation

## 2023-08-31 LAB — URINALYSIS, ROUTINE W REFLEX MICROSCOPIC
Bilirubin Urine: NEGATIVE
Glucose, UA: NEGATIVE mg/dL
Hgb urine dipstick: NEGATIVE
Ketones, ur: NEGATIVE mg/dL
Leukocytes,Ua: NEGATIVE
Nitrite: NEGATIVE
Protein, ur: NEGATIVE mg/dL
Specific Gravity, Urine: 1.025 (ref 1.005–1.030)
pH: 6 (ref 5.0–8.0)

## 2023-08-31 LAB — COMPREHENSIVE METABOLIC PANEL
ALT: 17 U/L (ref 0–44)
AST: 22 U/L (ref 15–41)
Albumin: 3.9 g/dL (ref 3.5–5.0)
Alkaline Phosphatase: 69 U/L (ref 38–126)
Anion gap: 9 (ref 5–15)
BUN: 19 mg/dL (ref 6–20)
CO2: 25 mmol/L (ref 22–32)
Calcium: 9.2 mg/dL (ref 8.9–10.3)
Chloride: 105 mmol/L (ref 98–111)
Creatinine, Ser: 0.92 mg/dL (ref 0.44–1.00)
GFR, Estimated: >60 mL/min (ref >60)
Glucose, Bld: 93 mg/dL (ref 70–99)
Potassium: 3.4 mmol/L — ABNORMAL LOW (ref 3.5–5.1)
Sodium: 139 mmol/L (ref 135–145)
Total Bilirubin: 0.5 mg/dL (ref 0.0–1.2)
Total Protein: 7.4 g/dL (ref 6.5–8.1)

## 2023-08-31 LAB — CBC
HCT: 34.6 % — ABNORMAL LOW (ref 36.0–46.0)
Hemoglobin: 11.1 g/dL — ABNORMAL LOW (ref 12.0–15.0)
MCH: 28.9 pg (ref 26.0–34.0)
MCHC: 32.1 g/dL (ref 30.0–36.0)
MCV: 90.1 fL (ref 80.0–100.0)
Platelets: 381 10*3/uL (ref 150–400)
RBC: 3.84 MIL/uL — ABNORMAL LOW (ref 3.87–5.11)
RDW: 14.6 % (ref 11.5–15.5)
WBC: 7.3 10*3/uL (ref 4.0–10.5)
nRBC: 0 % (ref 0.0–0.2)

## 2023-08-31 LAB — LIPASE, BLOOD: Lipase: 34 U/L (ref 11–51)

## 2023-08-31 LAB — POC URINE PREG, ED: Preg Test, Ur: NEGATIVE

## 2023-08-31 NOTE — ED Triage Notes (Signed)
 Patient presents to ed with LLQ abdominal pain for three days,  denies nausea vomiting and diarrhea.

## 2023-09-01 ENCOUNTER — Emergency Department
Admission: EM | Admit: 2023-09-01 | Discharge: 2023-09-01 | Discharge status: Against Medical Advice | Payer: Medicaid Other | Attending: Emergency Medicine | Admitting: Emergency Medicine

## 2023-09-05 ENCOUNTER — Ambulatory Visit

## 2023-09-06 ENCOUNTER — Other Ambulatory Visit (HOSPITAL_COMMUNITY)
Admission: RE | Admit: 2023-09-06 | Discharge: 2023-09-06 | Disposition: A | Discharge status: Home / Self Care | Source: Ambulatory Visit | Attending: Obstetrics and Gynecology | Admitting: Obstetrics and Gynecology

## 2023-09-06 ENCOUNTER — Ambulatory Visit (INDEPENDENT_AMBULATORY_CARE_PROVIDER_SITE_OTHER)

## 2023-09-06 DIAGNOSIS — N898 Other specified noninflammatory disorders of vagina: Secondary | ICD-10-CM

## 2023-09-06 NOTE — Progress Notes (Signed)
 SUBJECTIVE:  33 y.o. female complains of  vaginal odor. Denies abnormal vaginal bleeding or significant pelvic pain or fever. No UTI symptoms. Denies history of known exposure to STD.  Patient's last menstrual period was 08/30/2023.  OBJECTIVE:  She appears well, afebrile. Urine dipstick: not done.  ASSESSMENT:  Vaginal Discharge  Vaginal Odor   PLAN:  GC, chlamydia, trichomonas, BVAG, CVAG probe sent to lab. Treatment: To be determined once lab results are received ROV prn if symptoms persist or worsen.

## 2023-09-07 LAB — CERVICOVAGINAL ANCILLARY ONLY
Bacterial Vaginitis (gardnerella): POSITIVE — AB
Candida Glabrata: NEGATIVE
Candida Vaginitis: NEGATIVE
Chlamydia: NEGATIVE
Comment: NEGATIVE
Comment: NEGATIVE
Comment: NEGATIVE
Comment: NEGATIVE
Comment: NEGATIVE
Comment: NORMAL
Neisseria Gonorrhea: NEGATIVE
Trichomonas: NEGATIVE

## 2023-09-10 ENCOUNTER — Encounter: Payer: Self-pay | Admitting: Obstetrics and Gynecology

## 2023-09-14 ENCOUNTER — Other Ambulatory Visit: Payer: Self-pay | Admitting: *Deleted

## 2023-09-14 MED ORDER — METRONIDAZOLE 0.75 % VA GEL: 1.0000 | Freq: Every day | VAGINAL | 1 refills | Status: DC

## 2023-10-25 ENCOUNTER — Ambulatory Visit: Payer: Self-pay

## 2023-10-25 ENCOUNTER — Encounter: Payer: Self-pay | Admitting: Obstetrics & Gynecology

## 2023-10-25 NOTE — Telephone Encounter (Signed)
 Noted, will evaluate.

## 2023-10-25 NOTE — Telephone Encounter (Signed)
 Chief Complaint: Flank pain  Symptoms: Right side flank pain, nausea, abdominal pain, lower back pain Frequency: Constant, onset Sunday  Pertinent Negatives: Patient denies fever, vomiting, constipation Disposition: [] ED /[] Urgent Care (no appt availability in office) / [x] Appointment(In office/virtual)/ []  [place] [facility]/ [] Home Care/ [] Refused Recommended Disposition /[] [place] [facility]/ []  Follow-up with PCP Additional Notes: Patient states she started having right sided flank pain on Sunday and it has gotten worse since than. Patient states she has pain on the right side radiating to the abdomen and lower back. Care advice was given and patient has been scheduled for an appointment with PCP tomorrow afternoon per patient's request.  Copied from CRM [id number]. Topic: Clinical - Red Word Triage >> Oct 25, 2023  8:48 AM Kita Perish H wrote: [facility] that prompted transfer to Nurse Triage: Diverticulitis flare up pain on whole right side, started at hip area now in abdomen and lower back Reason for Disposition  MODERATE pain (e.g., interferes with normal activities or awakens from sleep)  Answer Assessment - Initial Assessment Questions 1. LOCATION: "Where does it hurt?" (e.g., left, right)     Right side  2. ONSET: "When did the pain start?"     Sunday  3. SEVERITY: "How bad is the pain?" (e.g., Scale 1-10; mild, moderate, or severe)   - MILD (1-3): doesn't interfere with normal activities    - MODERATE (4-7): interferes with normal activities or awakens from sleep    - SEVERE (8-10): excruciating pain and patient unable to do normal activities (stays in bed)       8/10 4. PATTERN: "Does the pain come and go, or is it constant?"      Constant 5. CAUSE: "What do you think is causing the pain?"     I think Diverticulitis  6. OTHER SYMPTOMS:  "Do you have any other symptoms?" (e.g., fever, abdomen pain, vomiting, leg weakness, burning with urination, blood in urine)     Abdomen  pain, lower back pain, blood in stool, nausea  7. PREGNANCY:  "Is there any chance you are pregnant?" "When was your last menstrual period?"     No  Protocols used: Flank Pain-A-AH

## 2023-10-26 ENCOUNTER — Encounter: Payer: Self-pay | Admitting: Primary Care

## 2023-10-26 ENCOUNTER — Ambulatory Visit (INDEPENDENT_AMBULATORY_CARE_PROVIDER_SITE_OTHER): Admitting: Primary Care

## 2023-10-26 VITALS — BP 118/78 | HR 70 | Temp 97.5°F | Ht 67.0 in | Wt 254.0 lb

## 2023-10-26 DIAGNOSIS — R102 Pelvic and perineal pain: Secondary | ICD-10-CM | POA: Diagnosis not present

## 2023-10-26 DIAGNOSIS — R109 Unspecified abdominal pain: Secondary | ICD-10-CM | POA: Diagnosis not present

## 2023-10-26 LAB — POC URINALSYSI DIPSTICK (AUTOMATED)
Bilirubin, UA: NEGATIVE
Blood, UA: NEGATIVE
Glucose, UA: NEGATIVE
Ketones, UA: NEGATIVE
Nitrite, UA: NEGATIVE
Protein, UA: POSITIVE — AB
Spec Grav, UA: 1.02 (ref 1.010–1.025)
Urobilinogen, UA: 0.2 U/dL
pH, UA: 6 (ref 5.0–8.0)

## 2023-10-26 MED ORDER — PREDNISONE 20 MG PO TABS: ORAL_TABLET | ORAL | 0 refills | Status: DC

## 2023-10-26 NOTE — Patient Instructions (Signed)
 Medications:  START prednisone  2 tablets daily in the morning for 5 days.   Labs:  CBC and BMET today   Imaging:  You will get a call to schedule your ultrasound test.

## 2023-10-26 NOTE — Assessment & Plan Note (Addendum)
 Etiology unclear.  No alarm signs on exam.  Differentials: constipation, ovarian cyst, cystitis  Reviewed CT abd/pelv (2023) - mild colonic diverticulosis   UA today with 1+ leukocytes, negative nitrites, negative blood, +/- protein.  Urine culture ordered, pending.   Labs: BMET, CBC w/diff  Imaging: US  pelvic complete with transvaginal   Medications:  Rx for prednisone  20mg  - take 2 tabs PO every day x5 days  Offered Toradol  IM - declines today   Will follow with results and further recommendations  I evaluated patient, was consulted regarding treatment, and agree with assessment and plan per Felipe Horton, MSN, FNP student.   Aneta Bar, NP-C
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
## 2023-10-26 NOTE — Progress Notes (Signed)
 Acute Office Visit  Subjective:     Patient ID: Joann King, female    DOB: 1991-03-05, 33 y.o.   MRN: [id number]  Chief Complaint  Patient presents with   Flank Pain    X 5 days     HPI Joann King is a 33 year old female with history of hypertension, GAD, MDD who presents today to discuss 5 day history of right pelvic, right flank and right lumbar back pain. Describes pain as numbness with sharp pains every few hours. Ibuprofen  temporarily relieves pain. Pain is 10 of 10 at worst. Pain is not triggered by position change or PO intake.   Noted constipation 5 days ago with symptom onset; constipation has resolved with dietary modifications. Has BM daily with loose stools, no diarrhea. No N/V. No appetite change. Denies dysuria, frequency, urgency, hematuria. Denies vaginal discharge, itching. Denies fevers, chills, N/V.  H/O tubal ligation LMP: 10/14/23   Review of Systems  Constitutional:  Negative for chills and fever.  Gastrointestinal:  Negative for constipation, diarrhea and vomiting.  Genitourinary:  Positive for flank pain. Negative for dysuria, frequency, hematuria and urgency.       Objective:    BP 118/78   Pulse 70   Temp (!) 97.5 F (36.4 C) (Oral)   Ht 5\' 7"  (1.702 m)   Wt 115.2 kg   SpO2 97%   BMI 39.78 kg/m    Physical Exam Constitutional:      Appearance: Normal appearance.  Cardiovascular:     Rate and Rhythm: Normal rate and regular rhythm.  Pulmonary:     Effort: Pulmonary effort is normal.     Breath sounds: Normal breath sounds.  Abdominal:     General: Abdomen is flat.     Palpations: Abdomen is soft.     Tenderness: There is no right CVA tenderness or left CVA tenderness.       Comments: Tenderness to right pelvic region  Musculoskeletal:        General: Normal range of motion.  Neurological:     Mental Status: She is alert and oriented to person, place, and time.  Psychiatric:        Mood and Affect: Mood normal.        Behavior:  Behavior normal.    Results for orders placed or performed in visit on 10/26/23  POCT Urinalysis Dipstick (Automated)  Result Value Ref Range   Color, UA Yellow    Clarity, UA Cloudy    Glucose, UA Negative Negative   Bilirubin, UA neg    Ketones, UA neg    Spec Grav, UA 1.020 1.010 - 1.025   Blood, UA neg    pH, UA 6.0 5.0 - 8.0   Protein, UA Positive (A) Negative   Urobilinogen, UA 0.2 0.2 or 1.0 E.U./dL   Nitrite, UA neg    Leukocytes, UA Small (1+) (A) Negative        Assessment & Plan:   Problem List Items Addressed This Visit       Other   Right pelvic pain in female - Primary   Etiology unclear.  No alarm signs on exam.  Differentials: constipation, ovarian cyst, cystitis  Reviewed CT abd/pelv (2023) - mild colonic diverticulosis   UA today with 1+ leukocytes, negative nitrites, negative blood, +/- protein.  Urine culture ordered, pending.   Labs: BMET, CBC w/diff  Imaging: US  pelvic complete with transvaginal   Medications:  Rx for prednisone  20mg  - take 2 tabs PO  every day x5 days for numbness Offered Toradol  IM - declines today   Will follow with results and further recommendations       Relevant Medications   predniSONE  (DELTASONE ) 20 MG tablet   Other Relevant Orders   Basic Metabolic Panel   US  Pelvic Complete With Transvaginal   CBC with Differential/Platelet   Other Visit Diagnoses       Flank pain       Relevant Orders   POCT Urinalysis Dipstick (Automated) (Completed)   Urine Culture       Meds ordered this encounter  Medications   predniSONE  (DELTASONE ) 20 MG tablet    Sig: Take 2 tablets by mouth daily in the morning for 5 days.    Dispense:  10 tablet    Refill:  0    Follow up as needed based on symptoms.   Shavona Gunderman M Theodore Rahrig, RN

## 2023-10-27 LAB — CBC WITH DIFFERENTIAL/PLATELET
Absolute Lymphocytes: 2970 {cells}/uL (ref 850–3900)
Absolute Monocytes: 301 {cells}/uL (ref 200–950)
Basophils Absolute: 19 {cells}/uL (ref 0–200)
Basophils Relative: 0.3 %
Eosinophils Absolute: 51 {cells}/uL (ref 15–500)
Eosinophils Relative: 0.8 %
HCT: 35 % (ref 35.0–45.0)
Hemoglobin: 11.3 g/dL — ABNORMAL LOW (ref 11.7–15.5)
MCH: 28.2 pg (ref 27.0–33.0)
MCHC: 32.3 g/dL (ref 32.0–36.0)
MCV: 87.3 fL (ref 80.0–100.0)
MPV: 10 fL (ref 7.5–12.5)
Monocytes Relative: 4.7 %
Neutro Abs: 3059 {cells}/uL (ref 1500–7800)
Neutrophils Relative %: 47.8 %
Platelets: 397 10*3/uL (ref 140–400)
RBC: 4.01 10*6/uL (ref 3.80–5.10)
RDW: 13.5 % (ref 11.0–15.0)
Total Lymphocyte: 46.4 %
WBC: 6.4 10*3/uL (ref 3.8–10.8)

## 2023-10-27 LAB — BASIC METABOLIC PANEL WITH GFR
BUN: 9 mg/dL (ref 7–25)
CO2: 24 mmol/L (ref 20–32)
Calcium: 8.9 mg/dL (ref 8.6–10.2)
Chloride: 106 mmol/L (ref 98–110)
Creat: 0.75 mg/dL (ref 0.50–0.97)
Glucose, Bld: 74 mg/dL (ref 65–99)
Potassium: 3.9 mmol/L (ref 3.5–5.3)
Sodium: 138 mmol/L (ref 135–146)
eGFR: 108 mL/min/{1.73_m2} (ref >=60)

## 2023-10-27 LAB — URINE CULTURE
MICRO NUMBER:: 16376616
Result:: NO GROWTH
SPECIMEN QUALITY:: ADEQUATE

## 2023-10-30 ENCOUNTER — Ambulatory Visit
Admission: RE | Admit: 2023-10-30 | Discharge: 2023-10-30 | Disposition: A | Discharge status: Home / Self Care | Source: Ambulatory Visit | Attending: Primary Care | Admitting: Primary Care

## 2023-10-30 DIAGNOSIS — R102 Pelvic and perineal pain: Secondary | ICD-10-CM | POA: Diagnosis present

## 2023-12-26 ENCOUNTER — Ambulatory Visit: Admitting: Primary Care

## 2023-12-28 ENCOUNTER — Ambulatory Visit: Admitting: Primary Care

## 2024-01-03 ENCOUNTER — Other Ambulatory Visit: Payer: Self-pay | Admitting: *Deleted

## 2024-01-03 MED ORDER — METRONIDAZOLE 500 MG PO TABS: 500.0000 mg | ORAL_TABLET | Freq: Two times a day (BID) | ORAL | 0 refills | Status: AC

## 2024-01-17 ENCOUNTER — Other Ambulatory Visit: Payer: Self-pay

## 2024-01-17 ENCOUNTER — Emergency Department
Admission: EM | Admit: 2024-01-17 | Discharge: 2024-01-17 | Discharge status: Against Medical Advice | Attending: Emergency Medicine | Admitting: Emergency Medicine

## 2024-01-17 DIAGNOSIS — M545 Low back pain, unspecified: Secondary | ICD-10-CM | POA: Diagnosis present

## 2024-01-17 DIAGNOSIS — Z5321 Procedure and treatment not carried out due to patient leaving prior to being seen by health care provider: Secondary | ICD-10-CM | POA: Insufficient documentation

## 2024-01-17 NOTE — ED Notes (Signed)
 Patient notified this RN that she needed to leave to go be with her kids. Patient states that she'll just make an appointment with her Primary Care Dr. Patient is alert and oriented x 4, with steady gate, and in no apparent distress.

## 2024-01-17 NOTE — ED Triage Notes (Signed)
 Patient C/O right lower back pain that shoots down to her right hip. Patient states that this has been going on for a long time, and has attempted to be seen here, but hasn't been because of long wait times. Patient denies any injury, and denies any urinary, flank or abdominal pain.

## 2024-01-17 NOTE — ED Notes (Addendum)
 Pt states she has been having some right sided lower back pain that has been going on for a few months.  Pt states pain radiates down her right leg.   Denies any numbness or inability to bear weight on that leg.

## 2024-01-21 ENCOUNTER — Ambulatory Visit: Admission: EM | Admit: 2024-01-21 | Discharge: 2024-01-21 | Discharge status: Against Medical Advice

## 2024-01-22 ENCOUNTER — Encounter: Payer: Self-pay | Admitting: Primary Care

## 2024-01-22 ENCOUNTER — Ambulatory Visit
Admission: RE | Admit: 2024-01-22 | Discharge: 2024-01-22 | Disposition: A | Discharge status: Home / Self Care | Source: Ambulatory Visit | Attending: Primary Care

## 2024-01-22 ENCOUNTER — Ambulatory Visit (INDEPENDENT_AMBULATORY_CARE_PROVIDER_SITE_OTHER): Admitting: Primary Care

## 2024-01-22 VITALS — BP 118/70 | HR 72 | Temp 97.9°F | Ht 66.0 in | Wt 268.0 lb

## 2024-01-22 DIAGNOSIS — M545 Low back pain, unspecified: Secondary | ICD-10-CM

## 2024-01-22 MED ORDER — PREDNISONE 20 MG PO TABS
ORAL_TABLET | ORAL | 0 refills | Status: DC
Start: 2024-01-22 — End: 2024-02-18

## 2024-01-22 MED ORDER — METHYLPREDNISOLONE ACETATE 80 MG/ML IJ SUSP
80.0000 mg | Freq: Once | INTRAMUSCULAR | Status: AC
  Administered 2024-01-22: 80 mg via INTRAMUSCULAR

## 2024-01-22 NOTE — Patient Instructions (Signed)
 Start prednisone  for back pain tomorrow.  Take 3 tablets by mouth in the morning x 3 days, then 2 tablets x 3 days, then 1 tablet x 3 days.  Try to stretch and walk when able.  Please update me if no improvement.   Complete xray(s) prior to leaving today. I will notify you of your results once received.  It was a pleasure to see you today!

## 2024-01-22 NOTE — Assessment & Plan Note (Signed)
 Exam today suggestive of SI joint inflammation/involvement. No alarm signs.  We discussed the importance of increased mobility during her workday, increased physical activity during days off.  Xray of the lumbar spine ordered and pending.  Depo-Medrol  80 mg provided intramuscularly today. Start prednisone  tablets tomorrow. Take 3 tablets by mouth in the morning x 3 days, then 2 tablets x 3 days, then 1 tablet x 3 days.  Work note provided.
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
## 2024-01-23 MED ORDER — CYCLOBENZAPRINE HCL 10 MG PO TABS: 10.0000 mg | ORAL_TABLET | Freq: Three times a day (TID) | ORAL | 0 refills | Status: DC | PRN

## 2024-01-28 ENCOUNTER — Ambulatory Visit: Payer: Self-pay | Admitting: Primary Care

## 2024-01-28 DIAGNOSIS — M545 Low back pain, unspecified: Secondary | ICD-10-CM

## 2024-02-11 NOTE — Telephone Encounter (Signed)
 Kelli, can you update her new phone number?

## 2024-02-12 NOTE — Telephone Encounter (Signed)
 Phone number updated.

## 2024-02-13 MED ORDER — GABAPENTIN 300 MG PO CAPS
300.0000 mg | ORAL_CAPSULE | Freq: Every day | ORAL | 0 refills | Status: AC
Start: 2024-02-13 — End: ?

## 2024-02-18 ENCOUNTER — Other Ambulatory Visit: Payer: Self-pay | Admitting: Primary Care

## 2024-02-18 DIAGNOSIS — M545 Low back pain, unspecified: Secondary | ICD-10-CM

## 2024-02-18 MED ORDER — CYCLOBENZAPRINE HCL 10 MG PO TABS: 10.0000 mg | ORAL_TABLET | Freq: Three times a day (TID) | ORAL | 0 refills | Status: AC | PRN

## 2024-02-18 NOTE — Telephone Encounter (Signed)
 Copied from CRM #[id number]. Topic: Clinical - Medication Refill >> Feb 18, 2024  1:44 PM Pinkey ORN wrote: Medication: cyclobenzaprine  (FLEXERIL ) 10 MG tablet  Has the patient contacted their pharmacy? Yes (Agent: If no, request that the patient contact the pharmacy for the refill. If patient does not wish to contact the pharmacy document the reason why and proceed with request.) (Agent: If yes, when and what did the pharmacy advise?)  This is the patient's preferred pharmacy:   [id number] [address] [facility] - [address] [place] [address] Phone: [phone] Fax: [phone]  Is this the correct pharmacy for this prescription? Yes If no, delete pharmacy and type the correct one.   Has the prescription been filled recently? No  Is the patient out of the medication? Yes  Has the patient been seen for an appointment in the last year OR does the patient have an upcoming appointment? Yes  Can we respond through MyChart? Yes  Agent: Please be advised that Rx refills may take up to 3 business days. We ask that you follow-up with your pharmacy. >> Feb 18, 2024  1:45 PM Pinkey ORN wrote: Patient is completely out of her medication and states she's in severe pain. Patient is requesting this be sent over to the pharmacy today

## 2024-03-26 ENCOUNTER — Other Ambulatory Visit (HOSPITAL_COMMUNITY)
Admission: RE | Admit: 2024-03-26 | Discharge: 2024-03-26 | Disposition: A | Discharge status: Home / Self Care | Source: Ambulatory Visit | Attending: Obstetrics and Gynecology | Admitting: Obstetrics and Gynecology

## 2024-03-26 ENCOUNTER — Ambulatory Visit

## 2024-03-26 DIAGNOSIS — N898 Other specified noninflammatory disorders of vagina: Secondary | ICD-10-CM | POA: Diagnosis present

## 2024-03-26 NOTE — Progress Notes (Signed)
 SUBJECTIVE:  33 y.o. female complains of  vaginal discharge and itching. Denies abnormal vaginal bleeding or significant pelvic pain or fever.Denies history of known exposure to STD.  No LMP recorded. (Menstrual status: Bilateral Tubal Ligation).  OBJECTIVE:  She appears alert, well appearing, in no apparent distress Urine dipstick: not done.  ASSESSMENT:  Vaginal Discharge  Vaginal Odor Dysuria    PLAN:  GC, chlamydia, trichomonas, BVAG, CVAG probe and urine culture sent to lab. Treatment: To be determined once lab results are received ROV prn if symptoms persist or worsen.

## 2024-03-27 ENCOUNTER — Ambulatory Visit

## 2024-03-28 LAB — CERVICOVAGINAL ANCILLARY ONLY
Bacterial Vaginitis (gardnerella): POSITIVE — AB
Candida Glabrata: NEGATIVE
Candida Vaginitis: POSITIVE — AB
Chlamydia: NEGATIVE
Comment: NEGATIVE
Comment: NEGATIVE
Comment: NEGATIVE
Comment: NEGATIVE
Comment: NEGATIVE
Comment: NORMAL
Neisseria Gonorrhea: NEGATIVE
Trichomonas: NEGATIVE

## 2024-03-28 LAB — URINE CULTURE

## 2024-04-01 ENCOUNTER — Ambulatory Visit: Payer: Self-pay | Admitting: Obstetrics and Gynecology

## 2024-04-01 MED ORDER — CEFADROXIL 500 MG PO CAPS
500.0000 mg | ORAL_CAPSULE | Freq: Two times a day (BID) | ORAL | 0 refills | Status: AC
Start: 2024-04-01 — End: 2024-04-06

## 2024-04-01 MED ORDER — METRONIDAZOLE 500 MG PO TABS: 500.0000 mg | ORAL_TABLET | Freq: Two times a day (BID) | ORAL | 0 refills | Status: AC

## 2024-04-01 MED ORDER — FLUCONAZOLE 150 MG PO TABS: 150.0000 mg | ORAL_TABLET | ORAL | 0 refills | Status: AC

## 2024-04-08 ENCOUNTER — Ambulatory Visit

## 2024-04-10 ENCOUNTER — Ambulatory Visit: Admitting: Primary Care

## 2024-04-10 ENCOUNTER — Ambulatory Visit

## 2024-04-16 ENCOUNTER — Ambulatory Visit: Admitting: Primary Care

## 2024-04-21 ENCOUNTER — Ambulatory Visit: Admitting: Primary Care

## 2024-04-24 ENCOUNTER — Ambulatory Visit: Admitting: Primary Care

## 2024-06-17 ENCOUNTER — Ambulatory Visit: Payer: Self-pay

## 2024-06-17 NOTE — Telephone Encounter (Signed)
 Noted, will evaluate.

## 2024-06-17 NOTE — Telephone Encounter (Signed)
 FYI Only or Action Required?: FYI only for provider: appointment scheduled on 06/20/24.  Patient was last seen in primary care on 01/22/2024 by Gretta Comer POUR, NP.  Called Nurse Triage reporting Rectal Bleeding.  Symptoms began today.  Interventions attempted: Nothing.  Symptoms are: stable.  Triage Disposition: See PCP When Office is Open (Within 3 Days)  Patient/caregiver understands and will follow disposition?: Yes Reason for Disposition  MILD rectal bleeding (e.g., more than just a few drops or streaks)  Answer Assessment - Initial Assessment Questions Denies pain with using the bathroom.   1. APPEARANCE of BLOOD: What color is it? Is it passed separately, on the surface of the stool, or mixed in with the stool?      Noticed when wiping, bright red  2. FREQUENCY: How many times has blood been passed with the stools?      1 time today  3. ONSET: When was the blood first seen in the stools? (Days or weeks)      Today  4. DIARRHEA: Is there also some diarrhea? If Yes, ask: How many diarrhea stools in the past 24 hours?      Denies  5. CONSTIPATION: Do you have constipation? If Yes, ask: How bad is it?     Denies  6. RECURRENT SYMPTOMS: Have you had blood in your stools before? If Yes, ask: When was the last time? and What happened that time?      Denies  7. BLOOD THINNERS: Do you take any blood thinners? (e.g., aspirin, clopidogrel / Plavix, coumadin, heparin). Notes: Other strong blood thinners include: Arixtra (fondaparinux), Eliquis (apixaban), Pradaxa (dabigatran), and Xarelto (rivaroxaban).     Denies  8. OTHER SYMPTOMS: Do you have any other symptoms?  (e.g., abdomen pain, vomiting, dizziness, fever)     Abdominal pain, left side pain, reports been going on for a long time.  Protocols used: Rectal Bleeding-A-AH  Copied from CRM [id number]. Topic: Clinical - Red Word Triage >> Jun 17, 2024 12:04 PM Alfonso ORN wrote: Red Word that  prompted transfer to Nurse Triage: blood in stool

## 2024-06-20 ENCOUNTER — Ambulatory Visit: Admitting: Primary Care

## 2024-08-08 ENCOUNTER — Telehealth: Admitting: Emergency Medicine

## 2024-08-08 DIAGNOSIS — R102 Pelvic and perineal pain unspecified side: Secondary | ICD-10-CM

## 2024-08-08 NOTE — Progress Notes (Signed)
" °  Because your symptoms don't quite seem like a yeast infection or a UTI, I am concerned something else might be going on. I feel your condition warrants further evaluation and I recommend that you be seen in a face-to-face visit.   NOTE: There will be NO CHARGE for this E-Visit   If you are having a true medical emergency, please call 911.     For an urgent face to face visit, [facility] has multiple urgent care centers for your convenience.  Click the link below for the full list of locations and hours, walk-in wait times, appointment scheduling options and driving directions:  Urgent Care - [place], [place], [place], [place], [place], [place]  [facility]     Your MyChart E-visit questionnaire answers were reviewed by a board certified advanced clinical practitioner to complete your personal care plan based on your specific symptoms.    Thank you for using e-Visits.    "

## 2024-08-19 ENCOUNTER — Ambulatory Visit: Admitting: Obstetrics & Gynecology
